# Patient Record
Sex: Male | Born: 1955 | Race: Black or African American | Hispanic: No | Marital: Single | Smoking: Former smoker
Health system: Southern US, Community
[De-identification: ages and names within clinical notes are randomized; demographics above are authoritative.]

## PROBLEM LIST (undated history)

## (undated) DIAGNOSIS — I10 Essential (primary) hypertension: Secondary | ICD-10-CM

## (undated) DIAGNOSIS — I509 Heart failure, unspecified: Secondary | ICD-10-CM

## (undated) DIAGNOSIS — E119 Type 2 diabetes mellitus without complications: Secondary | ICD-10-CM

## (undated) DIAGNOSIS — I4891 Unspecified atrial fibrillation: Secondary | ICD-10-CM

## (undated) DIAGNOSIS — D869 Sarcoidosis, unspecified: Secondary | ICD-10-CM

## (undated) DIAGNOSIS — D6851 Activated protein C resistance: Secondary | ICD-10-CM

## (undated) DIAGNOSIS — Z950 Presence of cardiac pacemaker: Secondary | ICD-10-CM

## (undated) HISTORY — PX: HERNIA REPAIR: SHX51

## (undated) HISTORY — PX: TRANSURETHRAL RESECTION OF PROSTATE: SHX73

## (undated) HISTORY — PX: CARDIAC SURGERY: SHX584

---

## 2004-07-09 ENCOUNTER — Other Ambulatory Visit: Payer: Self-pay

## 2004-07-09 ENCOUNTER — Emergency Department: Payer: Self-pay | Admitting: Emergency Medicine

## 2005-11-10 ENCOUNTER — Emergency Department: Payer: Self-pay | Admitting: Emergency Medicine

## 2007-05-01 ENCOUNTER — Other Ambulatory Visit: Payer: Self-pay

## 2007-05-01 ENCOUNTER — Inpatient Hospital Stay: Payer: Self-pay | Admitting: Internal Medicine

## 2007-05-10 ENCOUNTER — Inpatient Hospital Stay: Payer: Self-pay | Admitting: Internal Medicine

## 2007-05-10 ENCOUNTER — Other Ambulatory Visit: Payer: Self-pay

## 2007-05-11 ENCOUNTER — Other Ambulatory Visit: Payer: Self-pay

## 2007-05-20 ENCOUNTER — Other Ambulatory Visit: Payer: Self-pay

## 2007-05-20 ENCOUNTER — Emergency Department: Payer: Self-pay | Admitting: Unknown Physician Specialty

## 2007-05-27 ENCOUNTER — Emergency Department (HOSPITAL_COMMUNITY): Admission: EM | Admit: 2007-05-27 | Discharge: 2007-05-27 | Payer: Self-pay | Admitting: Emergency Medicine

## 2007-06-04 ENCOUNTER — Other Ambulatory Visit: Payer: Self-pay

## 2007-06-04 ENCOUNTER — Emergency Department: Payer: Self-pay | Admitting: Emergency Medicine

## 2007-06-22 ENCOUNTER — Ambulatory Visit: Payer: Self-pay | Admitting: Vascular Surgery

## 2007-07-15 ENCOUNTER — Other Ambulatory Visit: Payer: Self-pay

## 2007-07-16 ENCOUNTER — Inpatient Hospital Stay: Payer: Self-pay | Admitting: *Deleted

## 2007-07-29 ENCOUNTER — Emergency Department: Payer: Self-pay | Admitting: Emergency Medicine

## 2007-07-29 ENCOUNTER — Other Ambulatory Visit: Payer: Self-pay

## 2007-08-09 ENCOUNTER — Other Ambulatory Visit: Payer: Self-pay

## 2007-08-09 ENCOUNTER — Emergency Department: Payer: Self-pay | Admitting: Internal Medicine

## 2007-08-13 ENCOUNTER — Ambulatory Visit: Payer: Self-pay | Admitting: Vascular Surgery

## 2007-08-14 ENCOUNTER — Emergency Department: Payer: Self-pay | Admitting: Emergency Medicine

## 2007-08-14 ENCOUNTER — Other Ambulatory Visit: Payer: Self-pay

## 2007-08-17 ENCOUNTER — Inpatient Hospital Stay: Payer: Self-pay | Admitting: Vascular Surgery

## 2007-08-23 ENCOUNTER — Other Ambulatory Visit: Payer: Self-pay

## 2007-08-23 ENCOUNTER — Emergency Department: Payer: Self-pay | Admitting: Emergency Medicine

## 2007-08-31 ENCOUNTER — Ambulatory Visit: Payer: Self-pay | Admitting: Vascular Surgery

## 2007-09-06 ENCOUNTER — Emergency Department: Payer: Self-pay | Admitting: Emergency Medicine

## 2007-09-06 ENCOUNTER — Other Ambulatory Visit: Payer: Self-pay

## 2007-10-31 ENCOUNTER — Inpatient Hospital Stay: Payer: Self-pay | Admitting: Internal Medicine

## 2007-10-31 ENCOUNTER — Other Ambulatory Visit: Payer: Self-pay

## 2007-11-01 ENCOUNTER — Other Ambulatory Visit: Payer: Self-pay

## 2007-12-24 ENCOUNTER — Inpatient Hospital Stay: Payer: Self-pay | Admitting: Internal Medicine

## 2008-01-02 ENCOUNTER — Emergency Department: Payer: Self-pay | Admitting: Emergency Medicine

## 2008-01-04 ENCOUNTER — Emergency Department: Payer: Self-pay | Admitting: Emergency Medicine

## 2008-01-05 ENCOUNTER — Inpatient Hospital Stay: Payer: Self-pay | Admitting: Internal Medicine

## 2008-01-08 ENCOUNTER — Emergency Department: Payer: Self-pay

## 2008-01-20 ENCOUNTER — Emergency Department: Payer: Self-pay | Admitting: Emergency Medicine

## 2008-03-16 ENCOUNTER — Emergency Department: Payer: Self-pay

## 2008-08-22 ENCOUNTER — Inpatient Hospital Stay: Payer: Self-pay | Admitting: Internal Medicine

## 2009-08-02 IMAGING — CR DG CHEST 1V PORT
1 series · 1 of 1 positions shown · non-contrast
Comparison: none

REASON FOR EXAM: cp
COMMENTS:

[view not recorded]
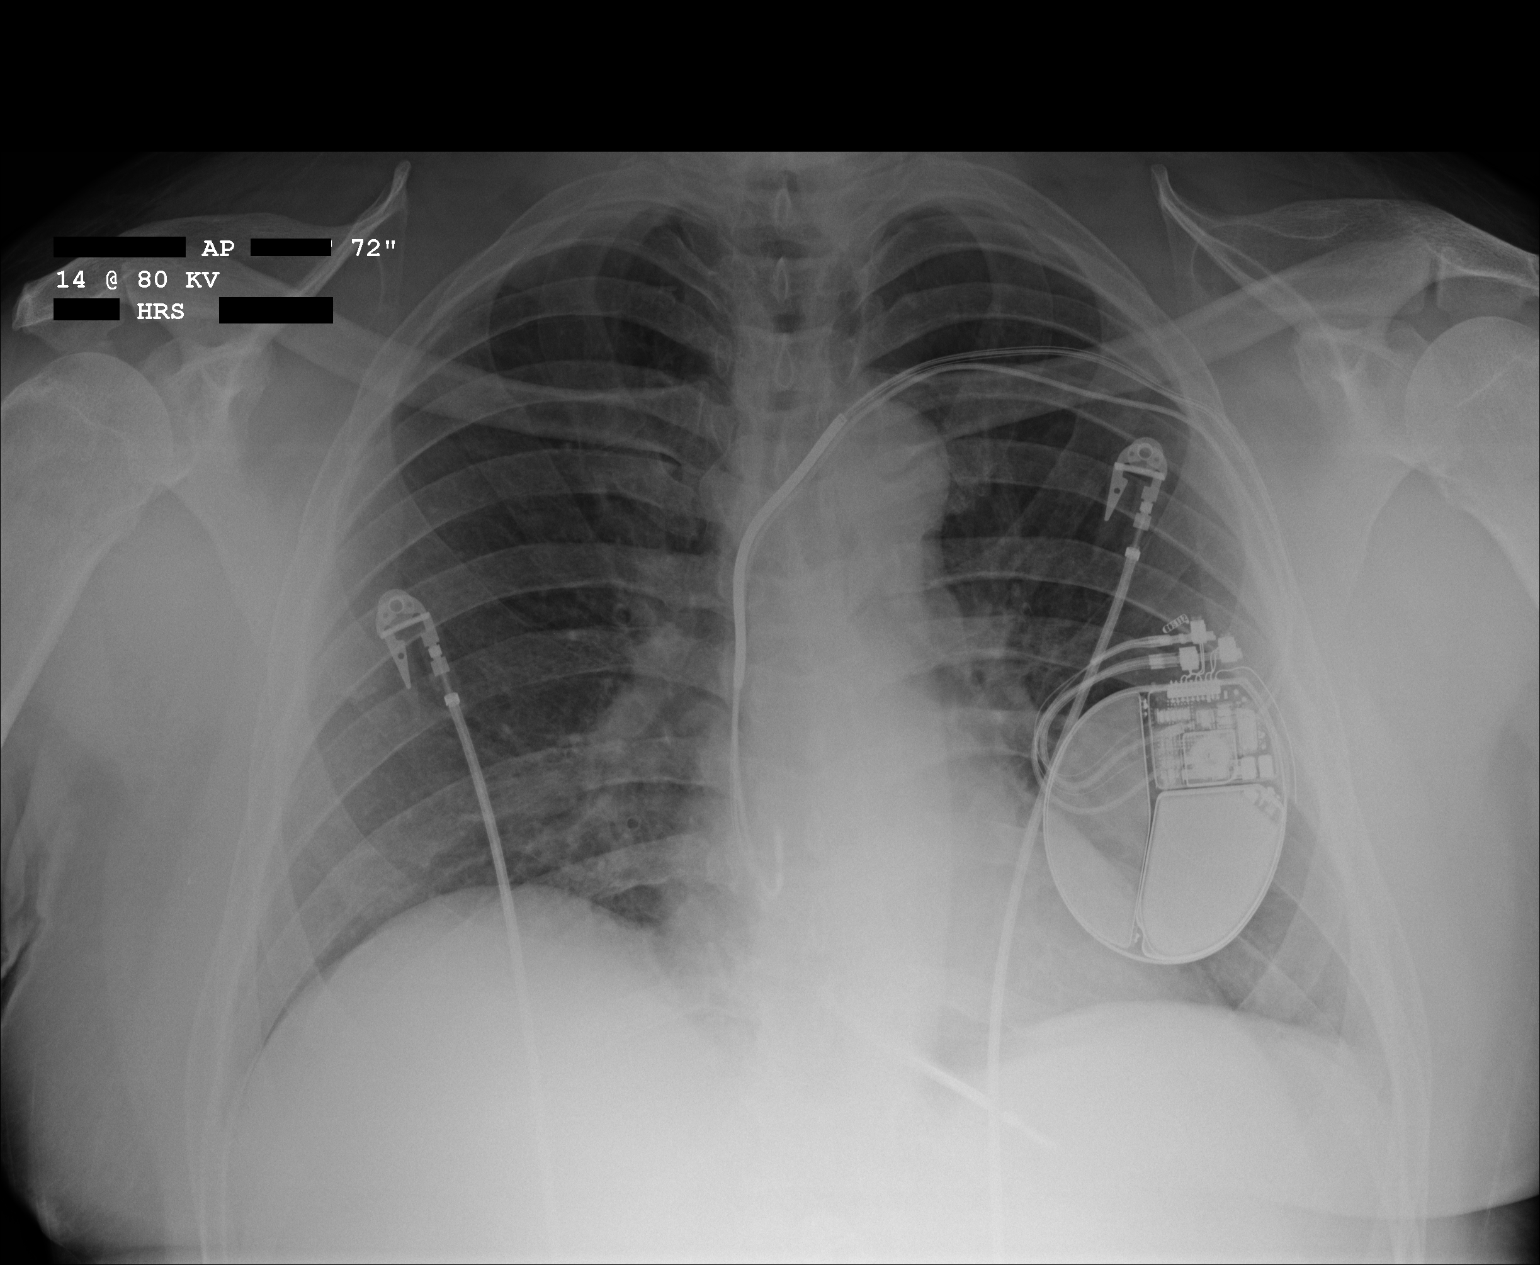

[1 of 1 positions shown; findings below may reference images not displayed]

PROCEDURE:     DXR - DXR PORTABLE CHEST SINGLE VIEW  - August 22, 2008  [DATE]

RESULT:       Comparison is made to a prior study dated 03/16/08.

A left-sided pectoralis pacing unit is appreciated with lead tip projecting
in the region of the right atrium and right ventricle. The cardiac
silhouette is within normal limits.  The visualized bony skeleton is
unremarkable.  There is no evidence of focal infiltrates, effusions or
edema.
IMPRESSION: Chest radiograph without evidence of acute cardiopulmonary disease.

## 2009-09-20 ENCOUNTER — Emergency Department: Payer: Self-pay | Admitting: Emergency Medicine

## 2009-10-15 ENCOUNTER — Inpatient Hospital Stay: Payer: Self-pay | Admitting: Internal Medicine

## 2009-10-24 ENCOUNTER — Emergency Department: Payer: Self-pay | Admitting: Unknown Physician Specialty

## 2009-11-13 ENCOUNTER — Emergency Department: Payer: Self-pay | Admitting: Emergency Medicine

## 2009-11-14 ENCOUNTER — Observation Stay: Payer: Self-pay | Admitting: Vascular Surgery

## 2009-11-25 ENCOUNTER — Emergency Department: Payer: Self-pay | Admitting: Emergency Medicine

## 2010-01-12 ENCOUNTER — Observation Stay: Payer: Self-pay | Admitting: Internal Medicine

## 2010-02-22 ENCOUNTER — Emergency Department: Payer: Self-pay | Admitting: Emergency Medicine

## 2010-02-27 ENCOUNTER — Emergency Department: Payer: Self-pay | Admitting: Emergency Medicine

## 2010-03-06 ENCOUNTER — Emergency Department: Payer: Self-pay | Admitting: Emergency Medicine

## 2010-03-14 ENCOUNTER — Ambulatory Visit: Payer: Self-pay | Admitting: Vascular Surgery

## 2010-03-24 ENCOUNTER — Inpatient Hospital Stay: Payer: Self-pay | Admitting: Internal Medicine

## 2010-04-01 ENCOUNTER — Emergency Department: Payer: Self-pay | Admitting: Emergency Medicine

## 2010-05-08 ENCOUNTER — Emergency Department: Payer: Self-pay

## 2010-05-17 ENCOUNTER — Ambulatory Visit: Payer: Self-pay | Admitting: Vascular Surgery

## 2010-06-14 ENCOUNTER — Inpatient Hospital Stay: Payer: Self-pay | Admitting: Internal Medicine

## 2010-08-04 ENCOUNTER — Observation Stay: Payer: Self-pay | Admitting: Internal Medicine

## 2010-08-20 ENCOUNTER — Emergency Department: Payer: Self-pay | Admitting: Emergency Medicine

## 2010-08-27 ENCOUNTER — Ambulatory Visit: Payer: Self-pay | Admitting: Neurology

## 2010-08-30 ENCOUNTER — Observation Stay: Payer: Self-pay | Admitting: Internal Medicine

## 2010-09-03 ENCOUNTER — Emergency Department: Payer: Self-pay | Admitting: Internal Medicine

## 2010-09-09 ENCOUNTER — Emergency Department: Payer: Self-pay | Admitting: Emergency Medicine

## 2010-09-11 ENCOUNTER — Inpatient Hospital Stay: Payer: Self-pay | Admitting: Student

## 2010-09-26 ENCOUNTER — Observation Stay: Payer: Self-pay | Admitting: Internal Medicine

## 2010-10-20 ENCOUNTER — Emergency Department: Payer: Self-pay | Admitting: Unknown Physician Specialty

## 2010-10-29 LAB — POCT I-STAT, CHEM 8
BUN: 14
Calcium, Ion: 1.19
Chloride: 104
Creatinine, Ser: 1.3
Glucose, Bld: 85
HCT: 42
Hemoglobin: 14.3
Potassium: 3.5
Sodium: 140
TCO2: 26

## 2010-10-29 LAB — CBC
HCT: 40.5
Hemoglobin: 12.7 — ABNORMAL LOW
MCHC: 31.5
MCV: 74.7 — ABNORMAL LOW
Platelets: 303
RBC: 5.42
RDW: 16.1 — ABNORMAL HIGH
WBC: 8.1

## 2010-10-29 LAB — RAPID URINE DRUG SCREEN, HOSP PERFORMED
Amphetamines: NOT DETECTED
Barbiturates: NOT DETECTED
Benzodiazepines: NOT DETECTED
Cocaine: NOT DETECTED
Opiates: NOT DETECTED
Tetrahydrocannabinol: NOT DETECTED

## 2010-10-29 LAB — DIFFERENTIAL
Basophils Absolute: 0.1
Basophils Relative: 2 — ABNORMAL HIGH
Eosinophils Absolute: 0.1
Eosinophils Relative: 1
Lymphocytes Relative: 38
Lymphs Abs: 3.1
Monocytes Absolute: 0.9
Monocytes Relative: 11
Neutro Abs: 3.9
Neutrophils Relative %: 49

## 2010-10-29 LAB — PROTIME-INR
INR: 1.3
Prothrombin Time: 16.2 — ABNORMAL HIGH

## 2010-10-29 LAB — POCT CARDIAC MARKERS
CKMB, poc: 1.2
Myoglobin, poc: 56.6
Operator id: 264031
Troponin i, poc: <0.05

## 2010-10-29 LAB — HEPATIC FUNCTION PANEL
ALT: 21
AST: 23
Albumin: 3.6
Alkaline Phosphatase: 84
Bilirubin, Direct: 0.1
Indirect Bilirubin: 0.3
Total Bilirubin: 0.4
Total Protein: 7.1

## 2010-10-29 LAB — URINALYSIS, ROUTINE W REFLEX MICROSCOPIC
Bilirubin Urine: NEGATIVE
Glucose, UA: NEGATIVE
Hgb urine dipstick: NEGATIVE
Ketones, ur: NEGATIVE
Nitrite: NEGATIVE
Protein, ur: NEGATIVE
Specific Gravity, Urine: 1.01
Urobilinogen, UA: 0.2
pH: 6

## 2010-10-29 LAB — APTT: aPTT: 39 — ABNORMAL HIGH

## 2010-10-29 LAB — LIPASE, BLOOD: Lipase: 39

## 2010-11-13 DIAGNOSIS — Z95810 Presence of automatic (implantable) cardiac defibrillator: Secondary | ICD-10-CM | POA: Insufficient documentation

## 2010-12-19 ENCOUNTER — Inpatient Hospital Stay: Payer: Self-pay | Admitting: Internal Medicine

## 2011-02-02 IMAGING — CR DG TIBIA/FIBULA 2V*L*
1 series · 2 of 2 positions shown · non-contrast
Comparison: None

REASON FOR EXAM: pain
COMMENTS:   May transport without cardiac monitor

PROCEDURE:     DXR - DXR TIBIA AND FIBULA LT (LOWER L  - February 22, 2010  [DATE]
RESULT:     History: Pain

[Series 1: view not recorded · 0.17mm/px · 2 of 2 slices shown]
[im 1/2]
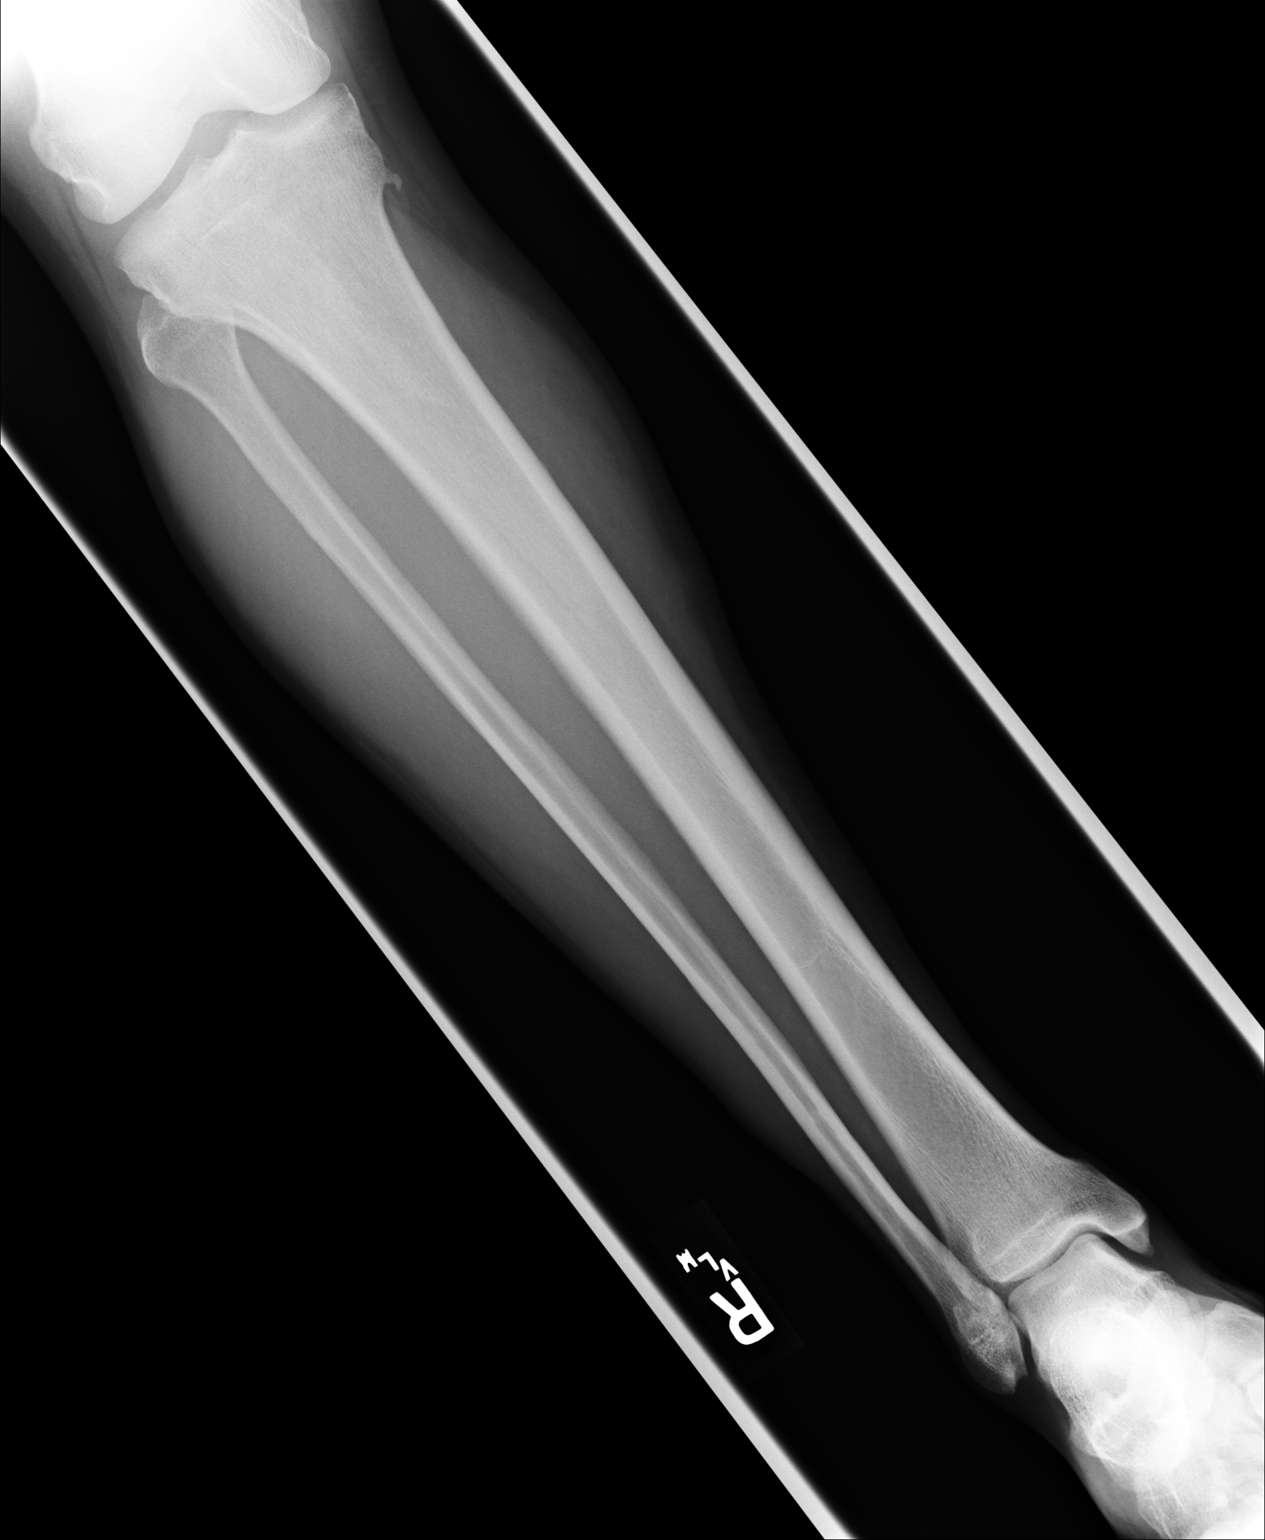
[im 2/2]
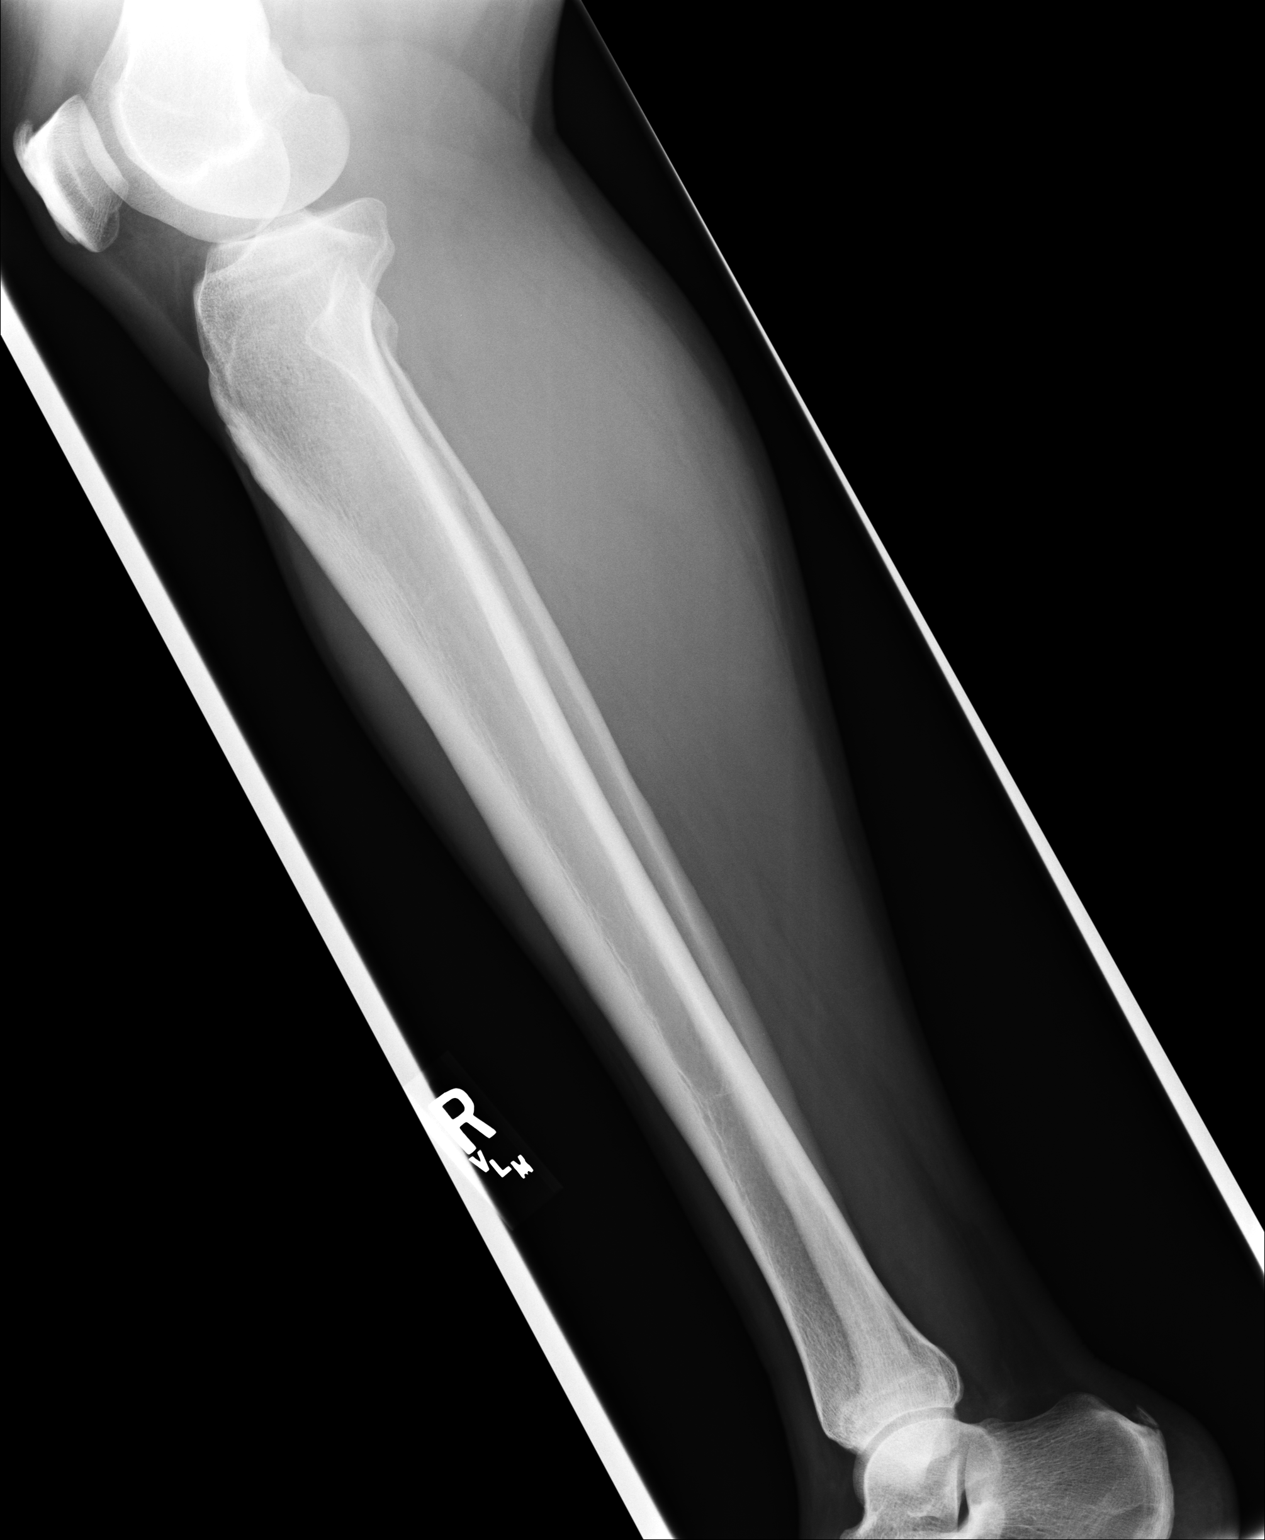

[2 of 2 positions shown; findings below may reference images not displayed]

FINDINGS: AP and lateral views of the right tibia and fibula demonstrates no acute
fracture or dislocation. The soft tissues are unremarkable.
IMPRESSION: No acute osseous injury of the right tibia and fibula.

## 2011-02-07 IMAGING — US US EXTREM LOW VENOUS*R*
1 series · 17 of 24 positions shown · non-contrast
Comparison: none

REASON FOR EXAM: Pain/edema, mild chest pain, Hx of DVT, takes coumadin
COMMENTS:

PROCEDURE:     US  - US DOPPLER LOW EXTR RIGHT  - February 27, 2010 [DATE]
RESULT:     Comparison: None

[Series 1: us extrem low venous*right* · 17 of 24 slices shown]
[im 1/24]
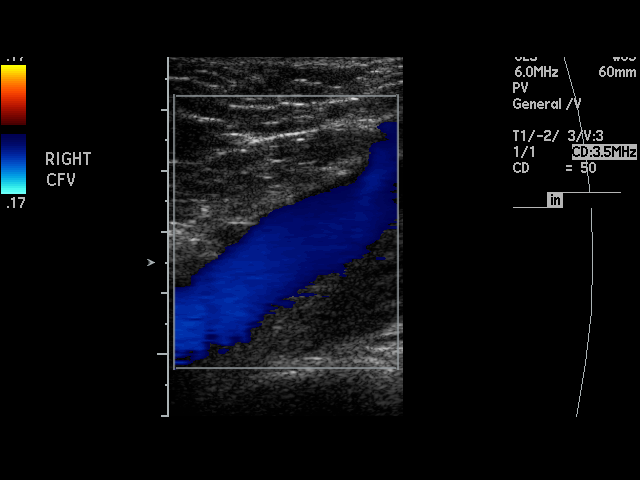
[im 3/24]
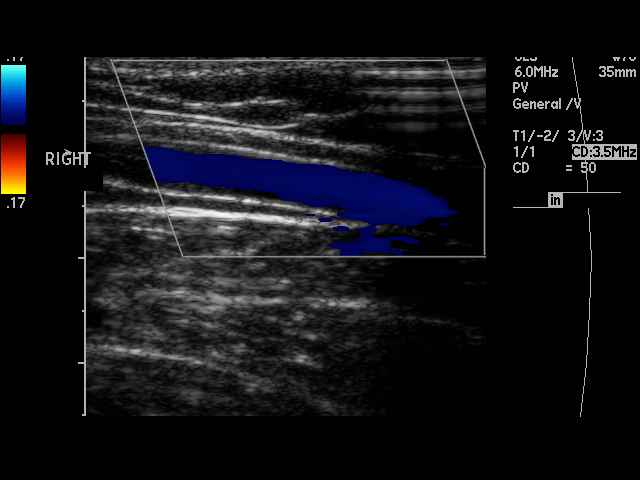
[im 4/24]
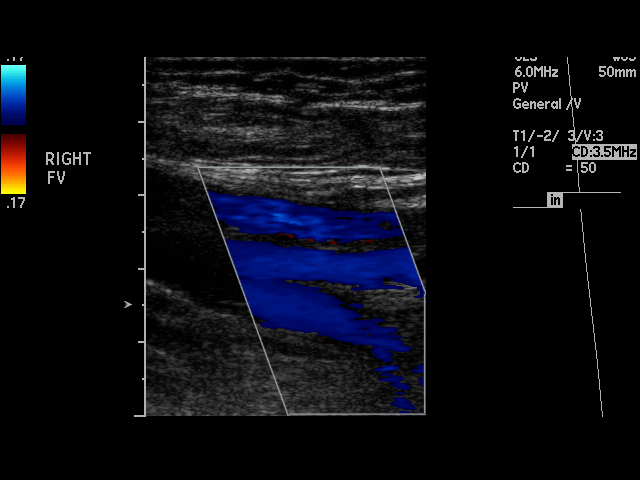
[im 5/24]
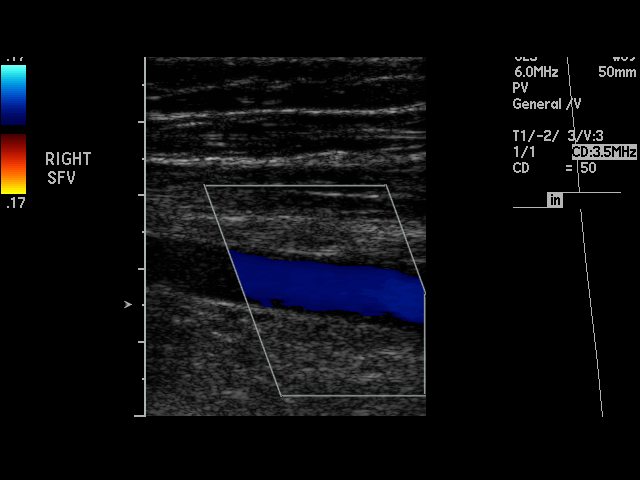
[im 7/24]
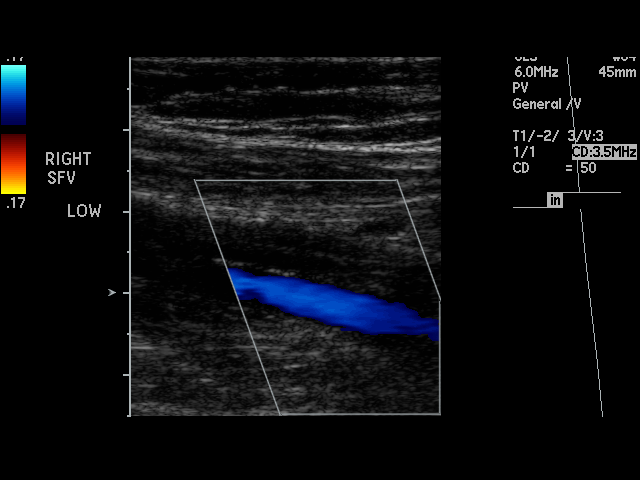
[im 8/24]
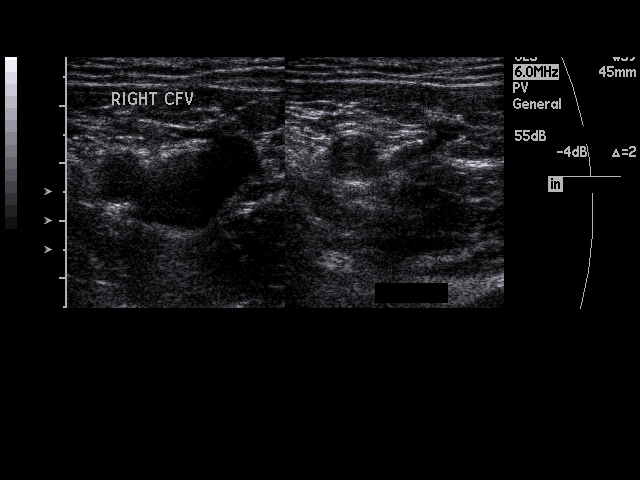
[im 10/24]
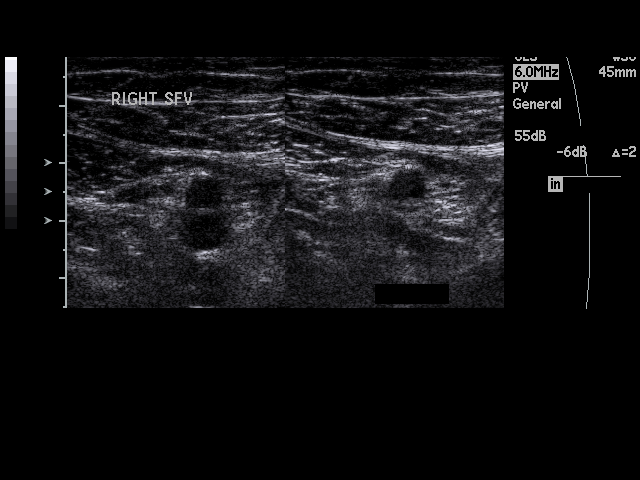
[im 11/24]
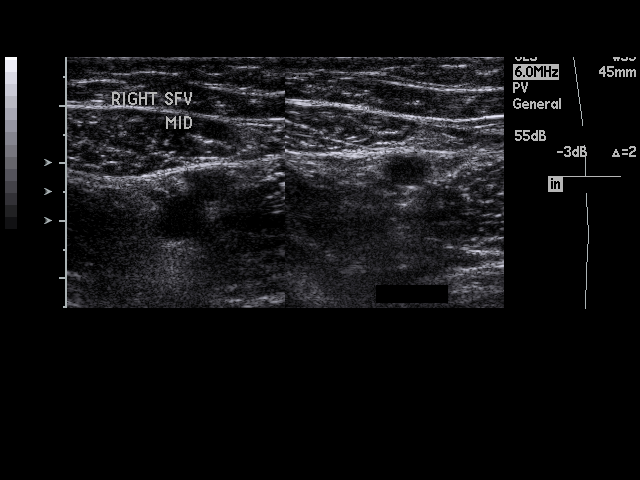
[im 13/24]
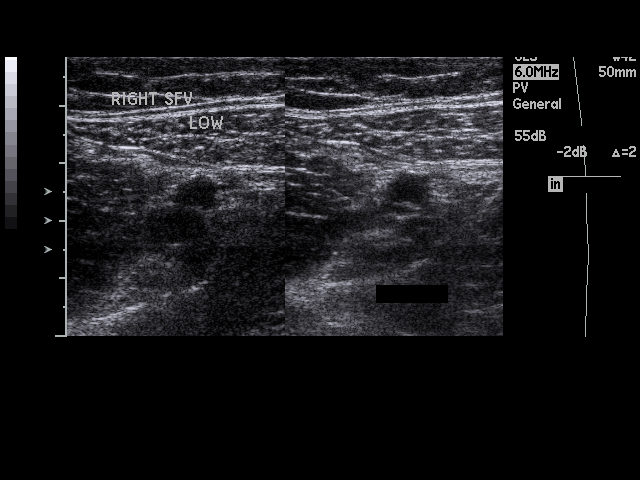
[im 14/24]
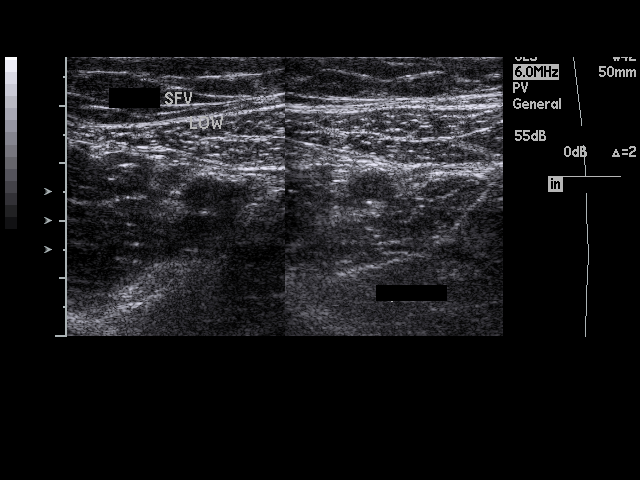
[im 15/24]
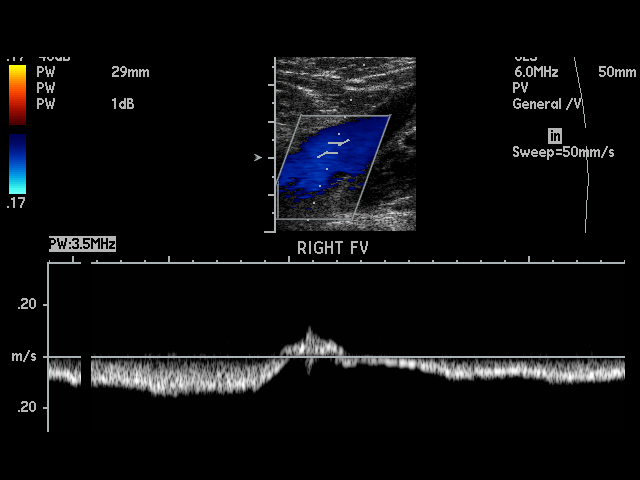
[im 17/24]
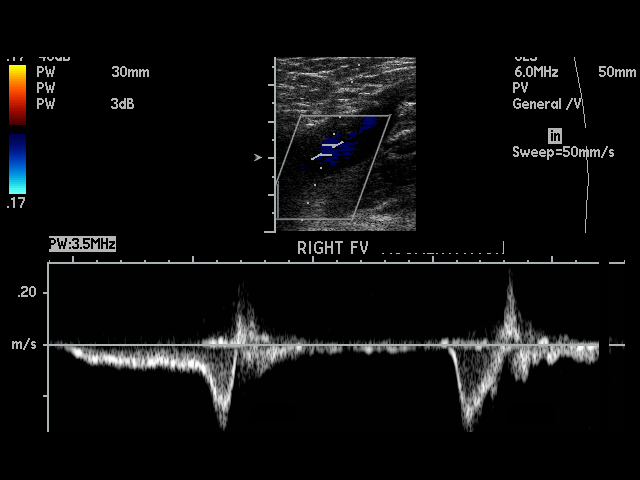
[im 18/24]
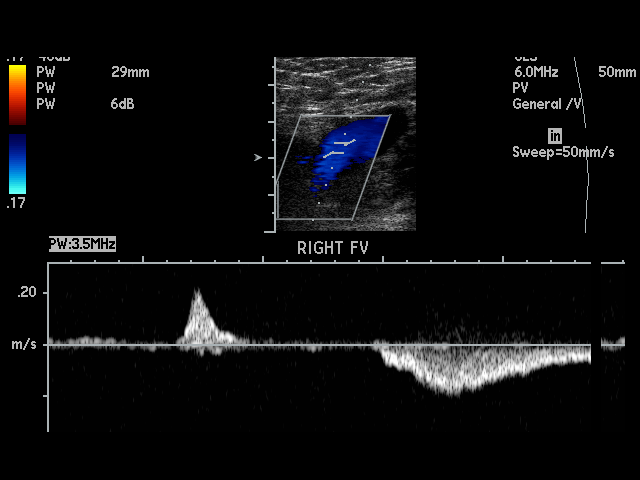
[im 20/24]
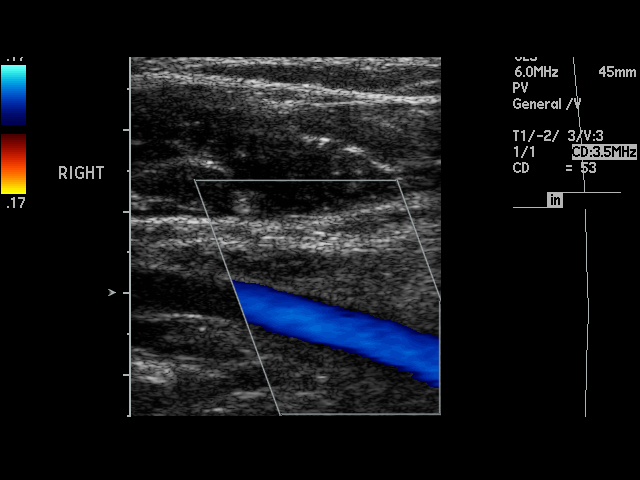
[im 21/24]
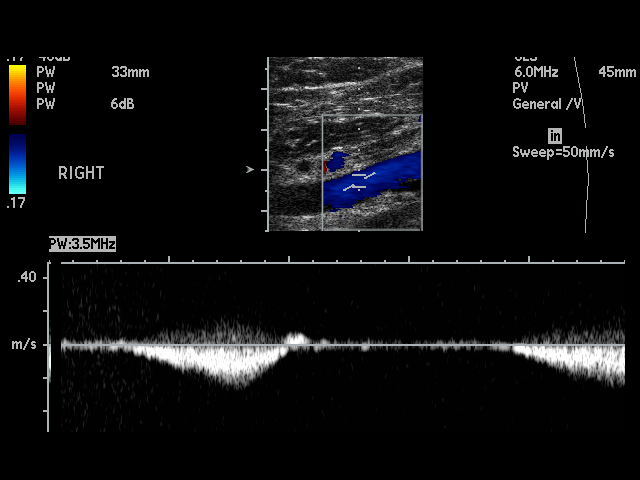
[im 22/24]
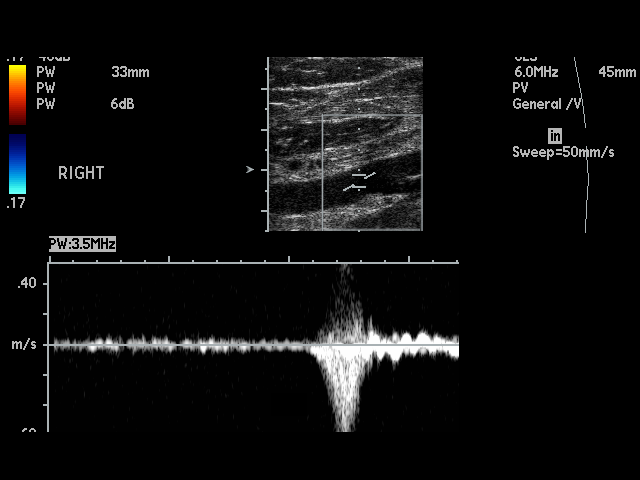
[im 24/24]
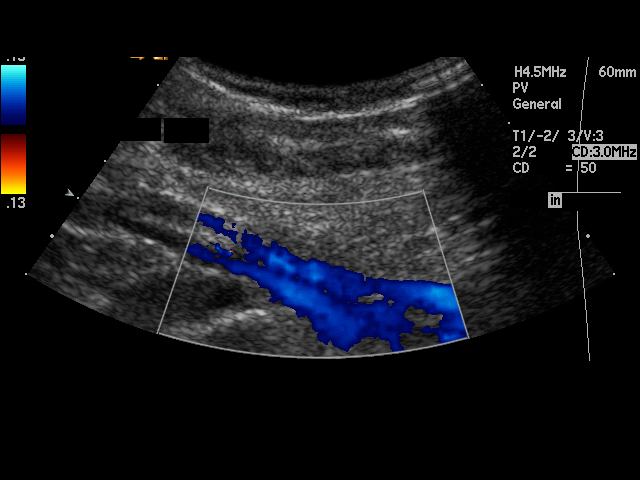

[17 of 24 positions shown; findings below may reference images not displayed]

FINDINGS: Multiple longitudinal and transverse gray-scale as well as color
and spectral Doppler images of the right lower extremity veins were obtained
from the common femoral veins through the popliteal veins.

The right common femoral, greater saphenous, femoral, popliteal veins, and
venous trifurcation are patent, demonstrating normal color-flow and
compressibility. No intraluminal thrombus is identified.There is normal
respiratory variation and augmentation demonstrated at all vein levels.
IMPRESSION: No evidence of DVT in the right lower extremity.

## 2011-02-13 ENCOUNTER — Ambulatory Visit: Payer: Self-pay | Admitting: Internal Medicine

## 2011-02-26 ENCOUNTER — Emergency Department: Payer: Self-pay | Admitting: Emergency Medicine

## 2011-02-26 LAB — URINALYSIS, COMPLETE
Bacteria: NONE SEEN
Bilirubin,UR: NEGATIVE
Blood: NEGATIVE
Glucose,UR: NEGATIVE mg/dL (ref 0–75)
Ketone: NEGATIVE
Leukocyte Esterase: NEGATIVE
Nitrite: NEGATIVE
Ph: 7 (ref 4.5–8.0)
Protein: NEGATIVE
RBC,UR: NONE SEEN /HPF (ref 0–5)
Specific Gravity: 1.003 (ref 1.003–1.030)
Squamous Epithelial: NONE SEEN
WBC UR: <1 /HPF (ref 0–5)

## 2011-02-26 LAB — COMPREHENSIVE METABOLIC PANEL
Albumin: 3.7 g/dL (ref 3.4–5.0)
Alkaline Phosphatase: 103 U/L (ref 50–136)
Anion Gap: 11 (ref 7–16)
BUN: 11 mg/dL (ref 7–18)
Bilirubin,Total: 0.4 mg/dL (ref 0.2–1.0)
Calcium, Total: 8.6 mg/dL (ref 8.5–10.1)
Chloride: 107 mmol/L (ref 98–107)
Co2: 24 mmol/L (ref 21–32)
Creatinine: 0.88 mg/dL (ref 0.60–1.30)
EGFR (African American): >60
EGFR (Non-African Amer.): >60
Glucose: 106 mg/dL — ABNORMAL HIGH (ref 65–99)
Osmolality: 283 (ref 275–301)
Potassium: 3.9 mmol/L (ref 3.5–5.1)
SGOT(AST): 29 U/L (ref 15–37)
SGPT (ALT): 23 U/L
Sodium: 142 mmol/L (ref 136–145)
Total Protein: 7.7 g/dL (ref 6.4–8.2)

## 2011-02-26 LAB — CK TOTAL AND CKMB (NOT AT ARMC)
CK, Total: 349 U/L — ABNORMAL HIGH (ref 35–232)
CK, Total: 305 U/L — ABNORMAL HIGH (ref 35–232)
CK-MB: 2.8 ng/mL (ref 0.5–3.6)
CK-MB: 2.1 ng/mL (ref 0.5–3.6)

## 2011-02-26 LAB — TROPONIN I
Troponin-I: <0.02 ng/mL
Troponin-I: <0.02 ng/mL

## 2011-02-26 LAB — CBC
HCT: 31.6 % — ABNORMAL LOW (ref 40.0–52.0)
HGB: 9.5 g/dL — ABNORMAL LOW (ref 13.0–18.0)
MCH: 17.2 pg — ABNORMAL LOW (ref 26.0–34.0)
MCHC: 30 g/dL — ABNORMAL LOW (ref 32.0–36.0)
MCV: 57 fL — ABNORMAL LOW (ref 80–100)
Platelet: 317 10*3/uL (ref 150–440)
RBC: 5.53 10*6/uL (ref 4.40–5.90)
RDW: 20.7 % — ABNORMAL HIGH (ref 11.5–14.5)
WBC: 8.8 10*3/uL (ref 3.8–10.6)

## 2011-02-26 LAB — PRO B NATRIURETIC PEPTIDE: B-Type Natriuretic Peptide: 93 pg/mL (ref 0–125)

## 2011-03-23 LAB — CBC
HCT: 26.8 % — ABNORMAL LOW (ref 40.0–52.0)
HGB: 7.8 g/dL — ABNORMAL LOW (ref 13.0–18.0)
MCH: 16.5 pg — ABNORMAL LOW (ref 26.0–34.0)
MCHC: 29.3 g/dL — ABNORMAL LOW (ref 32.0–36.0)
MCV: 56 fL — ABNORMAL LOW (ref 80–100)
Platelet: 347 10*3/uL (ref 150–440)
RBC: 4.74 10*6/uL (ref 4.40–5.90)
RDW: 23.1 % — ABNORMAL HIGH (ref 11.5–14.5)
WBC: 8 10*3/uL (ref 3.8–10.6)

## 2011-03-24 ENCOUNTER — Inpatient Hospital Stay: Payer: Self-pay | Admitting: Internal Medicine

## 2011-03-24 LAB — COMPREHENSIVE METABOLIC PANEL
Albumin: 3.6 g/dL (ref 3.4–5.0)
Alkaline Phosphatase: 78 U/L (ref 50–136)
Anion Gap: 10 (ref 7–16)
BUN: 16 mg/dL (ref 7–18)
Bilirubin,Total: 0.4 mg/dL (ref 0.2–1.0)
Calcium, Total: 8.2 mg/dL — ABNORMAL LOW (ref 8.5–10.1)
Chloride: 106 mmol/L (ref 98–107)
Co2: 24 mmol/L (ref 21–32)
Creatinine: 1.01 mg/dL (ref 0.60–1.30)
EGFR (African American): >60
EGFR (Non-African Amer.): >60
Glucose: 91 mg/dL (ref 65–99)
Osmolality: 280 (ref 275–301)
Potassium: 3.9 mmol/L (ref 3.5–5.1)
SGOT(AST): 28 U/L (ref 15–37)
SGPT (ALT): 22 U/L
Sodium: 140 mmol/L (ref 136–145)
Total Protein: 7.4 g/dL (ref 6.4–8.2)

## 2011-03-24 LAB — TROPONIN I
Troponin-I: <0.02 ng/mL
Troponin-I: <0.02 ng/mL
Troponin-I: <0.02 ng/mL

## 2011-03-24 LAB — CK TOTAL AND CKMB (NOT AT ARMC)
CK, Total: 363 U/L — ABNORMAL HIGH (ref 35–232)
CK, Total: 287 U/L — ABNORMAL HIGH (ref 35–232)
CK, Total: 259 U/L — ABNORMAL HIGH (ref 35–232)
CK-MB: 1.9 ng/mL (ref 0.5–3.6)
CK-MB: 1.5 ng/mL (ref 0.5–3.6)
CK-MB: 1.4 ng/mL (ref 0.5–3.6)

## 2011-03-24 LAB — PROTIME-INR
INR: 3.5
Prothrombin Time: 35 secs — ABNORMAL HIGH (ref 11.5–14.7)

## 2011-03-25 LAB — CBC WITH DIFFERENTIAL/PLATELET
Basophil #: 0.1 10*3/uL (ref 0.0–0.1)
Basophil %: 1.1 %
Eosinophil #: 0.2 10*3/uL (ref 0.0–0.7)
Eosinophil %: 2.9 %
HCT: 33.4 % — ABNORMAL LOW (ref 40.0–52.0)
HGB: 10 g/dL — ABNORMAL LOW (ref 13.0–18.0)
Lymphocyte #: 2.2 10*3/uL (ref 1.0–3.6)
Lymphocyte %: 30.9 %
MCH: 19 pg — ABNORMAL LOW (ref 26.0–34.0)
MCHC: 30 g/dL — ABNORMAL LOW (ref 32.0–36.0)
MCV: 63 fL — ABNORMAL LOW (ref 80–100)
Monocyte #: 0.8 10*3/uL — ABNORMAL HIGH (ref 0.0–0.7)
Monocyte %: 10.7 %
Neutrophil #: 3.9 10*3/uL (ref 1.4–6.5)
Neutrophil %: 54.4 %
Platelet: 326 10*3/uL (ref 150–440)
RBC: 5.28 10*6/uL (ref 4.40–5.90)
RDW: 31.3 % — ABNORMAL HIGH (ref 11.5–14.5)
WBC: 7.3 10*3/uL (ref 3.8–10.6)

## 2011-03-25 LAB — BASIC METABOLIC PANEL
Anion Gap: 5 — ABNORMAL LOW (ref 7–16)
BUN: 12 mg/dL (ref 7–18)
Calcium, Total: 8.4 mg/dL — ABNORMAL LOW (ref 8.5–10.1)
Chloride: 103 mmol/L (ref 98–107)
Co2: 27 mmol/L (ref 21–32)
Creatinine: 1.16 mg/dL (ref 0.60–1.30)
EGFR (African American): >60
EGFR (Non-African Amer.): >60
Glucose: 102 mg/dL — ABNORMAL HIGH (ref 65–99)
Osmolality: 270 (ref 275–301)
Potassium: 4.1 mmol/L (ref 3.5–5.1)
Sodium: 135 mmol/L — ABNORMAL LOW (ref 136–145)

## 2011-03-25 LAB — PROTIME-INR
INR: 3
Prothrombin Time: 31.1 secs — ABNORMAL HIGH (ref 11.5–14.7)

## 2011-03-25 LAB — LIPID PANEL
Cholesterol: 110 mg/dL (ref 0–200)
HDL Cholesterol: 22 mg/dL — ABNORMAL LOW (ref 40–60)
Ldl Cholesterol, Calc: 49 mg/dL (ref 0–100)
Triglycerides: 196 mg/dL (ref 0–200)
VLDL Cholesterol, Calc: 39 mg/dL (ref 5–40)

## 2011-03-25 LAB — MAGNESIUM: Magnesium: 1.8 mg/dL

## 2011-03-25 LAB — TSH: Thyroid Stimulating Horm: 1.04 u[IU]/mL

## 2011-03-25 LAB — HEMOGLOBIN A1C: Hemoglobin A1C: 5.9 % (ref 4.2–6.3)

## 2011-03-26 LAB — CBC WITH DIFFERENTIAL/PLATELET
Basophil #: 0.1 10*3/uL (ref 0.0–0.1)
Basophil %: 0.7 %
Eosinophil #: 0.2 10*3/uL (ref 0.0–0.7)
Eosinophil %: 2.3 %
HCT: 34.4 % — ABNORMAL LOW (ref 40.0–52.0)
HGB: 10.2 g/dL — ABNORMAL LOW (ref 13.0–18.0)
Lymphocyte #: 2.4 10*3/uL (ref 1.0–3.6)
Lymphocyte %: 33.5 %
MCH: 18.9 pg — ABNORMAL LOW (ref 26.0–34.0)
MCHC: 29.8 g/dL — ABNORMAL LOW (ref 32.0–36.0)
MCV: 64 fL — ABNORMAL LOW (ref 80–100)
Monocyte #: 0.7 10*3/uL (ref 0.0–0.7)
Monocyte %: 10 %
Neutrophil #: 3.8 10*3/uL (ref 1.4–6.5)
Neutrophil %: 53.5 %
Platelet: 308 10*3/uL (ref 150–440)
RBC: 5.4 10*6/uL (ref 4.40–5.90)
RDW: 31.9 % — ABNORMAL HIGH (ref 11.5–14.5)
WBC: 7.1 10*3/uL (ref 3.8–10.6)

## 2011-03-26 LAB — PROTIME-INR
INR: 2.2
Prothrombin Time: 24.5 secs — ABNORMAL HIGH (ref 11.5–14.7)

## 2011-04-11 ENCOUNTER — Ambulatory Visit: Payer: Self-pay | Admitting: Internal Medicine

## 2011-04-11 LAB — COMPREHENSIVE METABOLIC PANEL
Albumin: 3.8 g/dL (ref 3.4–5.0)
Alkaline Phosphatase: 82 U/L (ref 50–136)
Anion Gap: 11 (ref 7–16)
BUN: 9 mg/dL (ref 7–18)
Bilirubin,Total: 0.5 mg/dL (ref 0.2–1.0)
Calcium, Total: 8.5 mg/dL (ref 8.5–10.1)
Chloride: 105 mmol/L (ref 98–107)
Co2: 26 mmol/L (ref 21–32)
Creatinine: 1.16 mg/dL (ref 0.60–1.30)
EGFR (African American): >60
EGFR (Non-African Amer.): >60
Glucose: 82 mg/dL (ref 65–99)
Osmolality: 281 (ref 275–301)
Potassium: 3.7 mmol/L (ref 3.5–5.1)
SGOT(AST): 24 U/L (ref 15–37)
SGPT (ALT): 21 U/L
Sodium: 142 mmol/L (ref 136–145)
Total Protein: 7.6 g/dL (ref 6.4–8.2)

## 2011-04-11 LAB — CBC WITH DIFFERENTIAL/PLATELET
Basophil #: 0.1 10*3/uL (ref 0.0–0.1)
Basophil %: 0.9 %
Eosinophil #: 0.1 10*3/uL (ref 0.0–0.7)
Eosinophil %: 1.1 %
HCT: 38 % — ABNORMAL LOW (ref 40.0–52.0)
HGB: 11.5 g/dL — ABNORMAL LOW (ref 13.0–18.0)
Lymphocyte #: 2.1 10*3/uL (ref 1.0–3.6)
Lymphocyte %: 23.5 %
MCH: 19.5 pg — ABNORMAL LOW (ref 26.0–34.0)
MCHC: 30.4 g/dL — ABNORMAL LOW (ref 32.0–36.0)
MCV: 64 fL — ABNORMAL LOW (ref 80–100)
Monocyte #: 1 10*3/uL — ABNORMAL HIGH (ref 0.0–0.7)
Monocyte %: 11.3 %
Neutrophil #: 5.6 10*3/uL (ref 1.4–6.5)
Neutrophil %: 63.2 %
Platelet: 249 10*3/uL (ref 150–440)
RBC: 5.91 10*6/uL — ABNORMAL HIGH (ref 4.40–5.90)
RDW: 31.6 % — ABNORMAL HIGH (ref 11.5–14.5)
WBC: 8.9 10*3/uL (ref 3.8–10.6)

## 2011-04-11 LAB — PRO B NATRIURETIC PEPTIDE: B-Type Natriuretic Peptide: 74 pg/mL (ref 0–125)

## 2011-04-11 LAB — CK: CK, Total: 261 U/L — ABNORMAL HIGH (ref 35–232)

## 2011-04-11 LAB — TROPONIN I: Troponin-I: <0.02 ng/mL

## 2011-04-27 ENCOUNTER — Inpatient Hospital Stay: Payer: Self-pay | Admitting: Internal Medicine

## 2011-04-27 LAB — CBC
HCT: 36.3 % — ABNORMAL LOW (ref 40.0–52.0)
HGB: 11 g/dL — ABNORMAL LOW (ref 13.0–18.0)
MCH: 19.3 pg — ABNORMAL LOW (ref 26.0–34.0)
MCHC: 30.3 g/dL — ABNORMAL LOW (ref 32.0–36.0)
MCV: 64 fL — ABNORMAL LOW (ref 80–100)
Platelet: 228 10*3/uL (ref 150–440)
RBC: 5.69 10*6/uL (ref 4.40–5.90)
RDW: 30.3 % — ABNORMAL HIGH (ref 11.5–14.5)
WBC: 10.9 10*3/uL — ABNORMAL HIGH (ref 3.8–10.6)

## 2011-04-27 LAB — ETHANOL
Ethanol %: <0.003 % (ref 0.000–0.080)
Ethanol: <3 mg/dL

## 2011-04-27 LAB — COMPREHENSIVE METABOLIC PANEL
Albumin: 4 g/dL (ref 3.4–5.0)
Alkaline Phosphatase: 90 U/L (ref 50–136)
Anion Gap: 14 (ref 7–16)
BUN: 9 mg/dL (ref 7–18)
Bilirubin,Total: 0.5 mg/dL (ref 0.2–1.0)
Calcium, Total: 8.7 mg/dL (ref 8.5–10.1)
Chloride: 103 mmol/L (ref 98–107)
Co2: 22 mmol/L (ref 21–32)
Creatinine: 1.15 mg/dL (ref 0.60–1.30)
EGFR (African American): >60
EGFR (Non-African Amer.): >60
Glucose: 100 mg/dL — ABNORMAL HIGH (ref 65–99)
Osmolality: 276 (ref 275–301)
Potassium: 3.4 mmol/L — ABNORMAL LOW (ref 3.5–5.1)
SGOT(AST): 38 U/L — ABNORMAL HIGH (ref 15–37)
SGPT (ALT): 26 U/L
Sodium: 139 mmol/L (ref 136–145)
Total Protein: 7.7 g/dL (ref 6.4–8.2)

## 2011-04-27 LAB — URINALYSIS, COMPLETE
Bacteria: NONE SEEN
Bilirubin,UR: NEGATIVE
Blood: NEGATIVE
Glucose,UR: NEGATIVE mg/dL
Ketone: NEGATIVE
Leukocyte Esterase: NEGATIVE
Nitrite: NEGATIVE
Ph: 6
Protein: NEGATIVE
RBC,UR: 1 /HPF
Specific Gravity: 1.004
Squamous Epithelial: NONE SEEN
WBC UR: 1 /HPF

## 2011-04-27 LAB — DRUG SCREEN, URINE
Amphetamines, Ur Screen: NEGATIVE
Barbiturates, Ur Screen: NEGATIVE
Benzodiazepine, Ur Scrn: NEGATIVE
Cannabinoid 50 Ng, Ur ~~LOC~~: NEGATIVE
Cocaine Metabolite,Ur ~~LOC~~: NEGATIVE
MDMA (Ecstasy)Ur Screen: NEGATIVE
Methadone, Ur Screen: NEGATIVE
Opiate, Ur Screen: NEGATIVE
Phencyclidine (PCP) Ur S: NEGATIVE
Tricyclic, Ur Screen: NEGATIVE

## 2011-04-27 LAB — CK: CK, Total: 850 U/L — ABNORMAL HIGH (ref 35–232)

## 2011-04-27 LAB — PROTIME-INR
INR: 2.4
Prothrombin Time: 26.3 secs — ABNORMAL HIGH (ref 11.5–14.7)

## 2011-04-27 LAB — TROPONIN I: Troponin-I: <0.02 ng/mL

## 2011-04-27 LAB — CK-MB: CK-MB: 5.3 ng/mL — ABNORMAL HIGH

## 2011-04-28 LAB — CBC WITH DIFFERENTIAL/PLATELET
Basophil #: 0 10*3/uL (ref 0.0–0.1)
Basophil %: 0.3 %
Eosinophil #: 0.1 10*3/uL (ref 0.0–0.7)
Eosinophil %: 1.9 %
HCT: 34.6 % — ABNORMAL LOW (ref 40.0–52.0)
HGB: 10.3 g/dL — ABNORMAL LOW (ref 13.0–18.0)
Lymphocyte #: 2.7 10*3/uL (ref 1.0–3.6)
Lymphocyte %: 38.5 %
MCH: 19.1 pg — ABNORMAL LOW (ref 26.0–34.0)
MCHC: 29.8 g/dL — ABNORMAL LOW (ref 32.0–36.0)
MCV: 64 fL — ABNORMAL LOW (ref 80–100)
Monocyte #: 0.6 10*3/uL (ref 0.0–0.7)
Monocyte %: 8.4 %
Neutrophil #: 3.5 10*3/uL (ref 1.4–6.5)
Neutrophil %: 50.9 %
Platelet: 236 10*3/uL (ref 150–440)
RBC: 5.41 10*6/uL (ref 4.40–5.90)
RDW: 30.4 % — ABNORMAL HIGH (ref 11.5–14.5)
WBC: 6.9 10*3/uL (ref 3.8–10.6)

## 2011-04-28 LAB — COMPREHENSIVE METABOLIC PANEL
Albumin: 3.2 g/dL — ABNORMAL LOW (ref 3.4–5.0)
Alkaline Phosphatase: 82 U/L (ref 50–136)
Anion Gap: 10 (ref 7–16)
BUN: 9 mg/dL (ref 7–18)
Bilirubin,Total: 0.3 mg/dL (ref 0.2–1.0)
Calcium, Total: 8.2 mg/dL — ABNORMAL LOW (ref 8.5–10.1)
Chloride: 106 mmol/L (ref 98–107)
Co2: 25 mmol/L (ref 21–32)
Creatinine: 1.06 mg/dL (ref 0.60–1.30)
EGFR (African American): >60
EGFR (Non-African Amer.): >60
Glucose: 115 mg/dL — ABNORMAL HIGH (ref 65–99)
Osmolality: 281 (ref 275–301)
Potassium: 3.7 mmol/L (ref 3.5–5.1)
SGOT(AST): 23 U/L (ref 15–37)
SGPT (ALT): 23 U/L
Sodium: 141 mmol/L (ref 136–145)
Total Protein: 6.5 g/dL (ref 6.4–8.2)

## 2011-04-28 LAB — TROPONIN I
Troponin-I: <0.02 ng/mL
Troponin-I: <0.02 ng/mL

## 2011-04-28 LAB — CK TOTAL AND CKMB (NOT AT ARMC)
CK, Total: 573 U/L — ABNORMAL HIGH (ref 35–232)
CK, Total: 431 U/L — ABNORMAL HIGH (ref 35–232)
CK-MB: 3.3 ng/mL (ref 0.5–3.6)
CK-MB: 2.5 ng/mL (ref 0.5–3.6)

## 2011-04-29 LAB — PROTIME-INR
INR: 3.5
Prothrombin Time: 34.9 secs — ABNORMAL HIGH (ref 11.5–14.7)

## 2011-05-19 DIAGNOSIS — Z8673 Personal history of transient ischemic attack (TIA), and cerebral infarction without residual deficits: Secondary | ICD-10-CM | POA: Insufficient documentation

## 2011-06-04 ENCOUNTER — Observation Stay: Payer: Self-pay | Admitting: Internal Medicine

## 2011-06-04 LAB — COMPREHENSIVE METABOLIC PANEL
Albumin: 3.6 g/dL (ref 3.4–5.0)
Alkaline Phosphatase: 105 U/L (ref 50–136)
Anion Gap: 7 (ref 7–16)
BUN: 9 mg/dL (ref 7–18)
Bilirubin,Total: 0.4 mg/dL (ref 0.2–1.0)
Calcium, Total: 8.4 mg/dL — ABNORMAL LOW (ref 8.5–10.1)
Chloride: 108 mmol/L — ABNORMAL HIGH (ref 98–107)
Co2: 26 mmol/L (ref 21–32)
Creatinine: 0.95 mg/dL (ref 0.60–1.30)
EGFR (African American): >60
EGFR (Non-African Amer.): >60
Glucose: 91 mg/dL (ref 65–99)
Osmolality: 280 (ref 275–301)
Potassium: 3.7 mmol/L (ref 3.5–5.1)
SGOT(AST): 23 U/L (ref 15–37)
SGPT (ALT): 24 U/L
Sodium: 141 mmol/L (ref 136–145)
Total Protein: 7.5 g/dL (ref 6.4–8.2)

## 2011-06-04 LAB — CBC
HCT: 35.2 % — ABNORMAL LOW (ref 40.0–52.0)
HGB: 10.5 g/dL — ABNORMAL LOW (ref 13.0–18.0)
MCH: 18.7 pg — ABNORMAL LOW (ref 26.0–34.0)
MCHC: 29.7 g/dL — ABNORMAL LOW (ref 32.0–36.0)
MCV: 63 fL — ABNORMAL LOW (ref 80–100)
Platelet: 288 10*3/uL (ref 150–440)
RBC: 5.58 10*6/uL (ref 4.40–5.90)
RDW: 24.2 % — ABNORMAL HIGH (ref 11.5–14.5)
WBC: 9.5 10*3/uL (ref 3.8–10.6)

## 2011-06-04 LAB — TROPONIN I: Troponin-I: <0.02 ng/mL

## 2011-06-04 LAB — PROTIME-INR
INR: 1.7
Prothrombin Time: 20.1 secs — ABNORMAL HIGH (ref 11.5–14.7)

## 2011-06-04 LAB — MAGNESIUM: Magnesium: 1.7 mg/dL — ABNORMAL LOW

## 2011-06-04 LAB — PRO B NATRIURETIC PEPTIDE: B-Type Natriuretic Peptide: 79 pg/mL (ref 0–125)

## 2011-06-05 LAB — PROTIME-INR
INR: 1.8
Prothrombin Time: 21.4 secs — ABNORMAL HIGH (ref 11.5–14.7)

## 2011-06-06 LAB — BETA STREP CULTURE(ARMC)

## 2011-06-19 ENCOUNTER — Ambulatory Visit: Payer: Self-pay | Admitting: Family

## 2011-06-26 ENCOUNTER — Emergency Department: Payer: Self-pay | Admitting: Emergency Medicine

## 2011-08-20 ENCOUNTER — Ambulatory Visit: Payer: Self-pay | Admitting: Family

## 2011-08-20 LAB — CREATININE, SERUM
Creatinine: 1.09 mg/dL (ref 0.60–1.30)
EGFR (African American): >60
EGFR (Non-African Amer.): >60

## 2011-08-23 ENCOUNTER — Observation Stay: Payer: Self-pay | Admitting: Internal Medicine

## 2011-08-23 LAB — BASIC METABOLIC PANEL
Anion Gap: 8 (ref 7–16)
BUN: 10 mg/dL (ref 7–18)
Calcium, Total: 8.4 mg/dL — ABNORMAL LOW (ref 8.5–10.1)
Chloride: 109 mmol/L — ABNORMAL HIGH (ref 98–107)
Co2: 25 mmol/L (ref 21–32)
Creatinine: 0.98 mg/dL (ref 0.60–1.30)
EGFR (African American): >60
EGFR (Non-African Amer.): >60
Glucose: 73 mg/dL (ref 65–99)
Osmolality: 281 (ref 275–301)
Potassium: 3.8 mmol/L (ref 3.5–5.1)
Sodium: 142 mmol/L (ref 136–145)

## 2011-08-23 LAB — TROPONIN I
Troponin-I: <0.02 ng/mL
Troponin-I: <0.02 ng/mL

## 2011-08-23 LAB — CBC
HCT: 36.3 % — ABNORMAL LOW (ref 40.0–52.0)
HGB: 10.4 g/dL — ABNORMAL LOW (ref 13.0–18.0)
MCH: 17.7 pg — ABNORMAL LOW (ref 26.0–34.0)
MCHC: 28.8 g/dL — ABNORMAL LOW (ref 32.0–36.0)
MCV: 62 fL — ABNORMAL LOW (ref 80–100)
Platelet: 250 10*3/uL (ref 150–440)
RBC: 5.9 10*6/uL (ref 4.40–5.90)
RDW: 21.1 % — ABNORMAL HIGH (ref 11.5–14.5)
WBC: 6 10*3/uL (ref 3.8–10.6)

## 2011-08-23 LAB — CK TOTAL AND CKMB (NOT AT ARMC)
CK, Total: 245 U/L — ABNORMAL HIGH (ref 35–232)
CK, Total: 218 U/L (ref 35–232)
CK-MB: 2.1 ng/mL (ref 0.5–3.6)
CK-MB: 1.8 ng/mL (ref 0.5–3.6)

## 2011-08-23 LAB — PRO B NATRIURETIC PEPTIDE: B-Type Natriuretic Peptide: 142 pg/mL — ABNORMAL HIGH (ref 0–125)

## 2011-08-23 LAB — PROTIME-INR
INR: 2.5
Prothrombin Time: 27.5 secs — ABNORMAL HIGH (ref 11.5–14.7)

## 2011-08-23 IMAGING — US US EXTREM UP VENOUS*R*
1 series · 17 of 24 positions shown · non-contrast
Comparison: none

REASON FOR EXAM: Rule out DVT
COMMENTS:

PROCEDURE:     US  - US DOPPLER UP EXTR RIGHT  - September 12, 2010  [DATE]
RESULT:     The vascularity of the right upper extremity was evaluated using
color flow doctor. Normal color flow and waveforms were seen from the
antecubital veins to the jugular vein. There is no evidence of thrombus.

[Series 1: us extrem up venous*right* · 17 of 38 slices shown]
[im 1/38]
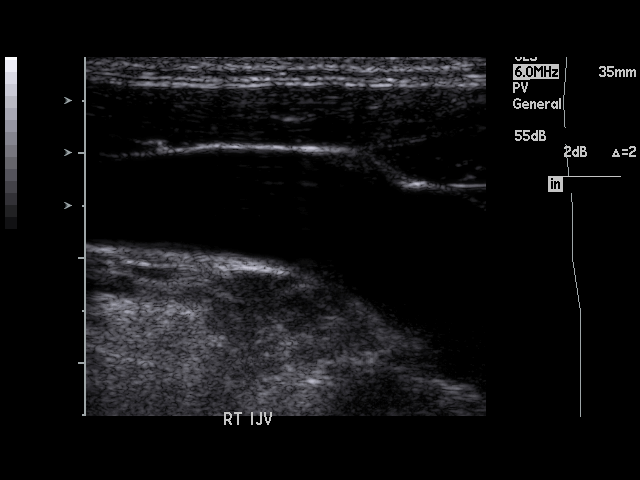
[im 4/38]
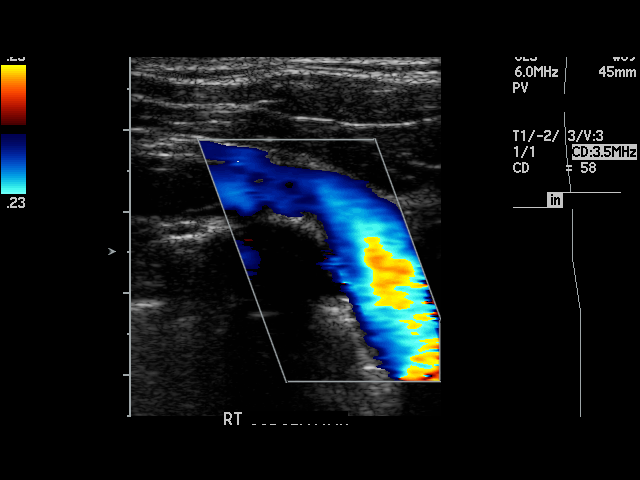
[im 5/38]
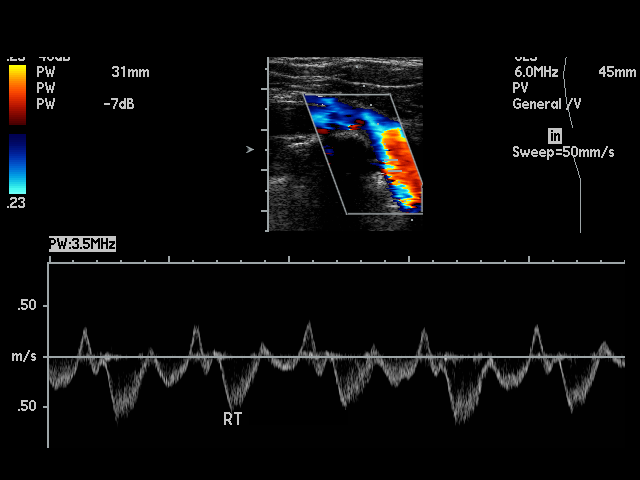
[im 7/38]
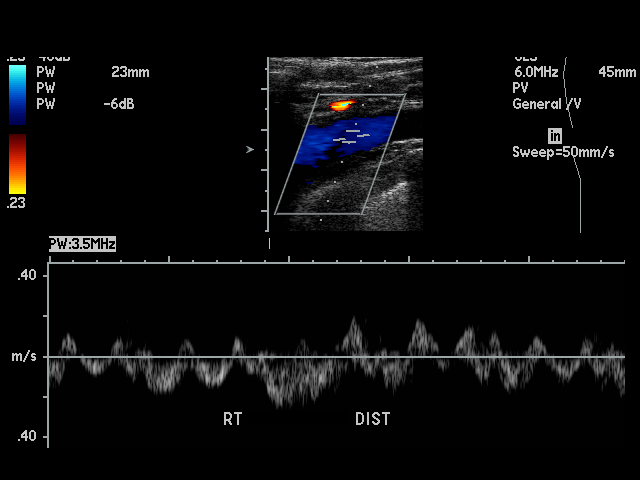
[im 10/38]
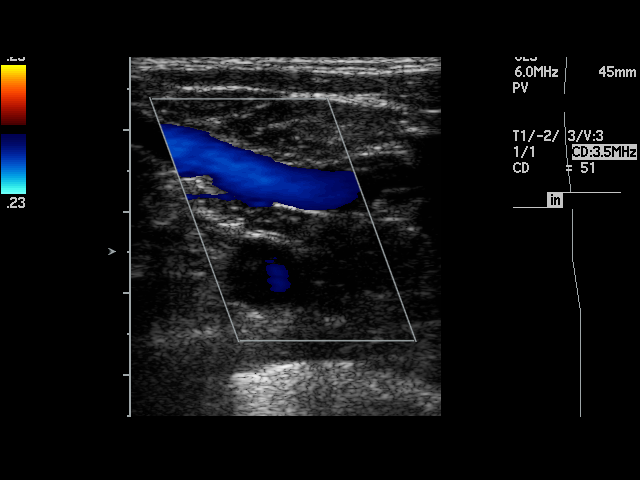
[im 12/38]
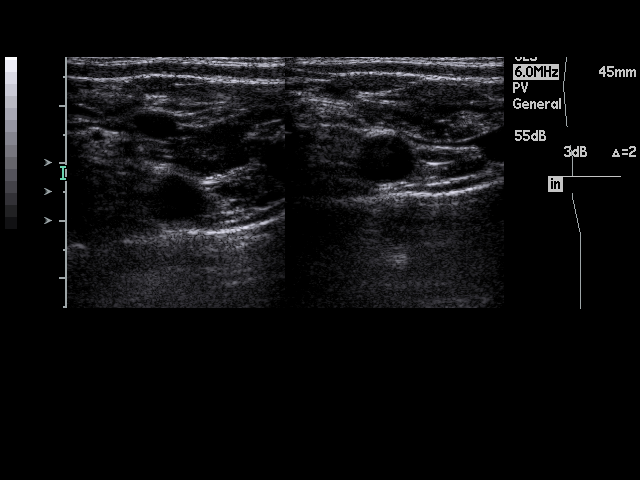
[im 15/38]
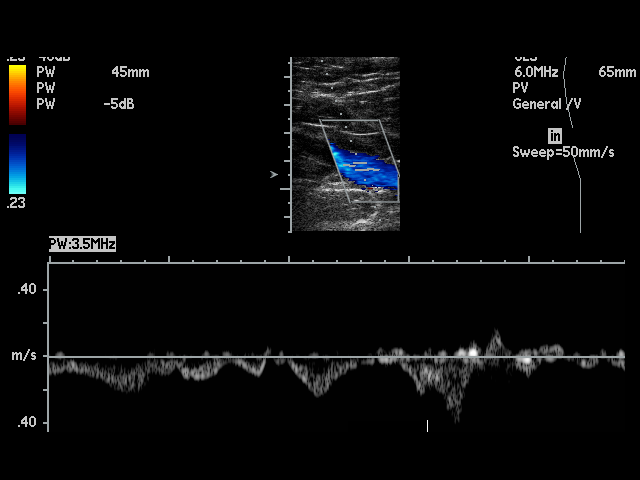
[im 17/38]
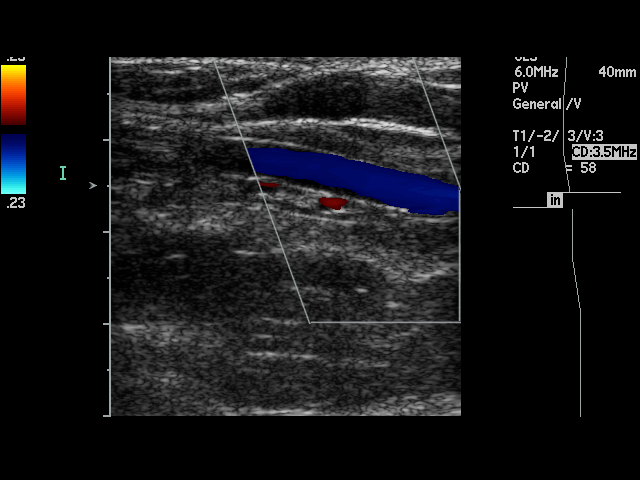
[im 20/38]
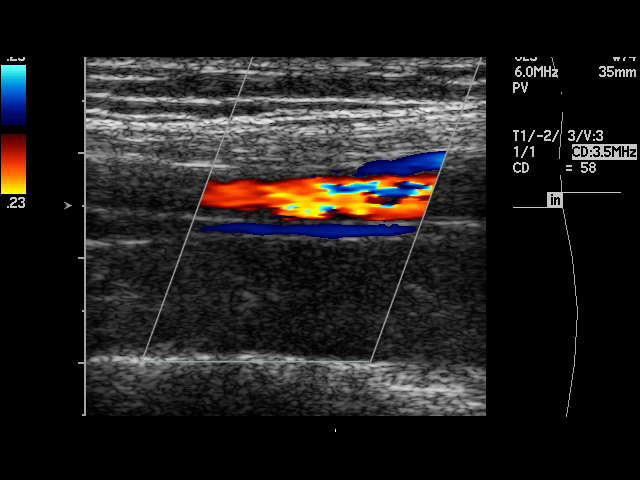
[im 21/38]
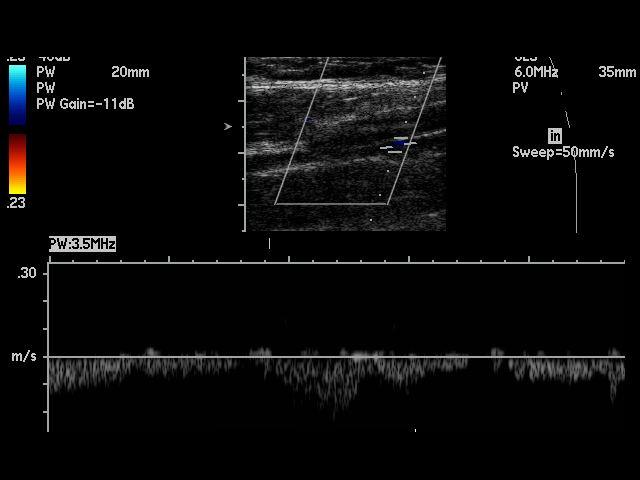
[im 23/38]
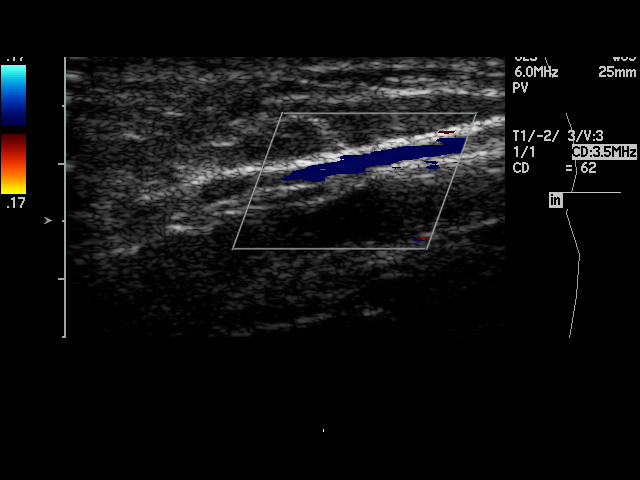
[im 26/38]
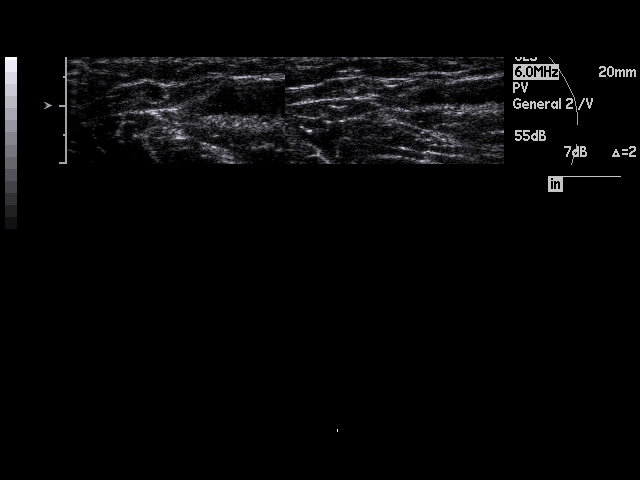
[im 28/38]
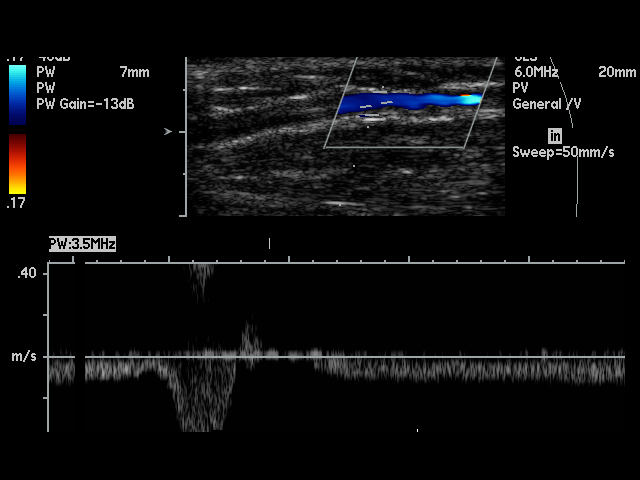
[im 31/38]
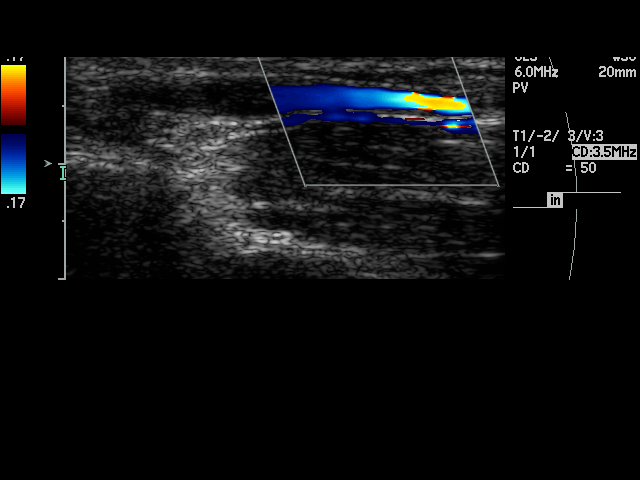
[im 33/38]
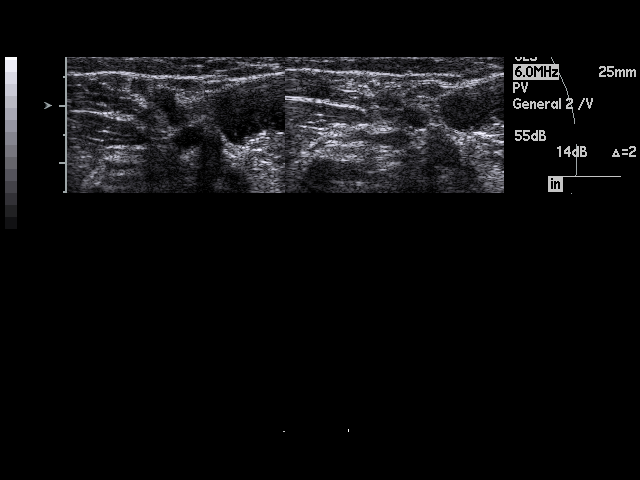
[im 34/38]
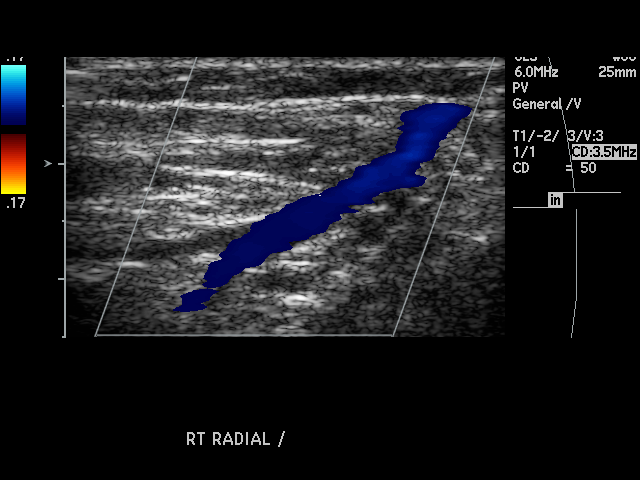
[im 38/38]
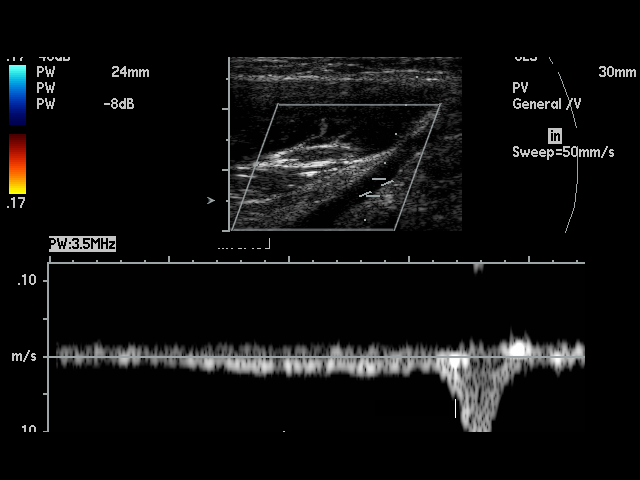

[17 of 24 positions shown; findings below may reference images not displayed]

IMPRESSION: I see no evidence of thrombus within the deep veins of the
right upper extremity.

## 2011-08-24 LAB — LIPID PANEL
Cholesterol: 127 mg/dL (ref 0–200)
HDL Cholesterol: 20 mg/dL — ABNORMAL LOW (ref 40–60)
Ldl Cholesterol, Calc: 47 mg/dL (ref 0–100)
Triglycerides: 298 mg/dL — ABNORMAL HIGH (ref 0–200)
VLDL Cholesterol, Calc: 60 mg/dL — ABNORMAL HIGH (ref 5–40)

## 2011-08-24 LAB — CK TOTAL AND CKMB (NOT AT ARMC)
CK, Total: 193 U/L (ref 35–232)
CK-MB: 1.8 ng/mL (ref 0.5–3.6)

## 2011-08-24 LAB — TROPONIN I: Troponin-I: <0.02 ng/mL

## 2011-08-29 ENCOUNTER — Inpatient Hospital Stay: Payer: Self-pay | Admitting: Internal Medicine

## 2011-08-29 LAB — CBC
HCT: 32.8 % — ABNORMAL LOW (ref 40.0–52.0)
HGB: 9.9 g/dL — ABNORMAL LOW (ref 13.0–18.0)
MCH: 18.1 pg — ABNORMAL LOW (ref 26.0–34.0)
MCHC: 30.1 g/dL — ABNORMAL LOW (ref 32.0–36.0)
MCV: 60 fL — ABNORMAL LOW (ref 80–100)
Platelet: 290 10*3/uL (ref 150–440)
RBC: 5.45 10*6/uL (ref 4.40–5.90)
RDW: 21.1 % — ABNORMAL HIGH (ref 11.5–14.5)
WBC: 9.2 10*3/uL (ref 3.8–10.6)

## 2011-08-29 LAB — COMPREHENSIVE METABOLIC PANEL
Albumin: 3.8 g/dL (ref 3.4–5.0)
Alkaline Phosphatase: 105 U/L (ref 50–136)
Anion Gap: 7 (ref 7–16)
BUN: 10 mg/dL (ref 7–18)
Bilirubin,Total: 0.6 mg/dL (ref 0.2–1.0)
Calcium, Total: 8.6 mg/dL (ref 8.5–10.1)
Chloride: 106 mmol/L (ref 98–107)
Co2: 25 mmol/L (ref 21–32)
Creatinine: 1.09 mg/dL (ref 0.60–1.30)
EGFR (African American): >60
EGFR (Non-African Amer.): >60
Glucose: 93 mg/dL (ref 65–99)
Osmolality: 274 (ref 275–301)
Potassium: 3.7 mmol/L (ref 3.5–5.1)
SGOT(AST): 22 U/L (ref 15–37)
SGPT (ALT): 20 U/L
Sodium: 138 mmol/L (ref 136–145)
Total Protein: 7.7 g/dL (ref 6.4–8.2)

## 2011-08-29 LAB — TROPONIN I: Troponin-I: <0.02 ng/mL

## 2011-08-29 LAB — URINALYSIS, COMPLETE
Bacteria: NONE SEEN
Bilirubin,UR: NEGATIVE
Blood: NEGATIVE
Glucose,UR: NEGATIVE mg/dL (ref 0–75)
Ketone: NEGATIVE
Leukocyte Esterase: NEGATIVE
Nitrite: NEGATIVE
Ph: 6 (ref 4.5–8.0)
Protein: NEGATIVE
RBC,UR: NONE SEEN /HPF (ref 0–5)
Specific Gravity: 1.005 (ref 1.003–1.030)
Squamous Epithelial: NONE SEEN
WBC UR: <1 /HPF (ref 0–5)

## 2011-08-29 LAB — APTT: Activated PTT: 39.8 secs — ABNORMAL HIGH (ref 23.6–35.9)

## 2011-08-29 LAB — FOLATE: Folic Acid: 5.9 ng/mL (ref 3.1–100.0)

## 2011-08-29 LAB — IRON AND TIBC
Iron Bind.Cap.(Total): 511 ug/dL — ABNORMAL HIGH (ref 250–450)
Iron Saturation: 4 %
Iron: 20 ug/dL — ABNORMAL LOW (ref 65–175)
Unbound Iron-Bind.Cap.: 491 ug/dL

## 2011-08-29 LAB — FERRITIN: Ferritin (ARMC): 9 ng/mL (ref 8–388)

## 2011-08-29 LAB — PROTIME-INR
INR: 1.5
INR: 1.6
Prothrombin Time: 18.5 secs — ABNORMAL HIGH (ref 11.5–14.7)
Prothrombin Time: 19.3 secs — ABNORMAL HIGH (ref 11.5–14.7)

## 2011-08-29 LAB — LIPASE, BLOOD: Lipase: 176 U/L (ref 73–393)

## 2011-08-30 LAB — CBC WITH DIFFERENTIAL/PLATELET
Basophil #: 0.1 10*3/uL (ref 0.0–0.1)
Basophil %: 1 %
Eosinophil #: 0.1 10*3/uL (ref 0.0–0.7)
Eosinophil %: 1.3 %
HCT: 30.6 % — ABNORMAL LOW (ref 40.0–52.0)
HGB: 9.3 g/dL — ABNORMAL LOW (ref 13.0–18.0)
Lymphocyte #: 2.3 10*3/uL (ref 1.0–3.6)
Lymphocyte %: 40.2 %
MCH: 18.3 pg — ABNORMAL LOW (ref 26.0–34.0)
MCHC: 30.3 g/dL — ABNORMAL LOW (ref 32.0–36.0)
MCV: 61 fL — ABNORMAL LOW (ref 80–100)
Monocyte #: 0.7 x10 3/mm (ref 0.2–1.0)
Monocyte %: 12.9 %
Neutrophil #: 2.6 10*3/uL (ref 1.4–6.5)
Neutrophil %: 44.6 %
Platelet: 258 10*3/uL (ref 150–440)
RBC: 5.05 10*6/uL (ref 4.40–5.90)
RDW: 21.1 % — ABNORMAL HIGH (ref 11.5–14.5)
WBC: 5.8 10*3/uL (ref 3.8–10.6)

## 2011-08-30 LAB — COMPREHENSIVE METABOLIC PANEL
Albumin: 3.2 g/dL — ABNORMAL LOW (ref 3.4–5.0)
Alkaline Phosphatase: 87 U/L (ref 50–136)
Anion Gap: 7 (ref 7–16)
BUN: 9 mg/dL (ref 7–18)
Bilirubin,Total: 0.7 mg/dL (ref 0.2–1.0)
Calcium, Total: 8.1 mg/dL — ABNORMAL LOW (ref 8.5–10.1)
Chloride: 109 mmol/L — ABNORMAL HIGH (ref 98–107)
Co2: 25 mmol/L (ref 21–32)
Creatinine: 0.98 mg/dL (ref 0.60–1.30)
EGFR (African American): >60
EGFR (Non-African Amer.): >60
Glucose: 75 mg/dL (ref 65–99)
Osmolality: 279 (ref 275–301)
Potassium: 3.8 mmol/L (ref 3.5–5.1)
SGOT(AST): 24 U/L (ref 15–37)
SGPT (ALT): 17 U/L
Sodium: 141 mmol/L (ref 136–145)
Total Protein: 6.5 g/dL (ref 6.4–8.2)

## 2011-10-14 ENCOUNTER — Ambulatory Visit: Payer: Self-pay | Admitting: Internal Medicine

## 2011-10-14 LAB — CANCER CENTER HEMOGLOBIN: HGB: 10 g/dL — ABNORMAL LOW (ref 13.0–18.0)

## 2011-11-04 ENCOUNTER — Ambulatory Visit: Payer: Self-pay | Admitting: Internal Medicine

## 2012-02-22 ENCOUNTER — Emergency Department: Payer: Self-pay | Admitting: Unknown Physician Specialty

## 2012-02-22 LAB — CBC
HCT: 38.8 % — ABNORMAL LOW (ref 40.0–52.0)
HGB: 11.6 g/dL — ABNORMAL LOW (ref 13.0–18.0)
MCH: 19 pg — ABNORMAL LOW (ref 26.0–34.0)
MCHC: 29.9 g/dL — ABNORMAL LOW (ref 32.0–36.0)
MCV: 63 fL — ABNORMAL LOW (ref 80–100)
Platelet: 277 10*3/uL (ref 150–440)
RBC: 6.13 10*6/uL — ABNORMAL HIGH (ref 4.40–5.90)
RDW: 23.4 % — ABNORMAL HIGH (ref 11.5–14.5)
WBC: 8.9 10*3/uL (ref 3.8–10.6)

## 2012-02-22 LAB — BASIC METABOLIC PANEL
Anion Gap: 7 (ref 7–16)
BUN: 10 mg/dL (ref 7–18)
Calcium, Total: 9 mg/dL (ref 8.5–10.1)
Chloride: 104 mmol/L (ref 98–107)
Co2: 27 mmol/L (ref 21–32)
Creatinine: 1.28 mg/dL (ref 0.60–1.30)
EGFR (African American): >60
EGFR (Non-African Amer.): >60
Glucose: 153 mg/dL — ABNORMAL HIGH (ref 65–99)
Osmolality: 278 (ref 275–301)
Potassium: 3.5 mmol/L (ref 3.5–5.1)
Sodium: 138 mmol/L (ref 136–145)

## 2012-02-22 LAB — CK TOTAL AND CKMB (NOT AT ARMC)
CK, Total: 468 U/L — ABNORMAL HIGH (ref 35–232)
CK-MB: 3.2 ng/mL (ref 0.5–3.6)

## 2012-02-22 LAB — TROPONIN I: Troponin-I: <0.02 ng/mL

## 2012-02-23 LAB — PROTIME-INR
INR: 1.8
Prothrombin Time: 21.3 secs — ABNORMAL HIGH (ref 11.5–14.7)

## 2012-06-15 ENCOUNTER — Emergency Department: Payer: Self-pay | Admitting: Emergency Medicine

## 2012-06-15 LAB — CBC WITH DIFFERENTIAL/PLATELET
Basophil #: 0.1 10*3/uL (ref 0.0–0.1)
Basophil %: 1.6 %
Eosinophil #: 0.1 10*3/uL (ref 0.0–0.7)
Eosinophil %: 1.3 %
HCT: 34.7 % — ABNORMAL LOW (ref 40.0–52.0)
HGB: 10.6 g/dL — ABNORMAL LOW (ref 13.0–18.0)
Lymphocyte #: 2.6 10*3/uL (ref 1.0–3.6)
Lymphocyte %: 33.2 %
MCH: 19.9 pg — ABNORMAL LOW (ref 26.0–34.0)
MCHC: 30.7 g/dL — ABNORMAL LOW (ref 32.0–36.0)
MCV: 65 fL — ABNORMAL LOW (ref 80–100)
Monocyte #: 0.8 x10 3/mm (ref 0.2–1.0)
Monocyte %: 9.8 %
Neutrophil #: 4.3 10*3/uL (ref 1.4–6.5)
Neutrophil %: 54.1 %
Platelet: 276 10*3/uL (ref 150–440)
RBC: 5.36 10*6/uL (ref 4.40–5.90)
RDW: 20.9 % — ABNORMAL HIGH (ref 11.5–14.5)
WBC: 7.9 10*3/uL (ref 3.8–10.6)

## 2012-06-15 LAB — COMPREHENSIVE METABOLIC PANEL
Albumin: 3.5 g/dL (ref 3.4–5.0)
Alkaline Phosphatase: 107 U/L (ref 50–136)
Anion Gap: 6 — ABNORMAL LOW (ref 7–16)
BUN: 10 mg/dL (ref 7–18)
Bilirubin,Total: 0.3 mg/dL (ref 0.2–1.0)
Calcium, Total: 8.3 mg/dL — ABNORMAL LOW (ref 8.5–10.1)
Chloride: 107 mmol/L (ref 98–107)
Co2: 25 mmol/L (ref 21–32)
Creatinine: 1.3 mg/dL (ref 0.60–1.30)
EGFR (African American): >60
EGFR (Non-African Amer.): >60
Glucose: 121 mg/dL — ABNORMAL HIGH (ref 65–99)
Osmolality: 276 (ref 275–301)
Potassium: 3.7 mmol/L (ref 3.5–5.1)
SGOT(AST): 34 U/L (ref 15–37)
SGPT (ALT): 28 U/L (ref 12–78)
Sodium: 138 mmol/L (ref 136–145)
Total Protein: 7.2 g/dL (ref 6.4–8.2)

## 2012-06-15 LAB — TROPONIN I: Troponin-I: <0.02 ng/mL

## 2012-06-16 LAB — CK TOTAL AND CKMB (NOT AT ARMC)
CK, Total: 492 U/L — ABNORMAL HIGH (ref 35–232)
CK-MB: 2.6 ng/mL (ref 0.5–3.6)

## 2012-06-16 LAB — PROTIME-INR
INR: 2.9
Prothrombin Time: 29.2 secs — ABNORMAL HIGH (ref 11.5–14.7)

## 2012-06-16 LAB — MAGNESIUM: Magnesium: 1.5 mg/dL — ABNORMAL LOW

## 2012-06-16 LAB — TROPONIN I: Troponin-I: <0.02 ng/mL

## 2012-06-25 ENCOUNTER — Emergency Department: Payer: Self-pay | Admitting: Emergency Medicine

## 2012-06-25 LAB — URINALYSIS, COMPLETE
Bacteria: NONE SEEN
Bilirubin,UR: NEGATIVE
Blood: NEGATIVE
Glucose,UR: NEGATIVE mg/dL (ref 0–75)
Ketone: NEGATIVE
Leukocyte Esterase: NEGATIVE
Nitrite: NEGATIVE
Ph: 7 (ref 4.5–8.0)
Protein: NEGATIVE
RBC,UR: NONE SEEN /HPF (ref 0–5)
Specific Gravity: 1.002 (ref 1.003–1.030)
Squamous Epithelial: NONE SEEN
WBC UR: NONE SEEN /HPF (ref 0–5)

## 2012-06-25 LAB — CBC
HCT: 37.3 % — ABNORMAL LOW (ref 40.0–52.0)
HGB: 11.5 g/dL — ABNORMAL LOW (ref 13.0–18.0)
MCH: 20.2 pg — ABNORMAL LOW (ref 26.0–34.0)
MCHC: 30.9 g/dL — ABNORMAL LOW (ref 32.0–36.0)
MCV: 65 fL — ABNORMAL LOW (ref 80–100)
Platelet: 285 10*3/uL (ref 150–440)
RBC: 5.73 10*6/uL (ref 4.40–5.90)
RDW: 20.7 % — ABNORMAL HIGH (ref 11.5–14.5)
WBC: 7.2 10*3/uL (ref 3.8–10.6)

## 2012-06-25 LAB — BASIC METABOLIC PANEL
Anion Gap: 2 — ABNORMAL LOW (ref 7–16)
BUN: 10 mg/dL (ref 7–18)
Calcium, Total: 8.5 mg/dL (ref 8.5–10.1)
Chloride: 108 mmol/L — ABNORMAL HIGH (ref 98–107)
Co2: 28 mmol/L (ref 21–32)
Creatinine: 1 mg/dL (ref 0.60–1.30)
EGFR (African American): >60
EGFR (Non-African Amer.): >60
Glucose: 82 mg/dL (ref 65–99)
Osmolality: 274 (ref 275–301)
Potassium: 3.7 mmol/L (ref 3.5–5.1)
Sodium: 138 mmol/L (ref 136–145)

## 2012-06-25 LAB — PROTIME-INR
INR: 3.5
Prothrombin Time: 33.5 secs — ABNORMAL HIGH (ref 11.5–14.7)

## 2012-06-25 LAB — HEPATIC FUNCTION PANEL A (ARMC)
Albumin: 3.5 g/dL (ref 3.4–5.0)
Alkaline Phosphatase: 114 U/L (ref 50–136)
Bilirubin, Direct: <0.1 mg/dL (ref 0.00–0.20)
Bilirubin,Total: 0.3 mg/dL (ref 0.2–1.0)
SGOT(AST): 29 U/L (ref 15–37)
SGPT (ALT): 25 U/L (ref 12–78)
Total Protein: 7.1 g/dL (ref 6.4–8.2)

## 2012-06-25 LAB — APTT: Activated PTT: 63.9 secs — ABNORMAL HIGH (ref 23.6–35.9)

## 2012-06-25 LAB — TROPONIN I: Troponin-I: <0.02 ng/mL

## 2012-06-25 LAB — LIPASE, BLOOD: Lipase: 194 U/L (ref 73–393)

## 2012-09-09 ENCOUNTER — Observation Stay: Payer: Self-pay | Admitting: Internal Medicine

## 2012-09-09 LAB — CBC
HCT: 38.1 % — ABNORMAL LOW (ref 40.0–52.0)
HGB: 12.3 g/dL — ABNORMAL LOW (ref 13.0–18.0)
MCH: 21.8 pg — ABNORMAL LOW (ref 26.0–34.0)
MCHC: 32.4 g/dL (ref 32.0–36.0)
MCV: 67 fL — ABNORMAL LOW (ref 80–100)
Platelet: 255 10*3/uL (ref 150–440)
RBC: 5.66 10*6/uL (ref 4.40–5.90)
RDW: 21.9 % — ABNORMAL HIGH (ref 11.5–14.5)
WBC: 8.5 10*3/uL (ref 3.8–10.6)

## 2012-09-09 LAB — COMPREHENSIVE METABOLIC PANEL
Albumin: 3.4 g/dL (ref 3.4–5.0)
Alkaline Phosphatase: 120 U/L (ref 50–136)
Anion Gap: 6 — ABNORMAL LOW (ref 7–16)
BUN: 9 mg/dL (ref 7–18)
Bilirubin,Total: 0.3 mg/dL (ref 0.2–1.0)
Calcium, Total: 8.5 mg/dL (ref 8.5–10.1)
Chloride: 105 mmol/L (ref 98–107)
Co2: 27 mmol/L (ref 21–32)
Creatinine: 1.25 mg/dL (ref 0.60–1.30)
EGFR (African American): >60
EGFR (Non-African Amer.): >60
Glucose: 141 mg/dL — ABNORMAL HIGH (ref 65–99)
Osmolality: 277 (ref 275–301)
Potassium: 3.5 mmol/L (ref 3.5–5.1)
SGOT(AST): 27 U/L (ref 15–37)
SGPT (ALT): 28 U/L (ref 12–78)
Sodium: 138 mmol/L (ref 136–145)
Total Protein: 7.4 g/dL (ref 6.4–8.2)

## 2012-09-09 LAB — CK-MB
CK-MB: 2 ng/mL (ref 0.5–3.6)
CK-MB: 2.7 ng/mL (ref 0.5–3.6)

## 2012-09-09 LAB — TROPONIN I
Troponin-I: <0.02 ng/mL
Troponin-I: 0.02 ng/mL
Troponin-I: <0.02 ng/mL

## 2012-09-09 LAB — PROTIME-INR
INR: 2.6
Prothrombin Time: 26.8 secs — ABNORMAL HIGH (ref 11.5–14.7)

## 2012-09-09 LAB — CK TOTAL AND CKMB (NOT AT ARMC)
CK, Total: 338 U/L — ABNORMAL HIGH (ref 35–232)
CK-MB: 1.7 ng/mL (ref 0.5–3.6)

## 2012-09-10 LAB — LIPID PANEL
Cholesterol: 140 mg/dL (ref 0–200)
HDL Cholesterol: 21 mg/dL — ABNORMAL LOW (ref 40–60)
Ldl Cholesterol, Calc: 63 mg/dL (ref 0–100)
Triglycerides: 281 mg/dL — ABNORMAL HIGH (ref 0–200)
VLDL Cholesterol, Calc: 56 mg/dL — ABNORMAL HIGH (ref 5–40)

## 2012-09-10 LAB — PROTIME-INR
INR: 3.1
Prothrombin Time: 30.6 secs — ABNORMAL HIGH (ref 11.5–14.7)

## 2012-09-10 LAB — TROPONIN I: Troponin-I: <0.02 ng/mL

## 2012-09-10 LAB — TSH: Thyroid Stimulating Horm: 1.04 u[IU]/mL

## 2013-02-14 DIAGNOSIS — D8685 Sarcoid myocarditis: Secondary | ICD-10-CM | POA: Insufficient documentation

## 2013-02-14 DIAGNOSIS — I442 Atrioventricular block, complete: Secondary | ICD-10-CM | POA: Insufficient documentation

## 2013-03-06 DIAGNOSIS — Z7901 Long term (current) use of anticoagulants: Secondary | ICD-10-CM | POA: Insufficient documentation

## 2014-05-23 NOTE — H&P (Signed)
PATIENT NAME:  [name], [name] MR#:  [id number] DATE OF BIRTH:  07-25-1955  DATE OF ADMISSION:  08/23/2011  PRIMARY CARE PHYSICIAN: Corky Downs, MD  CHIEF COMPLAINT: Chest pain.   HISTORY OF PRESENT ILLNESS: This is a 59 year old male with a history of subclavian stenosis, cerebrovascular accident, coronary artery disease with a pacemaker, AICD, atrial fibrillation, and pulmonary emboli who presents with chest pain. The patient says that he has chronic chest pain, however, over the past few days he has experienced irregular heart rhythms associated with some nausea and stabbing chest pain lasting about 5 minutes to 1 hour. He is unable to take nitroglycerin due to an allergy. It is exacerbated by movement and relieved just on its own. In a worse case scenario it is a 9 out of 10. However, he has no pain currently.   REVIEW OF SYSTEMS: CONSTITUTIONAL: No fever, fatigue, or weakness. EYES: No blurred or double vision, glaucoma, or cataracts. ENT: No ear pain, hearing loss, or seasonal allergies. RESPIRATORY: No cough, wheezing, hemoptysis, or chronic obstructive pulmonary disease. He has a history of sarcoid. CARDIOVASCULAR: Chest pain/pressure as mentioned above. No palpitations, orthopnea, edema, arrhythmia, or dyspnea on exertion. GASTROINTESTINAL: No nausea, vomiting, diarrhea, abdominal pain, melena, or ulcers. GU: No dysuria or hematuria. ENDOCRINE: No nocturia or thyroid problems. HEME/LYMPH: No anemia or easy bruising. SKIN: No rash or lesions. MUSCULOSKELETAL: No limited activity.  NEUROLOGIC: Positive history of cerebrovascular accident. PSYCH: No history of bipolar affective disorder.   PAST MEDICAL HISTORY:  1. Cerebrovascular accident. 2. Coronary artery disease.  3. Pulmonary embolus. 4. Atrial fibrillation. 5. Gastroesophageal reflux disease.  6. Hypertension.  7. The patient had a cardiac catheterization in February 2012 by Dr. Gwen Pounds which showed moderate LV dysfunction with mild  to moderate three-vessel disease. He had proximal LAD 50% stenosis, first diagonal 50% stenosis, proximal circumflex 25% stenosis, proximal RCA 25% stenosis, mid RCA 30% stenosis, and distal RCA 10% stenosis. 8. History of left subclavian stenosis.  9. 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  PAST SURGICAL HISTORY:  1. Hernia repair.  2. TURP.  3. AICD pacemaker. 4. Anal fistula repair.   SOCIAL HISTORY: The patient smokes one cigar a day. No alcohol or IV drug use.   ALLERGIES: Celebrex, Cipro, Dilaudid, nitroglycerin, tramadol, Zetia, and Pradaxa.   MEDICATIONS:  1. Ambien 10 mg at bedtime.  2. Oxybutynin 10 mg daily.  3. Nicorette 4 mg as needed.  4. Nexium 40 mg twice a day. 5. Metoprolol 100 mg daily.  6. Flexeril 10 mg twice a day p.r.n.  7. Finasteride 5 mg daily.  8. Coumadin 7.5 mg daily.  9. Albuterol 2 puffs four times daily as needed.  10. Acetaminophen/hydrocodone 2 tablets every four hours p.r.n. pain.  FAMILY HISTORY: Positive for diabetes and heart disease.   PHYSICAL EXAMINATION:   VITAL SIGNS: Temperature 95.2, pulse 56, respirations 16, blood pressure 182/87, and saturation 100% on room air.   GENERAL: The patient is alert and oriented, not in acute distress.   HEENT: Head is atraumatic. Pupils are round and reactive. Sclerae anicteric. Mucous membranes are moist. Oropharynx is clear.   NECK: Supple without jugular venous distention, carotid bruit or enlarged thyroid.   CARDIOVASCULAR: Regular rate and rhythm. No appreciable murmurs, gallops, or rubs.  PMI is not displaced.  LUNGS: Clear to auscultation without crackles, rales, rhonchi, or wheezing. Normal to percussion.   ABDOMEN: Bowel sounds are positive. Nontender and nondistended. No hepatosplenomegaly.   BACK: No  CVA or vertebral tenderness.   EXTREMITIES: No clubbing, cyanosis, or edema.   NEURO: Cranial nerves II through XII grossly intact. No focal  deficits.  SKIN: No rash or lesions.   LABORATORY AND DIAGNOSTIC DATA: White blood cells 6, hemoglobin 10.4, hematocrit 37, and platelets 250. Sodium 142, potassium 3.8, chloride 109, bicarbonate 25, BUN 10, creatinine 0.98, and glucose 73. Troponin less than 0.02. CK 245. INR is 2.5.   EKG: Paced rhythm. No ST elevations.   ASSESSMENT AND PLAN: A [age] year old male with a history of coronary artery disease with cardiac catheterization in February 2012 showing some mild coronary artery disease who presents with atypical chest pain.  1. Chest pain. The patient will be admitted to the hospitalist service for observation. We will continue to cycle cardiac enzymes and admit to telemetry. We will go ahead and consult cardiology. It looks like he has seen [facility] in the past. Dr. Corky DownsJaved Masoud is his primary care physician. However, it appears that Dr. Corky DownsJaved Masoud is not available over the weekend. We will continue aspirin and metoprolol. I am not quite sure why he is not on Plavix, but we will check a lipid profile and this can be recommended prior to discharge if necessary.  2. Hypertension, accelerated. The patient's blood pressure is elevated. We will restart his metoprolol and monitor closely.  3. History of pulmonary embolus. INR is therapeutic at 2.5. We will continue Coumadin.  4. Benign prostatic hypertrophy. Continue Proscar.  5. Gastroesophageal reflux disease. Continue Nexium.  6. Tobacco dependence/cigar dependence. The patient was counseled for three minutes. We will continue nicotine oral inhaler. The patient is interested in completely quitting his cigars.   CODE STATUS: The patient is FULL CODE status.  TIME SPENT: Approximately 45 minutes.   ____________________________ Janyth ContesSital P. Juliene PinaMody, MD spm:slb D: 08/23/2011 18:19:40 ET T: 08/24/2011 08:51:10 ET JOB#: [id number]  cc: Farzana Koci P. Juliene PinaMody, MD, <Dictator> Corky DownsJaved Masoud, MD Janyth ContesSITAL P Keyonna Comunale MD ELECTRONICALLY SIGNED 08/24/2011  12:11

## 2014-05-23 NOTE — Consult Note (Signed)
Pt seen and examined. Full consult to follow. Known hx of PE/blood clots even on coumadin in the past. Now with increasing low abd pain and rectal bleeding. Initially, BRBPR but now more melena. [facility]. Claimed to have [facility] for GI bleeding. No obvious cause found. Need records from [facility] to review. Pt worried about source of bleeding but also worried about developing more clots with coumadin being held. Requests pain meds. [facility]. Hospitalist to contact [facility] about possible transfer.  Electronic Signatures: Lutricia Feilh, Zollie Clemence (MD)  (Signed on 27-Jul-13 10:42)  Authored  Last Updated: 27-Jul-13 10:42 by Lutricia Feilh, Anav Lammert (MD)

## 2014-05-23 NOTE — Consult Note (Signed)
PATIENT NAME:  [name], [name] MR#:  [id number] DATE OF BIRTH:  [birth year]  DATE OF CONSULTATION:  08/30/2011  REFERRING PHYSICIAN:  Corky DownsJaved Masoud, MD and [facility] GASTROENTEROLOGIST: Lutricia FeilPaul Oh, MD for gastrointestinal bleeding CONSULTING PHYSICIAN:  Olivea Sonnen D. Burris Matherne, MD  INDICATION: Atrial fibrillation, history of pulmonary embolus, GI bleed.   HISTORY OF PRESENT ILLNESS: [name] is a 59 year old African American male with a history of pulmonary embolus, chronic atrial fibrillation, on Coumadin, presented with some chest pain. He has some narcotic-seeking tendencies, prior GI bleed in the past, status post multiple EGDs, colonoscopies, capsule endoscopy with no clear etiology found, presents to the Emergency Room today with complaints of dark stools and blood.  The patient was found to have melanic stools in the Emergency Room, heme positive, and was admitted. The patient has chronic chest pain, admitted recently with prior.  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 Juel BurrowMasoud, who is his primary care physician as well. He was discharged home for follow up. The patient continues to have severe pain symptoms, vague chest pain  and in his leg and his abdomen but significant gastrointestinal bleed. He has had some trouble with narcotic use chronically. He has had trouble with leaving Against Medical Advice once he was not given sufficient narcotics to his liking. He claims allergies to nitroglycerin. He has a history of automatic implantable cardiac defibrillator placement. His hemoglobin was 9.9. He usually has a hemoglobin about 10.0.  He is still on Coumadin, had [facility],  and here at [place]. He has had multiple visits in multiple hospitals. He is not happy with his [facility] at [facility], which [facility], and at times he comes to [place].  [facility] now where his cardiologist and defibrillator  doctor is.  He is, again, being treated with Coumadin for deep venous thrombosis and pulmonary embolus.   PAST MEDICAL HISTORY:  1. Cerebrovascular accident. 2. Coronary artery disease. 3. Pulmonary embolus.  4. Atrial fibrillation. 5. Gastroesophageal reflux disease. 6. Hypertension. 7. Last catheterization in 2012 by Dr. Gwen PoundsKowalski which showed left ventricular dysfunction, mild-to-moderate three-vessel disease  PAST SURGICAL HISTORY:  1. Hernia repair.  2. Transurethral resection of prostate.  3. Automatic implantable cardiac defibrillator.  4. Anal fistula repair.  5. Cardiac catheterization.  6. EGD, colonoscopy capsule, capsule endoscopy.  7. Multiple surgeries.   SOCIAL HISTORY: Cigars. He is a Museum/gallery conservatorsinger professionally. There is possible prescription substance abuse.   ALLERGIES: Celebrex, Ciprofloxacin,  Dilaudid, nitroglycerin, tramadol, Zetia, Pradaxa.   MEDICATIONS:  1. Acetaminophen/oxycodone 325/5, 2 tablets every 4 hours p.r.n.  2. Finasteride 5 mg q.i.d.  3. Flexeril 10 mg, 2 tablets at bedtime.  4. Metoprolol ER 50 mcg a day.   5. Multivitamin once a day.  6. Nexium 40 mg b.i.d.  7. Nicorette as necessary.  8. Oxybutynin 10 mg, 1 tablet once a day.  9. Coumadin 7.5 once a day.   REVIEW OF SYSTEMS: No blackout spells or syncope. No nausea or vomiting. No fever. No chills or sweats. No weight loss. No weight gain. No hemoptysis, hematemesis. Denied bright red blood per rectum. No vision change or hearing change. Denies sputum production or cough. He has had chest pain. He has had some bright red blood, some dark stools, melena. He has not coughed up any blood.   FAMILY HISTORY: Positive for diabetes and heart disease   PHYSICAL EXAMINATION:  VITAL SIGNS: Blood pressure 145/77, pulse 80, respiratory rate 16, afebrile.   HEENT: Normocephalic, atraumatic. Pupils are equal and reactive to light.   NECK: Supple. No significant jugular venous distention, bruits, or  adenopathy.  LUNGS: Clear to auscultation and percussion. No significant wheeze, rhonchi, or rale.   HEART: Regular rate and rhythm. Systolic ejection murmur at left sternal border. PMI is slightly displaced laterally.   ABDOMEN: Exam was benign. Positive bowel sounds. No rebound, guarding or tenderness.   EXTREMITIES: Exam within normal limits.   NEUROLOGIC: Grossly intact.   SKIN: Exam was normal.   LABORATORY, DIAGNOSTIC AND RADIOLOGICAL DATA: BUN 10, creatinine 1.09, sodium 138, potassium 3.7. Troponin 0.02. LFTs negative. White count 9.2, hemoglobin 9.9, platelet count 290. INR 1.5. EKG today: Sinus rhythm, nonspecific ST-T wave changes. CT of the abdomen done on July 17th shows no acute changes.   ASSESSMENT:  1. Lower GI bleed.  2. History of atrial fibrillation.  3. History of deep vein thrombosis, pulmonary embolus.  4. Acute blood loss. 5. History of hypertension. 6. Tobacco abuse. 7. Chronic pain syndrome.   PLAN: I agree with admit. I agree with following hemoglobin and hematocrit. I agree with following PT- INR, would strongly consider probably an umbrella filter. Follow-up hemoglobin and hematocrit. I agree with GI evaluation, possible need for repeat scope. Follow-up transfusion as necessary. Will probably resume TED stockings, Coumadin at some point if we can locate the GI bleeding. We will refrain from narcotic use for his chronic pain syndrome. I advised the patient to quit smoking.  I will base further evaluation on results.   ____________________________ Bobbie Stack Juliann Pares, MD ddc:cbb D: 08/31/2011 12:50:18 ET T: 08/31/2011 13:44:36 ET JOB#: [id number] Elizer Bostic D Sindhu Nguyen MD ELECTRONICALLY SIGNED 09/03/2011 23:11

## 2014-05-23 NOTE — Consult Note (Signed)
PATIENT NAME:  [name], [name] MR#:  [id number] DATE OF BIRTH:  [birth year]  DATE OF CONSULTATION:  08/24/2011  REFERRING PHYSICIAN:   CONSULTING PHYSICIAN:  Corky DownsJaved Shawndrea Rutkowski, MD  HISTORY OF PRESENT ILLNESS: [name] was admitted into the hospital with chest pain. He said that his heart started to hurt yesterday and today.  He also has been pacing a whole lot. The patient has atrial fibrillation and had ablation done in February of this year. He was on Tikosyn which was stopped about a year ago. He complained of chest pain going to the left shoulder. The patient also has a defibrillator and underlying cardiomyopathy. He has subclavian stenosis, cerebrovascular accident, and coronary artery disease. The patient has had multiple admissions in this hospital. He is also being [facility].   PAST MEDICAL HISTORY:  1. History of cerebrovascular accident. 2. Coronary artery disease. 3. Pulmonary embolus. 4. Atrial fibrillation.  5. Gastroesophageal reflux disease. 6. Hypertension. 7. Cardiac catheterization done about a year ago by Dr. Gwen Harrison which showed moderate LV dysfunction suggestive of underlying ischemic cardiomyopathy. Left subclavian stenosis. 8. History of recurrent left upper extremity deep venous thrombosis.   PAST SURGICAL HISTORY:  1. Automatic implantable cardiac defibrillator pacemaker. 2. Transurethral resection of the prostate.  CURRENT MEDICATIONS: 1. Oxybutynin 10 mg p.o. daily.  2. Ambien 10 mg p.o. at night. 3. Nicorette as needed. 4. Nexium 40 mg 1 tablet p.o. twice a day. 5. Metoprolol 100 mg p.o. daily.  6. Flexeril 10 mg p.o. daily.  7. Finasteride 5 mg p.o. daily.  8. Coumadin 7.5 mg p.o. daily. Pro time is therapeutic.  9. Albuterol 2 puffs four times daily p.r.n.   PHYSICAL EXAMINATION:   GENERAL: The patient is alert and cooperative, in no acute distress, playing his computer at the time of examination.  VITALS: Blood pressure  150/80.   HEENT: Head normocephalic. Pupils reactive. Sclerae anicteric. Tongue is moist, papillated.   NECK: Supple. Jugular venous pressure is not elevated. Carotid upstroke is 2+ without any bruits.   CARDIOVASCULAR: Automatic implantable cardiac defibrillator is placed in the left upper chest. Apical impulse is not palpable. Both heart sounds are distant.   ABDOMEN: Soft and nontender without any hepatosplenomegaly.   ACCESSORY CLINICAL DATA: BNP 142. Calcium 8.4, triglycerides 298, and HDL 20. Total CPK 245 with negative CK/ MB. Troponin is negative. Hematocrit 36.3. Pro time 27.5.   IMPRESSION AND RECOMMENDATIONS: The patient is known to have angina, coronary artery disease, atrial fibrillation, and ischemic cardiomyopathy. He presented yesterday with atypical chest pain. He says that when his pacemaker starts working he starts hurting in the chest and his chest symptoms do not seem to be anginal in nature. He does not have any evidence of congestive heart failure. The patient is symptom free at the present time, troponin is negative, and CK-MB is negative. The patient has other medical problems which include deep venous thrombosis and has been on long-term anticoagulation. His pro time is therapeutic. He is also known to have hypertension with hypertensive cardiovascular disease with history of fibromyalgia. He also has cervical spine disease with radiculopathy, left subclavian stenosis, anemia, and chronic obstructive pulmonary disease. Also has a history of drug-seeking behavior and has a history of CVA in 2009. He continues to smoke and is being [facility]. At the present time, I recommend that he should go home and get another      followup [facility]. I  do not feel that he is a candidate for any invasive work-up at the present time. He will probably need a Myoview stress test which can be done on an outpatient basis.   ____________________________ Corky Downs, MD jm:slb D: 08/24/2011 14:05:51 ET T: 08/24/2011 15:26:49 ET JOB#: [id number]  cc: Corky Downs, MD, <Dictator> Corky Downs MD ELECTRONICALLY SIGNED 09/25/2011 19:21

## 2014-05-23 NOTE — H&P (Signed)
PATIENT NAME:  [name], [name] MR#:  [id number] DATE OF BIRTH:  24-Mar-1955  DATE OF ADMISSION:  08/29/2011  PRIMARY CARE PHYSICIAN: Corky Downs, MD  CHIEF COMPLAINT: Dark stools with blood.   HISTORY OF PRESENT ILLNESS: A 59 year old African American male patient with history of pulmonary embolism and atrial fibrillation on Coumadin with chronic chest pain, narcotic seeking, and prior history of GI bleed, status post multiple EGD, colonoscopy, capsule endoscopy with no clear etiology found, presents to the emergency room today complaining of dark stools and blood. The patient was found to have melenic stools in the emergency room with heme positive, and the hospitalist team has been asked for further admission and management. The patient has had chronic chest pain with which he was admitted recently a week prior, was determined to be musculoskeletal/fibromyalgia after being seen by cardiologist, Dr. Juel Burrow, who is also his primary care physician and was discharged home. The patient continues to complain of pain in his chest and also vague pain in his legs, arms, and also abdominal pain. He does always ask for IV narcotic medications, does not want to take pills. In the past the patient has left the hospital when he was denied IV pain medications. The patient is allergic to nitroglycerin and is unable to take it for his chest pain, although the reaction of the allergy is unknown.   The patient today has hemoglobin of 9.9 with his baseline being around 10. Vitals are in the stable range. He does have microcytic anemia suggesting possibly chronic loss of blood in his stools as he is on Coumadin. His INR was therapeutic 1 week prior with no change of dose.  Today it is 1.5. Troponin in the normal range. EKG shows normal sinus rhythm with no acute ST-T wave changes similar to his prior EKG.   The patient has recently been at [facility], [facility], [facility], and here at [facility]. On further probing him about the reason for visiting multiple hospitals he mentions that he has not been happy with the care he was getting at [facility] which made him go to [facility], and the fact that he lives in [place] and has no choice but to come here, being brought here by EMS when he calls them, and also patient has visited [facility] in [place] as he has recently been dating somebody in that town.   PAST MEDICAL HISTORY: CVA, coronary artery disease, pulmonary embolism, atrial fibrillation, GERD, hypertension. Coronary artery disease with his last cardiac catheterization in February 2012 by Dr. Gwen Pounds which showed moderate LV dysfunction and mild to moderate 3-vessel disease with LAD of 50%, first diagonal 50%, proximal circumflex 25% stenosis, proximal RCA 25% stenosis, and mid RCA 30% stenosis, and the distal RCA of 10% stenosis.  Left subclavian stenosis. 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   PAST SURGICAL HISTORY: Hernia repair, TURP, AICD, anal fistula repair.  Cardiac catheterization. EGD, colonoscopy, capsule endoscopy in multiple hospitals.   SOCIAL HISTORY: The patient smokes 1 cigar a day. No alcohol, no illicit drug use. He is a Sport and exercise psychologist by profession.   ALLERGIES: Celebrex, ciprofloxacin, Dilaudid, nitroglycerin, tramadol, Zetia, and Pradaxa.    HOME MEDICATIONS: 1. Acetaminophen/oxycodone 325/5 mg 2 tablets oral every 4 hours as needed for pain.  2. Finasteride 5 mg orally once a day.  3. Flexeril 10 mg 2 tablets orally once a day at bedtime for muscle spasms.  4. Metoprolol succinate ER  50 mg 1 tablet orally once a day.  5. Multivitamin 1 tablet oral once a day.  6. Nexium 40 mg oral 2 times a day.  7. Nicorette 4 mg transmucosal gum 1 to 2 hours as needed.  8. Oxybutynin 10 mg 1 tablet orally once a day.  9. Coumadin 7.5 mg oral once a day.   REVIEW OF SYSTEMS: Constitutional, eyes, ENT, respiratory,  cardiovascular, gastrointestinal, genitourinary, endocrine, hematology, integumentary, musculoskeletal, neurology, psychiatry reviewed, negative except the ones mentioned in History of Present Illness.   FAMILY HISTORY: Positive for diabetes and heart disease.   PHYSICAL EXAMINATION:  VITAL SIGNS:  Temperature 97, pulse of 78, respirations 20, blood pressure 145/77, saturating 97% on room air.   GENERAL: Obese African American male patient lying comfortably in bed in no acute distress, conversational, cooperative to exam.   PSYCHIATRIC: Alert and oriented x3. Mood and affect appropriate. Judgment intact. Poor insight.   HEENT: Atraumatic, normocephalic. Oral mucosa moist and pink. External ears and nose normal. Pallor positive. No icterus. Pupils bilaterally equal and reactive to light.   NECK: Supple. No thyromegaly. No palpable lymph nodes. Trachea midline. No carotid bruit or JVD.   CARDIOVASCULAR: S1 and S2 with no murmurs. Peripheral pulses 2+ and no edema.   RESPIRATORY: Clear to auscultation on both sides. Normal work of breathing.   GASTROINTESTINAL: Soft abdomen with diffuse tenderness on palpation. No rigidity or guarding. No rebound tenderness. Bowel sounds present. No hernia.   GENITOURINARY: Genital exam normal. Tenderness in the right groin area. No lymph nodes or hernias palpable. No open ulcers.   SKIN: Warm and dry. No petechiae, rash, ulcers.   MUSCULOSKELETAL: No joint swelling, redness, or effusion of the large joints. Normal muscle tone.   NEUROLOGICAL: Motor strength 5/5 in upper extremities. Sensation to fine touch intact all over.   LYMPHATIC: No cervical lymph nodes or inguinal lymph nodes.   LABORATORY STUDIES: BUN 10, creatinine 1.09, sodium 138, potassium 3.7. Troponin less than 0.02.  AST, ALT, alkaline phosphatase, bilirubin normal. WBC 9.2, hemoglobin 9.9, platelets of 290,000, MCV 60.  INR 1.5. Urinalysis normal.   EKG shows normal sinus rhythm with no  acute ST-T wave changes.   CT scan of the abdomen and pelvis was done recently on 08/20/2011 which showed no acute findings.   ASSESSMENT AND PLAN:  1. Gastrointestinal bleed in a patient who is on Coumadin for atrial fibrillation and deep vein thrombosis. The patient has melenic stools with heme-positive test. Hemoglobin stable at 9.9 with his baseline being around 10. I do not suspect any acute bleed in this patient but he is at high risk for decompensation with being on Coumadin and INR elevated for deterioration and the bleeding. We will admit the patient, consult Gastroenterology. We will also consult Dr. Juel Burrow regarding recommendations on anticoagulation. I have discussed with the patient regarding having an IVC filter placed but he would like to discuss this further with Dr. Juel Burrow, his primary care physician and also cardiologist. Presently INR is 1.5. No vitamin K or FFP needed in the patient.  2. Acute blood loss anemia over chronic microcytic anemia. This could be secondary to iron deficiency from chronic blood loss through gastrointestinal. We will check iron studies. No transfusion needed at this time. Hemodynamically stable and asymptomatic, hemoglobin 9.9.  3. Paroxysmal atrial fibrillation. Continue the metoprolol. We will have to hold the Coumadin considering the patient is having the gastrointestinal bleed at this time.  4. History of pulmonary embolisms. Hold Coumadin.  Put patient on TEDs.    5. Hypertension, well controlled. Continue medications.  6. Chronic pain syndrome including chronic chest pain. The patient was recently in the hospital for chronic abdominal and chest pain, was seen by Dr. Juel BurrowMasoud of Cardiology who suggested musculoskeletal versus fibromyalgia. I had a long discussion with the patient regarding his pain medications. I did discuss with him about not using any IV pain medications, which the patient did not seem happy about. We will to use oral medications. The  patient definitely seems to have narcotic-seeking behavior. We will avoid any IV pain medications.  7. Tobacco abuse. The patient has been counseled for at least 4 minutes regarding quitting smoking. He is on the Nicorette gum presently and is trying to quit.  8. Deep vein thrombosis prophylaxis with TEDs.   CODE STATUS FULL CODE.   TIME SPENT TODAY ON THIS CASE: 55 minutes with more than 50% time spent in coordination of care. ____________________________ Molinda BailiffSrikar R. Shelsy Seng, MD srs:vtd D: 08/29/2011 18:38:22 ET   T: 08/29/2011 19:50:45 ET   JOB#: [id number] cc: Wardell HeathSrikar R. Custer Pimenta, MD, <Dictator> Corky DownsJaved Masoud, MD Orie FishermanSRIKAR R Howard Patton MD ELECTRONICALLY SIGNED 09/03/2011 13:48

## 2014-05-23 NOTE — Discharge Summary (Signed)
PATIENT NAME:  [name], [name] MR#:  [id number] DATE OF BIRTH:  02-Jan-1956  DATE OF ADMISSION:  08/23/2011 DATE OF DISCHARGE:  08/24/2011  PRIMARY CARE PHYSICIAN:  Dr. Juel BurrowMasoud.   DISCHARGE DIAGNOSES:  1. Musculoskeletal chest pain versus fibromyalgia. 2. Chronic pain syndrome. 3. Hypertension.  4. History of pulmonary embolism, on Coumadin. 5. Benign prostatic hypertrophy.  6. Tobacco abuse.   CONSULT: Dr. Juel BurrowMasoud of cardiology.   IMAGING STUDIES: CT of the abdomen and pelvis with contrast showed no acute abnormalities.   ADMITTING HISTORY AND PHYSICAL: Please see detailed history and physical dictated on 08/23/2011. In brief, a 59 year old male patient who has had chronic chest pain who presented to the Emergency Room complaining of palpitations and chest pain. The patient was admitted for further work-up and to rule out acute coronary syndrome.   HOSPITAL COURSE: The patient was admitted on the telemetry monitored floor. Dr. Juel BurrowMasoud of cardiology was consulted for further input with the case as the patient has been following up with him for many years. The patient had three sets of cardiac enzymes which were normal. On his tele he did not have any arrhythmias or bradycardia or any tachycardic episodes. He did have further chest pains during the hospital stay which were noncardiac and pleuritic. Dr. Juel BurrowMasoud of cardiology saw the patient and says that this is chronic musculoskeletal chest pain versus fibromyalgia and suggested that the patient be discharged home to follow up with him in the clinic.   At the time of discharge, the patient's blood pressure is 143/87, pulse of 62, saturating 96% on room air and being afebrile, discharged home in stable condition.   DISCHARGE MEDICATIONS:  1. Finasteride 5 mg oral once a day.  2. Oxybutynin 10 mg oral once a day.  3. Nexium 40 mg oral 2 times a day.  4. Metoprolol succinate 100 mg oral once a day.  5. Coumadin 7.5 mg oral once a day.   6. Acetaminophen/oxycodone 325/5 mg, two tablets orally every four hours as needed for pain. No prescription given.  7. Flexeril 10 mg oral 2 times a day as needed.  8. Albuterol 2 puffs inhaled every four hours as needed for wheezing or shortness of breath.  9. Nicorette 4 mg oral transmucosal gum as needed.   DISCHARGE INSTRUCTIONS: Follow-up with [facility]Masoud [facility].   DIET: Cardiac diet.   ACTIVITY: As tolerated. Return to the Emergency Room if symptoms worsen.   TIME SPENT ON THIS DICTATION INCLUDING COORDINATING CARE AND COUNSELING OF THE PATIENT: 35 minutes.   ____________________________ Molinda BailiffSrikar R. Ashea Winiarski, MD srs:ap D: 08/24/2011 14:14:27 ET T: 08/25/2011 14:08:33 ET JOB#: [id number]  cc: Wardell HeathSrikar R. Ciearra Rufo, MD, <Dictator> Corky DownsJaved Masoud, MD Orie FishermanSRIKAR R Jeanpierre Thebeau MD ELECTRONICALLY SIGNED 09/03/2011 13:51

## 2014-05-23 NOTE — Consult Note (Signed)
PATIENT NAME:  [name], Neamiah J MR#:  [id number] DATE OF BIRTH:  04-30-1955  DATE OF CONSULTATION:  08/30/2011  CONSULTING PHYSICIAN:  Ezzard StandingPaul Y. Rockell Faulks, MD  REASON FOR REFERRAL: Rectal bleeding with abdominal pain.   DESCRIPTION: The patient is a 59 year old black male with a known history of pulmonary embolism and atrial fibrillation, on Coumadin, who has chronic chest pain, who came to the Emergency Room complaining of increasing lower abdominal pain and cramping associated with rectal bleeding. Initially, the blood was more bright in color, but over time the blood became more black in color as well. The patient recalls having an upper endoscopy and colonoscopy as well as a video capsule study done at [facility] a year ago. He does not know the exact details but was told that the doctors could not find an obvious source of the bleeding at that time.   The patient also was concerned about pain control. He has chronic chest pain but is not having abdominal pain. He was also concerned that withholding of the Coumadin he may be at risk for developing recurrent clots. When he presented, his hemoglobin was 9.9. His baseline is around 10.0. INR was 1.5, normally it is supposed to be around 3.5. Marland Kitchen.   The patient has been at various different medical centers, and he would [facility] he had his [facility] work-up. He also recalls having some intestinal [facility] at [facility] several years ago. He could not tell me whether it was in the small intestine or the large intestine.   PAST MEDICAL HISTORY:  1. History of stroke. 2. Pulmonary embolism.  3. Hypertension.  4. Gastroesophageal reflux disease.  5. He does have coronary artery disease. His last cardiac catheterization in February of 2012 showed 50% LAD, 50% first diagonal, 25% to 30% RCA. There was some moderate LV dysfunction. There was also left subclavian stenosis.    PAST SURGICAL HISTORY:  1. Hernia repair. 2. Transurethral  resection of prostate. 3. Pacemaker.  4. Fistula repair.   SOCIAL HISTORY: He smokes  a cigar a day. No alcohol history.   ALLERGIES: Dilaudid, nitroglycerin, tramadol, Zetia,  Pradaxa, Cipro and Celebrex.   HOME MEDICATIONS: Percocet every 4 hours p.r.n., Flexeril, finasteride, metoprolol, Nexium twice a day, multivitamins, oxybutynin, and Coumadin.   REVIEW OF SYSTEMS: There is no fever or chills. No visual or hearing changes. He does have chronic chest pain. There are no palpitations. There is no coughing or shortness of breath. GI symptoms have been described already. The patient denies any nausea, vomiting, heartburn, or indigestion at this time.   FAMILY HISTORY: Notable for diabetes and heart disease.   PHYSICAL EXAMINATION:  GENERAL: The patient appears to be in no acute distress although he says he is in a lot of pain.    VITAL SIGNS: He is afebrile. His vital signs are okay. Initial blood pressure is 145/77.   HEENT: Normocephalic, atraumatic head. Pupils are equally reactive. Throat was clear.   NECK: Supple.   CARDIAC: Regular rhythm and rate without murmurs.   LUNGS: Lungs are clear bilaterally.   ABDOMEN: Normoactive bowel sounds, soft. There is more tenderness in the lower abdomen than the upper abdomen. The patient had active bowel sounds. There is no hepatomegaly. There are no palpable masses.   EXTREMITIES: No clubbing, cyanosis, or edema.   NEUROLOGIC: Examination is nonfocal.   SKIN: Examination is okay.  LABORATORY, DIAGNOSTIC AND RADIOLOGICAL DATA: Sodium 138, potassium was 3.7, chloride 106,  CO2 25, BUN 10, glucose 93. Iron level was 20, which was low. TIBC is high at 511. Lipase is normal at 176. Ferritin level is low normal at 9.0. Liver enzymes are normal. CPK enzymes with troponin levels are normal. His initial hemoglobin was 9.9 yesterday and now is 9.3. MCV is 60. Platelet count is 290. INR is only 1.5. Urinalysis is negative.   ASSESSMENT AND PLAN:  This is a patient with known history of clots who is on chronic Coumadin, now has evidence of gastrointestinal bleeding. I suspect because of the location of the pain that it is lower gastrointestinal bleeding, but the patient also has dark black stools which can be from the upper gastrointestinal tract. The patient is dissatisfied about not getting his pain medications. He wants to be transferred to [facility] where his work-up has been recently. I will contact the hospitalist about transferring the patient to [facility]. If [facility] does not have a bed available or refuses admission, then we will monitor his hemoglobin and transfuse as necessary. We may have to give him some pain medication. Finally, we may need to repeat the colonoscopy and upper endoscopy to find the source of the bleeding once the INR is lower. We will try to get records from [facility] for his previous reports.   Thank you for the referral.  ____________________________ Ezzard Standing. Bluford Kaufmann, MD pyo:cbb D: 08/31/2011 07:43:42 ET T: 08/31/2011 11:48:01 ET JOB#: [id number]  cc: Ezzard Standing. Bluford Kaufmann, MD, <Dictator>  Ezzard Standing Ladashia Demarinis MD ELECTRONICALLY SIGNED 09/01/2011 13:52

## 2014-05-23 NOTE — Discharge Summary (Signed)
PATIENT NAME:  [name], Other J MR#:  [id number] DATE OF BIRTH:  [birth year]  DATE OF ADMISSION:  08/29/2011 DATE OF DISCHARGE:  08/29/2021   TYPE OF DISCHARGE: Transfer to [facility].   DIAGNOSES:  1. GI bleed, possible acute blood loss anemia and chronic microcytic anemia. 2. Paroxysmal atrial fibrillation. 3. History of PE and recurrent DVTs.  4. Hypertension.  5. Chronic pain syndrome with drug seeking behavior. 6. Smoking.  7. Chronic chest pain, possibly musculoskeletal versus fibromyalgia.  8. History of cerebrovascular accident. 9. Coronary artery disease. 10. Gastroesophageal reflux disease. 11. Hypertension. 12. Left subclavian stenosis. 13. Recurrent left upper extremity DVT.   DISPOSITION: The patient is being [facility] as per his request.   LABORATORY, DIAGNOSTIC, AND RADIOLOGICAL DATA: Urinalysis showed no evidence of infection. White count normal, 9.2, hemoglobin 9.3 to 9.9, platelet count normal. Iron 20. IBC 511. Ferritin 9. Unbound iron binding capacity 491. Iron saturation 4. Rest of CMP normal. Cardiac enzymes negative.   CURRENT MEDICATIONS:  1. Normal saline at 75 mL/hour. 2. Tylenol 650 mg q.4 hours p.r.n.  3. Tylox 5/325 1 to 2 tablets q.6 hours p.r.n.  4. Flexeril 10 mg q.8 hours p.r.n.  5. Benadryl 25 mg q.6 hours p.r.n.  6. Nicotine gum 2 mg q.1 hour p.r.n.  7. Zofran 4 mg IV q.4 hours p.r.n.  8. Proscar 5 mg daily.  9. Toprol-XL 50 mg daily.  10. Oxybutynin ER 10 mg at bedtime.   CONSULTATION: GI consultation with Dr. Ashley Jacobsh    HOSPITAL COURSE: The patient is a 59 year old male with history of CVA, CAD, PE, recurrent DVTs, chronic atrial fibrillation, gastroesophageal reflux disease, and hypertension on Coumadin who presented with history of bleeding per rectum and subsequent melena. In the ED the patient was found to have melanotic stool and was strongly guaiac-positive. He has [facility]. [facility]. He also had end[facility] and [facility] which showed some polyps and was otherwise normal. He was on Coumadin for atrial fibrillation and PE. However, his INR was only 1.5. He was admitted to the hospital and his hemoglobin and hematocrit was monitored which remained stable. He was evaluated by Dr. Bluford Kaufmannh. The patient did not have any stools/bleeding since admission to [facility]. He did not want any work-up at [facility] and [facility]. [facility] transfer center was contacted and the patient's condition was discussed with the hospitalist. The patient was [facility]. [facility] also wanted the patient to know that if his insurance will not pay for the transfer[facility]hospitalization at [facility] he will be responsible for his medical bill. The patient was informed in detail that he will be going [facility] and not to [facility] and that he may be liable for his own all medical bill if his insurance does not pay for his transfer/hospitalization at [facility] since he is personally requesting transfer and does not need a higher level of care. The patient fully understood still wanted [facility]. He is going to be transferred [facility] when a bed becomes available. He has been started on a full liquid diet on the recommendation of the gastroenterologist. His hemoglobin has remained stable and there was no indication for transfusion. His iron studies show low iron and high binding capacity consistent with anemia of iron disease consistent with anemia of iron deficiency. His paroxysmal atrial  fibrillation has remained controlled. He wanted to discuss with his PCP regarding IVC filter placement. He recurrently requested IV morphine although he was not in any distress. He does have a history of narcotic seeking behavior. His hypertension remained well controlled. He will be  transferred to [facility]. He remained hemodynamically stable during the hospitalization.        TIME SPENT: 45 minutes.   ____________________________ Darrick Meigs, MD sp:drc D: 08/30/2011 11:56:13 ET T: 08/30/2011 12:19:40 ET JOB#: [id number]  cc: Darrick Meigs, MD, <Dictator> Darrick Meigs MD ELECTRONICALLY SIGNED 08/30/2011 15:21

## 2014-05-26 NOTE — H&P (Signed)
PATIENT NAME:  [name], [name] MR#:  [id number] DATE OF BIRTH:  [birth year]  DATE OF ADMISSION:  09/09/2012  PRIMARY CARE PHYSICIAN: Dr. Corky DownsJaved Masoud.   PRIMARY CARDIOLOGIST: [facility].   REFERRING PHYSICIAN: Dr. Bayard Malesandolph Brown.   CHIEF COMPLAINT: Chest pain. "I have atrial fibrillation."   HISTORY OF PRESENT ILLNESS: [name] is a 59 year old African American male with history of coronary artery disease, cardiomyopathy. We do not have echocardiogram showing the ejection fraction. Pacemaker placement, paroxysmal atrial fibrillation on chronic anticoagulation. Presented to the Emergency Department with complaints of chest pain, stating atrial fibrillation. The patient states was experiencing palpitations which woke him up from sleep. Waited for 20 minutes. Concerned about this. Came to the Emergency Department. Workup in the Emergency Department with EKG showed normal sinus rhythm with well rate controlled. The patient also states that has been experiencing chest pain in the midsternal area, 10/10 intensity, associated with some shortness of breath and nausea. Had small episode of vomiting. Workup in the Emergency Department with EKG and cardiac enzymes were completely unremarkable. The patient [facility] hospitals, [facility]. [facility] and [facility]. Last visit was in May 2014. The patient was discharged home from the Emergency Department. The patient had last heart cath in 2012 which showed 50% lesions in the LAD and first diagonal, otherwise 25% lesions in other vessels.    PAST MEDICAL HISTORY:  1. Coronary artery disease. Last heart cath per our records in 2012. Unknown history from [facility].  2. History of CVA.  3. Paroxysmal atrial fibrillation, on chronic anticoagulation.  4. Mitral valve replacement with St. Jude valve.  5. Hypertension.  6. Hyperlipidemia.  7. History of pneumothorax.  8. History of DVT and PE.    9.  Gastroesophageal reflux disease.  10. Hernia repair.  11. Benign prostatic hypertrophy, status post TURP.  12. Anal fistula repair.  12. Pacemaker/defibrillator placement.   ALLERGIES: DILAUDID, CELEBREX, PRADAXA, ZETIA, LYRICA, CIPRO, TRAMADOL, NITROGLYCERIN.   HOME MEDICATIONS:  1. Ambien 10 mg at bedtime.  2. Coumadin 7.5 mg once a day.  3. Viagra once a day.  4. Oxybutynin 10 mg once a day.  5. Nexium 40 mg 2 times a day.  6. Multivitamin 1 tablet once a day.  7. Toprol-XL 100 mg once a day.  8. Lovastatin 40 mg daily.  9. Flexeril 10 mg 2 times a day at bedtime.  10. Finasteride 5 mg 1 tablet once a day.  11. Ferrous sulfate 325 mg once a day.  12. Diazepam 5 mg 1 tablet 3 times a day.  13. Percocet 5/325 mg 2 tablets every 6 hours as needed.   SOCIAL HISTORY: Smokes cigars. Disabled. Lives by himself. There was drug-seeking behavior at multiple locations.    FAMILY HISTORY: Positive for diabetes mellitus and heart disease.   REVIEW OF SYSTEMS:  CONSTITUTIONAL: Generalized weakness.  EYES: No change in vision.  ENT: No change in hearing.  RESPIRATORY: No cough, shortness of breath.  CARDIOVASCULAR: Has chest pain, palpitations.  GASTROINTESTINAL: Has nausea. One episode of vomiting. No diarrhea.  GENITOURINARY: No dysuria or hematuria.  SKIN: No rash or lesions.  HEMATOLOGY: No easy bruising or bleeding.  ENDOCRINE: No polyuria or polydipsia.  MUSCULOSKELETAL: No joint pains and aches.  NEUROLOGIC: The patient has no weakness or numbness in any part of the body.   PHYSICAL EXAMINATION:  GENERAL: This is a well-built, well-nourished, age-appropriate male lying down in the bed, not  in distress.  VITAL SIGNS: Temperature 98.1, pulse 70, blood pressure 159/90, respiratory rate of 18, oxygen saturation 100% on room air.  HEENT: Head normocephalic, atraumatic. There is no scleral icterus. Conjunctivae normal. Pupils equal and react to light. Mucous membranes moist. No  pharyngeal erythema.  NECK: Supple. No lymphadenopathy. No JVD. No carotid bruit. No thyromegaly.  CHEST: Has mild focal tenderness in the midsternal area.  LUNGS: Bilaterally clear to auscultation.  HEART: S1, S2 regular. No murmurs are heard. No pedal edema. Pulses 2+.  ABDOMEN: Bowel sounds present. Soft, nontender, nondistended. No hepatosplenomegaly.  SKIN: No rash or lesions.  MUSCULOSKELETAL: Good range of motion in all of the extremities.  NEUROLOGIC: The patient is alert, oriented to place, person and time. Cranial nerves II through XII intact. Motor 5/5 in upper and lower extremities. No sensory deficits.   LABS: CBC and CMP are completely within normal limits. Troponin less than 0.02. CK 338. CK-MB 1.7.   EKG, 12-lead: Normal sinus rhythm with no ST-T wave abnormalities.   Chest x-ray, 1 view portable: No acute cardiopulmonary disease.   ASSESSMENT AND PLAN: [name] is a 59 year old male who comes to the Emergency Department stating that he is in atrial fibrillation.  1. Chest pain: This seems to be more of a musculoskeletal pain. Concerning the patient's extensive visits, will admit the patient under observation. Cycle cardiac enzymes x3. If negative, the patient could be discharged home to follow up at [facility].  2. Atrial fibrillation: Currently in normal sinus rhythm. Continue the Toprol-XL.  3. History of coronary artery disease: The patient goes to multiple facilities seeking medical help; however, the patient does not have any current indication for any intervention. Continue with the home medications.  4. Keep the patient on deep vein thrombosis prophylaxis . Will check the PT/INR as the patient is on Coumadin. The patient also states that he has St. Jude valve. If the patient has subtherapeutic INR, will start the patient on heparin drip. Will wait for the results.    ____________________________ Susa Griffins, MD pv:gb D: 09/09/2012 04:16:19  ET T: 09/09/2012 04:55:30 ET JOB#: [id number]  cc: Susa Griffins, MD, <Dictator> Susa Griffins MD ELECTRONICALLY SIGNED 10/06/2012 21:50

## 2014-05-26 NOTE — Discharge Summary (Signed)
PATIENT NAME:  [name], [name] DATE OF BIRTH:  1955/02/26  DATE OF ADMISSION:  09/09/2012 DATE OF DISCHARGE:  09/10/2012  PRIMARY CARE PHYSICIAN: Shawn DownsJaved Masoud, MD  DISCHARGE DIAGNOSES: 1.  Paroxysmal atrial fibrillation.  2.  Chest pain secondary to rapid ventricular rate.  3.  Coronary artery disease.  4.  Chronic pain syndrome.   CONSULTANTS: Dr. Gwen Harrison of cardiology.   IMAGING STUDIES: Include a chest x-ray which showed chronic changes.   ADMITTING HISTORY AND PHYSICAL: Please see detailed H and P dictated on 09/09/2012. In brief, this is a 59 year old African American male patient with history of coronary artery disease, cardiomyopathy and atrial fibrillation, presented to the hospital complaining of chest pain and palpitations. The patient was found to have atrial fibrillation with rapid ventricular rate briefly and returned to normal sinus rhythm, but with chest pain was admitted to the hospitalist service to rule out acute coronary syndrome. The patient had normal cardiac enzymes. Did have 1 episode of sustained rapid ventricular rate for about 15 to 20 minutes and returned to normal sinus rhythm. Dr. Gwen Harrison saw the patient, suggested no change in medications as he had significant bradycardia with Cardizem in the past. Metoprolol was continued and the patient has been asked to follow up with his [facility] cardiologist, where he is being planned for an ablation.   His INR during the hospital stay was 2.6 and 3.1.   Today, the patient does not complain of any chest pain. Tele over the last 24 hours has shown heart rate in the 60s. Heart examination shows S1, S2 regular without any murmurs.   DISCHARGE MEDICATIONS: Include: 1.  Nexium 40 mg oral 2 times a day.  2.  Oxybutynin 10 mg oral once a day.  3.  Finasteride 5 mg oral once a day.  4.  Coumadin 7.5 mg oral once a day.  5.  Flexeril 10 mg 2 tablets oral once a day at bedtime as needed.  6.  Multivitamin 1 tablet  oral once a day.  7.  Lovastatin 40 mg oral once a day.  8.  Zolpidem 10 mg oral once a day.  9.  Viagra 100 mg oral once a day as needed.  10.  Acetaminophen/oxycodone 325/5, 2 tablets oral every 6 hours as needed for pain. 11.  Metoprolol succinate extended-release 100 mg oral once a day.  12.  Ferrous sulfate 325 mg oral once a day.  13.  Diazepam 5 mg oral 3 times a day as needed.  14.  Metoprolol tartrate 25 mg oral 2 times a day as needed for palpitations.   DISCHARGE INSTRUCTIONS: Low-salt diet. Activity as tolerated. [facility] regarding atrial fibrillation. I have advised the patient to use his 25 mg metoprolol as needed for any palpitations he has.  TIME SPENT: Discharge time spent today was 35 minutes.  ____________________________ Shawn BailiffSrikar R. Kestrel Mis, MD srs:jm D: 09/10/2012 13:40:52 ET T: 09/10/2012 15:39:54 ET JOB#: [id number]  cc: Shawn HeathSrikar R. Asani Deniston, MD, <Dictator> Shawn DownsJaved Masoud, MD Shawn FishermanSRIKAR R Jozlin Bently MD ELECTRONICALLY SIGNED 09/20/2012 8:40

## 2014-05-26 NOTE — Consult Note (Signed)
General Aspect Called to see 59 yo male with history of intermitant afib on anticoaguilation, history of cardiomyopathy s/p aicd placement with a St. Jude device. Pt has been getting his medic al and cardiac care at multiple locations includiing [facility], [place], [place] and has hads in patient visits at [facility]. He states he is followed currently at [facility] but is not aware of his doctors name. He has insignficant cad by cath at [facility] in the past. He presented to the er with compolaints of flank pan and abdominal pain and states his chest is flutttering. EKG and telemetry reveal nsr at rates of 55-70. Interogation of his device reveals it to be set between 50 and 130 with episodic nonsustained afib. The most recent afib episode was 5/20. He has been on deftilide but states he has stopped it 3 weeks to a month ago due to nausea and vomitine. He has ruled out for mi with normal troponin. No evidence of chf.   Physical Exam:  GEN no acute distress, obese   HEENT pink conjunctivae, PERRL, hearing intact to voice   NECK No masses   RESP normal resp effort  clear BS   CARD Regular rate and rhythm  Normal, S1, S2  No murmur   ABD denies tenderness  soft   LYMPH negative neck   EXTR negative cyanosis/clubbing   SKIN No rashes   NEURO cranial nerves intact, motor/sensory function intact   PSYCH A+O to time, place, person   Review of Systems:  Subjective/Chief Complaint abdominal and right flank pain   General: No Complaints   Skin: No Complaints   ENT: No Complaints   Eyes: No Complaints   Neck: No Complaints   Respiratory: No Complaints   Cardiovascular: Chest pain or discomfort   Gastrointestinal: Heartburn   Genitourinary: No Complaints   Vascular: No Complaints   Musculoskeletal: No Complaints   Neurologic: No Complaints   Hematologic: No Complaints   Endocrine: No Complaints   Psychiatric: No Complaints   Review of Systems: All other systems were reviewed  and found to be negative   Medications/Allergies Reviewed Medications/Allergies reviewed   Home Medications: Medication Instructions Status  diazepam 5 mg oral tablet 1 tab(s) orally 3 times a day- as needed  Active  Nexium 40 mg oral delayed release capsule 1 cap(s) orally 2 times a day Active  oxybutynin 10 mg/24 hr oral tablet, extended release 1 tab(s) orally once a day (at bedtime) Active  finasteride 5 mg oral tablet 1 tab(s) orally once a day Active  warfarin 7.5 mg oral tablet 1 tab(s) orally once a day Active  Flexeril 10 mg oral tablet 2 tab(s) orally once a day (at bedtime) as needed for muscle spasm. Active  multivitamin 1 tab(s) orally once a day Active  lovastatin 40 mg oral tablet 1 tab(s) orally once a day Active  zolpidem 10 mg oral tablet 1 tab(s) orally once a day (at bedtime) Active  Viagra 100 mg oral tablet 1 tab(s) orally once a day, As Needed Active  acetaminophen-oxycodone 325 mg-5 mg oral tablet 2 tab(s) orally every 6 hours, As Needed- for Pain  Active  Metoprolol Succinate ER 100 mg oral tablet, extended release 1 tab(s) orally once a day Active  ferrous sulfate 325 mg (65 mg elemental iron) oral tablet 1 tab(s) orally once a day Active   EKG:  EKG Interp. by me  NSR   Interpretation normal sinus rhythm.  Normal EKG    Dilaudid: N/V  Zetia: GI Distress, Cough  Cipro: Tachycardia  Celebrex: Bleeding  Pradaxa: Bleeding  Lyrica: N/V/Diarrhea  TraMADOL Hydrochloride ER: Unknown  Nitroglycerin: Unknown   Impression 59 year old male with atrial fibrillation, now maintaining normal sinus rhythm , has a history of cardiomyopathy with aicd pacemaker in place with settings at 50-130. Interogartion reveals intermitant afib which is nonsustained. No ventricular arrtyhmias. Has self discontinued his tikosyn. [facility]. No evidence of pulmonary embolus, chf or other chest pathology on chest cty. CXR without chf. In nsr at present. Has been  compliant with his coumadin as inr is therapeutic.   Plan 1. Continue with metoprolol at outpatinet dose 2. Continue with warfarin 3. OK for discharge form mcardiac standpoint 4. FOllow up in 1-2 weeks [facility] 5. Low sodiuim diet.   Electronic Signatures: Dalia HeadingFath, Ceyda Peterka A (MD)  (Signed 23-May-14 17:28)  Authored: General Aspect/Present Illness, History and Physical Exam, Review of System, Home Medications, EKG , Allergies, Impression/Plan   Last Updated: 23-May-14 17:28 by Dalia HeadingFath, Corde Antonini A (MD)

## 2014-05-26 NOTE — Consult Note (Signed)
General Aspect 59 year old male with history of CAD, last cardiac catheterization 2012 with 50% percent LAD lesion and first diagonal , with some smaller 25% lesions in other vessels, mitral valve replacement St. Jude valve, history of DVT PE, history of pacer /defibrillator, history of CAD, EF unknown, but denies stents or bypass, history of chronic atrial fibrillation with ablation 2 years ago as well as treatment with Tikosyn which was stopped at some time in the remote past due to making him feel sick.he presented with chest pain which he states is worse when he is in atrial fibrillation.  Patient currently appears very comfortable but states he is having 8/10 chest pain and states he would prefer to have more pain to get his pain below 5.  He reports he had his defibrillator interrogated 2 weeks ago by his electrophysiologist Dr. Sharon Seller at [facility].  This did show the paroxysmal A. fib.  It was discussed with patient that he may need a second ablation.  Patient states he gets terrible chest pain and a throbbing headache when he is A. fib.  Review of his monitor shows that he had 3 episodes of A. fib lasting less than 15 mins with a max heart rate of 116.  The patient would like to be transferred to [facility] urgently for an ablation.  His cardiac enzymes have been negative.  For the majority of the time he is maintaining sinus rhythm. He states he had a CVA in the past when lisinopril was combined with beta blockers and caused hypotension.  He currently is in the 80s but his pacemaker should pace him if he has any significant bradycardia.  I did suggest increasing beta blocker or diltiazem hopefully to prevent breakthrough A. fib but he does not want to do this for fear of hypotension.   Physical Exam:  GEN well developed, no acute distress   HEENT pink conjunctivae   RESP normal resp effort  clear BS   CARD Regular rate and rhythm  majority of the time sinus rhythm with very short episodes of  A. fib that are fairly well rate controlled   ABD denies tenderness  normal BS   EXTR negative edema   SKIN normal to palpation, skin turgor good   NEURO cranial nerves intact, motor/sensory function intact   PSYCH alert, A+O to time, place, person, showing some drug-seeking behavior   Review of Systems:  Subjective/Chief Complaint chest pain 8/10 at rest 10 /10 with A. fib   Cardiovascular: Chest pain or discomfort  at rest, worse with A. fib   Medications/Allergies Reviewed Medications/Allergies reviewed   Lab Results: Hepatic:  07-Aug-14 01:28   Bilirubin, Total 0.3  Alkaline Phosphatase 120  SGPT (ALT) 28  SGOT (AST) 27  Total Protein, Serum 7.4  Albumin, Serum 3.4  Routine Chem:  07-Aug-14 01:28   Glucose, Serum  141  BUN 9  Creatinine (comp) 1.25  Sodium, Serum 138  Potassium, Serum 3.5  Chloride, Serum 105  CO2, Serum 27  Calcium (Total), Serum 8.5  Osmolality (calc) 277  eGFR (African American) >60  eGFR (Non-African American) >60 (eGFR values <65m/min/1.73 m2 may be an indication of chronic kidney disease (CKD). Calculated eGFR is useful in patients with stable renal function. The eGFR calculation will not be reliable in acutely ill patients when serum creatinine is changing rapidly. It is not useful in  patients on dialysis. The eGFR calculation may not be applicable to patients at the low and high extremes of body  sizes, pregnant women, and vegetarians.)  Anion Gap  6  Cardiac:  07-Aug-14 01:28   CPK-MB, Serum 1.7 (Result(s) reported on 09 Sep 2012 at 02:05AM.)  Troponin I < 0.02 (0.00-0.05 0.05 ng/mL or less: NEGATIVE  Repeat testing in 3-6 hrs  if clinically indicated. >0.05 ng/mL: POTENTIAL  MYOCARDIAL INJURY. Repeat  testing in 3-6 hrs if  clinically indicated. NOTE: An increase or decrease  of 30% or more on serial  testing suggests a  clinically important change)  CK, Total  338  Routine Coag:  07-Aug-14 01:28   Prothrombin  26.8   INR 2.6 (INR reference interval applies to patients on anticoagulant therapy. A single INR therapeutic range for coumarins is not optimal for all indications; however, the suggested range for most indications is 2.0 - 3.0. Exceptions to the INR Reference Range may include: Prosthetic heart valves, acute myocardial infarction, prevention of myocardial infarction, and combinations of aspirin and anticoagulant. The need for a higher or lower target INR must be assessed individually. Reference: The Pharmacology and Management of the Vitamin K  antagonists: the seventh ACCP Conference on Antithrombotic and Thrombolytic Therapy. MPNTI.1443 Sept:126 (3suppl): [id number]. A HCT value >55% may artifactually increase the PT.  In one study,  the increase was an average of 25%. Reference:  "Effect on Routine and Special Coagulation Testing Values of Citrate Anticoagulant Adjustment in Patients with High HCT Values." American Journal of Clinical Pathology 2006;126:400-405.)  Routine Hem:  07-Aug-14 01:28   WBC (CBC) 8.5  RBC (CBC) 5.66  Hemoglobin (CBC)  12.3  Hematocrit (CBC)  38.1  Platelet Count (CBC) 255 (Result(s) reported on 09 Sep 2012 at 01:58AM.)  MCV  67  MCH  21.8  MCHC 32.4  RDW  21.9    Dilaudid: N/V  Zetia: GI Distress, Cough  Cipro: Tachycardia  Celebrex: Bleeding  Pradaxa: Bleeding  Lyrica: N/V/Diarrhea  TraMADOL Hydrochloride ER: Unknown  Nitroglycerin: Unknown  Vital Signs/Nurse's Notes: **Vital Signs.:   07-Aug-14 17:07  Vital Signs Type Routine  Temperature Temperature (F) 98.8  Celsius 37.1  Temperature Source oral  Pulse Pulse 60  Respirations Respirations 29  Systolic BP Systolic BP 154  Diastolic BP (mmHg) Diastolic BP (mmHg) 85  Mean BP 98  Oxygen Delivery Room Air/ 21 %    Impression 59 year old male with severity of chest pain that seems out of context to clinical presentation with history of drug-seeking behavior, mitral valve replacement, on  anticoagulant, with paroxysmal atrial fibrillation which he states is aggravating his chest pain, with previous ablation and intolerance of tikosyn, wanting transfer to [facility] for urgent ablation.   Plan 1.  Patient does not want to go up on metoprolol or Cardizem for better control of A. fib for fear of hypotension with hypotension in the past contributing to his CVA, per patient. 2.  Chest pain presentation seems atypical for CAD with history of drug-seeking behavior, and patient asking for morphine. 3.  I'm not sure if there's anything we can do for his A. fib management with this hospitalization, since he deferred additional rate control. he mostly is in sinus rhythm with a very short duration of paroxysmal A. fib  Will have to follow up with his EP at [facility] to further discuss plans for ablation.  I don't know if this fits criteria for transfer to be done urgently. 4.  Dr. Nehemiah Massed to further discuss treatment plans with patient.   Electronic Signatures: Roderic Palau (NP)  (Signed 07-Aug-14 18:46)  Authored: General Aspect/Present  Illness, History and Physical Exam, Review of System, Labs, Allergies, Vital Signs/Nurse's Notes, Impression/Plan   Last Updated: 07-Aug-14 18:46 by Roderic Palau (NP)

## 2014-05-28 NOTE — H&P (Signed)
PATIENT NAME:  [name], [name] MR#:  [id number] DATE OF BIRTH:  [birth year]  DATE OF ADMISSION:  03/24/2011  REFERRING PHYSICIAN: Dr. Dolores Harrison    PRIMARY CARE PHYSICIAN: [name]    PRESENTING COMPLAINT: Facial numbness, left side numbness and weakness with chest pain and chest numbness.   HISTORY OF PRESENT ILLNESS: [name] is a 59 year old gentleman well known to the hospitalist group with history of chronic pain, recurrent chest pain and left upper extremity pain, coronary artery disease, recurrent left upper extremity DVT, bilateral PEs, history of CVA, hypertension status post ICD presenting with complaints of facial numbness after supper this evening. Reports also chest pain and numbness with left-sided weakness and numbness. Reports also difficulty with opening his left eye and has been having headache on and off. Reports earlier in the day he felt atrial fibrillation/atrial flutter, did present in normal sinus rhythm. Blood pressure on presentation was 149/82. Denies any syncope. He reports that he has not had improvement in his symptoms but his chest pain has improved after morphine. He also reports history of anemia and has been getting GI work-up with endoscopy, colonoscopy, and capsule endoscopy but three days ago did start seeing bloody stools. In the ED he was found to have frank hematochezia.   PAST MEDICAL HISTORY:  1. Last admitted here from 11/15 to 12/20/2010 for again chest pain evaluation. During that admission it was noted that there were concerns for drug-seeking behavior. He has had multiple doctor's visits and hospital visits for complaints of chest pain and left upper extremity pain. Has had multiple venograms as well as PCTAs. The patient has been fired from [name] office.  2. 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  3. Recurrent left upper extremity DVT.  4. Bilateral PEs.  5. Fibromyalgia.  6. Anemia.  7. Hypertension.  8. Paroxysmal  atrial fibrillation, status post ablation at [facility], admitted 02/03 to 03/12/2011.  9. Thrombophlebitis of left arm.  10. Coronary artery disease. Catheterization in February 2012 with 50% LAD stenosis, 50% first diagonal stenosis, and 25% left circumflex. Medical management was recommended at the time.  11. 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 12. The patient has had a negative hypocoagulable work-up.  13. Neuropathy.  14. Hyperlipidemia.  15. Chronic obstructive pulmonary disease and ongoing tobacco abuse.  16. Probable obstructive sleep apnea.   PAST SURGICAL HISTORY:  1. Ablation February 2013 as above.  2. Automatic implantable cardiac defibrillator.  3. Bilateral groin hernia repair.  4. Transurethral resection of the prostate in 2010.  5. Anal fissure surgery.   ALLERGIES: Celebrex, Cipro, Dilaudid, nitroglycerin, tramadol, hydrochloride ER, Zetia, and Pradaxa.   MEDICATIONS FROM LAST DISCHARGE AT [facility]:  1. Elavil 50 mg at bedtime.  2. Flexeril 20 mg at bedtime.  3. Nexium 40 mg b.i.d.  4. Iron 325 mg daily.  5. Proscar 5 mg daily.  6. Mevacor 40 mg daily.  7. Toprol-XL 100 mg daily.  8. Zofran 4 mg as needed.  9. Ditropan XL 5 mg daily.  10. Oxycodone CR 10 mg q.i.d.  11. Coumadin 7.5 mg nightly.  12. Tikosyn 500 mcg q.12.  13. Ambien 10 mg at bedtime.   SOCIAL HISTORY: He continues to smoke about 2 to 3 cigarettes per day. Denies any alcohol or drug use. Lives in [place] alone.   FAMILY HISTORY: Heart disease on both parents' side as well as diabetes and cancer. Reports that mother has anemia.   REVIEW OF SYSTEMS: CONSTITUTIONAL: No  fevers, nausea, or vomiting. EYES: Reports difficulty shutting his left eyelid. ENT: No epistaxis or discharge. No dysphagia. RESPIRATORY: No cough, wheezing, hemoptysis. CARDIOVASCULAR: Chest pain as per history of present illness. No orthopnea or edema. He endorses atrial fibrillation/atrial flutter. No presyncope or syncope.  GI: No nausea or vomiting. Reports bright red blood per rectum for the past three days. No hematemesis. GU: No dysuria or hematuria. ENDOCRINE: No polyuria or polydipsia. HEME: No easy bleeding. He has easy bruising. SKIN: No ulcers. MUSCULOSKELETAL: He has occasional joint pain and he is complaining of low back pain right now. NEUROLOGIC: History of stroke with reports of numbness and weakness as per history of present illness. PSYCH: Denies any suicidal ideation.   PHYSICAL EXAMINATION:   VITAL SIGNS: Temperature 97.9, pulse 85, respiratory rate 16, blood pressure 149/82, sating at 97% on room air.   GENERAL: Lying in bed in no apparent distress.   HEENT: Normocephalic, atraumatic. Pupils equal, symmetric, nonicteric. Nares without discharge. No tongue deviation. Moist mucous membrane.   NECK: Soft and supple. No adenopathy or JVP.   CARDIOVASCULAR: Non-tachy, sinus. No murmurs, rubs, or gallops.   LUNGS: Faint basilar crackles. No use of accessory muscles or increased respiratory effort.   ABDOMEN: Soft. Positive bowel sounds. No mass appreciated.   EXTREMITIES: No edema. Dorsal pedis pulses intact.   MUSCULOSKELETAL: No joint effusion. No spinal tenderness.   SKIN: No ulcers.   NEUROLOGIC: He has decreased strength 4/5 of the left side. Sensations intact. Nose-to-finger intact. No pronator drift or asterixis.   PSYCHIATRIC: He is alert and oriented. The patient is cooperative.   PERTINENT LABS AND STUDIES: Glucose 91, BUN 16, creatinine 1.01, sodium 140, potassium 3.9, chloride 106, carbon dioxide 24, calcium 8.2. LFTs within normal limits. WBC 8, hemoglobin 7.8, hematocrit 26.8, platelets 347, MCV 56. Troponin less than 0.02. INR 3.5. EKG with normal sinus, rate of 66. No ST elevation or depression. Chest x-ray without any infiltrate. CT of the head without any significant intracranial abnormalities.   ASSESSMENT AND PLAN: [name] is a 59 year old gentleman with history of atrial  fibrillation status post ablation, recurrent DVTs, subclavian vein stenosis, bilateral PE, anemia, GI bleed, COPD and ongoing tobacco abuse, coronary artery disease, prior history of TIA/CVA, well known to the hospitalist group and with multiple admissions here, [facility], and [facility] for recurrent chest pain, history of chronic pain syndrome presenting with reports of facial numbness and chest pain and numbness with left-sided numbness and weakness. 1. Left-sided paresis and paresthesias, questionable CVA versus TIA on Coumadin who presented with normal sinus rate but reported episode of atrial fibrillation earlier in the day. His INR is therapeutic. This is a complex situation as the patient has GI bleed and hematochezia with worsening anemia. Has had extensive GI work-up. Will stop the Coumadin. Start him on high dose aspirin. Hold off on heparin drip. Neurology consultation to help with complex case and medication recommendations. Unable to pursue MRI in the setting of his AICD. Will obtain carotid Doppler's as well as echocardiogram for now. Will get PT, OT evaluation.  2. Paroxysmal atrial fibrillation with recent ablation. Will restart his Tikosyn, Toprol-XL, and plan as above. Hold his Coumadin. Continue on tele. He is currently normal sinus.  3. Acute on chronic anemia. As above, GI bleed with frank hematochezia on exam. The patient has been consented for transfusion. Will give 2 units. Followed by [facility]. He has had EGD, colonoscopy, and capsule endoscopy. Will need to continue to follow his  hemoglobin and hematocrit as well as restart his iron supplementation.  4. Chronic pain syndrome, concerns for drug seeking behavior. Again, he has had chronic pain with multiple admissions and his current chest pain does not concern me for acute coronary event but will continue to cycle his cardiac enzymes and as above aspirin 325. Restart his statin. Send fasting lipid panel, TSH, and A1c. His catheterization results as  dictated above.  5. COPD, ongoing tobacco abuse, stable. Will start him on nicotine patch.  6. Prophylaxis with aspirin, TEDs, SCDs, and Nexium.    TIME SPENT: Approximately 55 minutes spent on patient care.   ____________________________ Reuel Derby, MD ap:drc D: 03/24/2011 02:16:12 ET T: 03/24/2011 06:14:16 ET JOB#: [id number]  cc: Pearlean Brownie Lerry Cordrey, MD, <Dictator> Corky Downs, MD Reuel Derby MD ELECTRONICALLY SIGNED 04/01/2011 0:48

## 2014-05-28 NOTE — H&P (Signed)
PATIENT NAME:  [name], [name] MR#:  [id number] DATE OF BIRTH:  1955-04-25  DATE OF ADMISSION:  06/04/2011  PRIMARY CARE PHYSICIAN: Dr. Juel BurrowMasoud   CHIEF COMPLAINT: Cough and chest pain.   HISTORY OF PRESENT ILLNESS: Patient is a 59 year old male with multiple medical problems of hypertension, coronary artery disease status post defibrillator pacemaker, history of pulmonary emboli, atrial fibrillation on chronic Coumadin, hypertension came in because patient having severe dry cough for the past two days associated with chest pain due to cough and mild trouble breathing. Patient feels like he has some fever also since two days. Denies any dizziness. No nausea, no vomiting and no other complaints. Patient did receive a dose of  solumedro along wil,th albuterol and morphine and was supposed to be discharged by ER physician but he felt very weak and had persistent cough and I was asked for admission. Patient during my visit was having persistent cough and even when he was talking he was coughing, mainly dry cough, no phlegm. He feels like there is phlegm inside but unable to get it.   PAST MEDICAL HISTORY: 1. Recently was admitted from 03/24 to 03/26 for some weakness and also shortness of breath and dizziness and also possible syncope.  2. Coronary artery disease. 3. Pulmonary emboli. 4. Atrial fibrillation. 5. Seizures. 6. History of multiple admissions in this hospital and also at [facility].  7. Cervical spine disease.  8. Radiculopathy. 9. Fibromyalgia. 10. Left subclavian stenosis.  11. History of hyperlipidemia.  12. History of cerebrovascular accident with left-sided weakness in 2009 and negative hypercoagulable workup.  13. History of coronary artery disease, cardiac catheterization in February 2012 showed 50% LAD stenosis, 50% first diagonal and also 25% left circumflex stenosis.  14. History of recurrent left upper extremity deep venous thrombosis.   PAST SURGICAL HISTORY:  1. Groin hernia  repair. 2. Anal fistula surgery.  3. History of defibrillator placement and ablation.   ALLERGIES: Patient allergic to multiple medications including Celebrex, Cipro, Dilaudid, nitroglycerin, tramadol, Zetia and Pradaxa.   SOCIAL HISTORY: Still smokes 3 to 4 cigars in three days. No drugs. No alcohol. [place] in [place], lives alone. He is trying to quit.  FAMILY HISTORY: Patient's parents have heart disease and diabetes. Mother also had anemia.    REVIEW OF SYSTEMS: CONSTITUTIONAL: Had fever at home and some weakness. EYES: No blurred vision. ENT: No tinnitus. No epistaxis. Patient does have sore throat. RESPIRATORY: Had severe cough for last two days and also painful respirations. Has history of chronic obstructive pulmonary disease. CARDIOVASCULAR: Chest pain in the middle of the chest due to cough. Patient denies orthopnea. No palpitations. GASTROINTESTINAL: No nausea. No vomiting. No abdominal pain. GENITOURINARY: No dysuria. ENDOCRINE: No polyuria, nocturia. INTEGUMENTARY: No skin rashes. MUSCULOSKELETAL: Has joint pains and also fibromyalgia pain. NEUROLOGIC: No numbness. No weakness. PSYCH: No anxiety. No insomnia.  MEDICATIONS: From last discharge summary patient is supposed to be on:  1. Albuterol 2 puffs 4 times daily. 2. Flexeril 10 mg daily. 3. Ambien 10 mg daily.  4. Ferrous sulfate 325 mg daily.  5. Acetaminophen with oxycodone 5/325, 2 tablets orally q.4 hours as needed for pain of fibromyalgia and also back pain.  6. Coumadin 7.5 daily.  7. Metoprolol succinate ER 100 mg daily. 8. Nexium 40 mg p.o. b.i.d.  9. Oxybutynin 10 mg daily. 10. Benadryl 50 mg q.6 hours.  11. Zofran 4 mg q.8 hours as needed.     PHYSICAL EXAMINATION:  VITAL SIGNS: Temperature 98.5, pulse 79,  respirations 20, blood pressure 131/71, sats 100% on room air.   GENERAL: Alert, awake, oriented having persistent cough during my exam.   HEENT: Head atraumatic, normocephalic. Pupils equally reacting to  light. Extraocular movements are intact. ENT: No tympanic membrane congestion. No turbinate hypertrophy. Throat has oropharyngeal erythema.   NECK: Normal range of motion. No JVD. No carotid bruits.   CARDIOVASCULAR: S1, S2 regular. No murmur. No tachycardia. PMI not displaced. Pedal pulses are intact.   LUNGS: Patient has faint expiratory wheeze in all lung fields but no rales.   ABDOMEN: Soft, nontender, nondistended. Bowel sounds present.   EXTREMITIES: No extremity edema. No cyanosis. No clubbing.   NEUROLOGIC: Patient has no focal neurological deficit.   LABORATORY, DIAGNOSTIC AND RADIOLOGICAL DATA: D-dimer 0.27. Electrolytes: Sodium 141, potassium 3.7, chloride 108, bicarbonate 26, BUN 9, creatinine 0.95, glucose 91. Liver function within normal limits. WBC 9.5, hemoglobin 10.5, hematocrit 35.2, platelets 288.   Magnesium 1.7. INR 1.7. Troponin less than 0.02. BNP 79. Chest x-ray shows no acute disease of the chest.   EKG normal sinus, 81 beats per minute with occasional PVCs.   ASSESSMENT AND PLAN: Patient is a 59 year old male with:  1. Persistent cough and pleuritic chest pain for two days with fever. X-ray of the chest has no evidence of pneumonia. Patient is going to be placed in observation for acute bronchitis and persistent cough. Patient's chest pain is likely coming from his bronchitis. Patient's EKG has no changes and troponins have been negative so will not trend the troponins, but continue oxygen, Rocephin, Zithromax, Solu-Medrol along with DuoNebs. Patient will also get some Tussionex or Robitussin for pain and also obtain rapid strep test for throat.   2. Hypertension. Blood pressure is controlled. Continue the home medication with metoprolol.  3. Patient has history of pulmonary emboli and atrial fibrillation. INR is subtherapeutic. Increase the dose of Coumadin to 10 mg tonight and restart 7.5 tomorrow if INR is above 2.  4. Patient has a history of gastroesophageal  reflux disease on Nexium, continue that.  5. History of iron deficiency anemia on ferrous sulfate 325 daily, continue that.  6. Tobacco abuse. Counseled against smoking for three minutes and he said he is trying and continue albuterol and also Advair. Patient will be also started on Spiriva as well.  7. Patient has cervical myalgia and also fibromyalgia. Patient is taking Percocet around the clock at home as well and we will leave up to Dr. Juel Burrow.  8. Patient's condition is stable.   TIME SPENT: About 60 minutes on this patient.  Discussed with Dr. Juel Burrow and signed out to him.   ____________________________ Katha Hamming, MD sk:cms D: 06/04/2011 21:31:42 ET T: 06/05/2011 09:47:26 ET JOB#: [id number]  cc: Katha Hamming, MD, <Dictator> Corky Downs, MD Katha Hamming MD ELECTRONICALLY SIGNED 06/14/2011 23:01

## 2014-05-28 NOTE — H&P (Signed)
PATIENT NAME:  [name], Salman J MR#:  [id number] DATE OF BIRTH:  04/29/1955  DATE OF ADMISSION:  04/27/2011  PRIMARY CARE PHYSICIAN: Corky DownsJaved Masoud, MD   HISTORY OF PRESENT ILLNESS: The patient is a 59 year old African American male with past medical history significant for history of transient ischemic attacks, CVAs, seizures, coronary artery disease, status post defibrillator as well as pacemaker placement for third-degree cardiac block, who presented to the hospital with complaints of blacking out. According to the patient, he was feeling well up until the morning of the day of admission. He was sitting on the couch and suddenly he felt as if weakness was coming on him. He was somewhat short of breath as well as dizzy and presyncopal. He also was having mild chest pains. He started going to the kitchen to get some ice and suddenly blacked out. He remembers reaching for a table to stabilize himself; however, he fell down from a standing position. He woke up but still had to lie down for a few minutes because he was somewhat confused. He also was incontinent with stool as well as  urine. He started cleansing himself up and called EMS. EMS brought him to the hospital. In the hospital, he had lab studies done which were unremarkable; however, because of his episode of syncope and because of known history of coronary artery disease, he was requested to be admitted. He is complaining of significant 9 out of 10 chest pains.  Pain was described as pressure-type pain all across his chest as well as in his back, neck area and shoulder area. Also, he is complaining of some headaches in the occipital area which started after syncope, pressure-type headache radiating to both shoulders. He complains of swelling in his armpits bilaterally and some discomfort in the left arm.   PAST MEDICAL HISTORY: Significant for: 1. History of cerebrovascular accident, questionable transient ischemic attacks. 2. Seizures. 3. Coronary  artery disease, status post myocardial infarction in the past, status post defibrillator, pacemaker placement for third-degree AV block.  4. History of PE.  5. Atrial fibrillation, in sinus rhythm now. 6. History of deep venous thrombosis.  7. Hypertension. 8. Gastrointestinal bleed.  9. Recent admission to the hospital which was likely transient ischemic attack.  10. Episode of hematochezia while in the hospital in February, 2013.  11. History of fibromyalgia.  12. Cervical spine disease with radiculopathy. 13. Left subclavian stenosis. 14. Anemia.  15. Chronic obstructive pulmonary disease.  16. He was admitted in November 2012 for chest pain evaluation. During that admission it was noted that he has some problems with drug-seeking behavior, had multiple doctor visits and hospital visits for complaints of chest pains as well as left upper extremity pains. He had multiple venograms as well as PTCAs. The patient has been fired from Dr. Marijean HeathSchnier's office. 17. Recurrent central venous stenosis, left subclavian venous stenosis. 18. 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 19. History of thrombophlebitis of left arm. 20. History of coronary artery disease. Cardiac catheterization done in February 2012 showed 50% LAD stenosis, 50% first diagonal stenosis, 25% left circumflex. Medical management was recommended at that time. 21. History of CVA with left-sided weakness in May 2009. Negative hypercoagulable work-up. 22. Neuropathy. 23. Hyperlipidemia. 24. Ongoing tobacco abuse.  25. Questionable obstructive sleep apnea.   PAST SURGICAL HISTORY:  1. Ablation in February 2013.  2. Automatic implantable cardiac defibrillator. 3. Bilateral groin hernia repair. 4. Transurethral resection of prostate in 2010.  5. Anal fissure surgery.  ALLERGIES: Celebrex, ciprofloxacin, Dilaudid, nitroglycerin, tramadol, hydrochloride ER, Zetia, as well as Pradaxa.   MEDICATIONS:  It is unclear what  medications he is taking, however, he tells me that he is taking: 1. Coumadin 7.5 mg p.o. daily.  2. He was in the past on Advair Diskus 250/50 twice daily.  3. Albuterol 2 puffs every 4 hours as needed.  4. Metoprolol XL 50 mg p.o. daily.  5. Nexium 40 mg p.o. daily.  6. Flexeril 10 mg p.o. as needed.  7. Zolpidem 10 mg p.o. at bedtime. 8. Tikosyn 250 mg p.o. twice daily.  9. Oxybutynin 10 mg p.o. daily.   SOCIAL HISTORY: The patient continues to smoke 4 or 5 cigarettes a day. No drug or alcohol abuse. He lives in [place] and lives alone.   FAMILY HISTORY: Heart disease in both the patient's parents, as well as diabetes, as well as cancer. The patient's mother had anemia.   REVIEW OF SYSTEMS: Positive for fatigue and weakness, headaches, shoulder aches, occipital area pains, some blurring of vision, some snoring as well as cough and wheezes and dyspnea, especially in the mornings. He uses a few pillows, 2 to 4 pillows at nighttime. Some chest pains earlier today, pressure type. Also, he had swelling in his upper extremities bilaterally and mostly in the armpits. Episodes of syncope, dyspnea on exertion, also abdominal pains in the lower abdomen as well as diarrhea for the past one week. CONSTITUTIONAL: Denies any fevers, weight loss or gain. EYES: In regards to eyes, denies any double vision, glaucoma, cataracts. ENT: Denies any tinnitus, allergies, epistaxis sinus pain, dentures, difficulty swallowing. RESPIRATORY: Denies any hemoptysis, painful respirations. CARDIOVASCULAR: Denies orthopnea, edema, arrhythmias, palpitations. GASTROINTESTINAL: Denies any nausea, vomiting, hematemesis, rectal bleeding or change in bowel habits. GENITOURINARY: Denies dysuria, hematuria, frequency, or incontinence. ENDOCRINOLOGY: Denies any polydipsia, nocturia, thyroid problems, heat or cold intolerance or thirst. HEMATOLOGIC: Denies anemia, easy bruising, bleeding, or swollen glands. SKIN: Denies any acne, rashes,  lesions, or change in moles. MUSCULOSKELETAL: Denies arthritis, cramps, swelling, or gout. NEUROLOGICAL: Denies numbness, epilepsy, or tremor. PSYCHIATRIC: Denies anxiety, insomnia, or depression.    PHYSICAL EXAMINATION:  VITAL SIGNS: On arrival to the hospital, temperature was 98, pulse 77, respiration rate 22, blood pressure 101/53, saturation 100 percent on room air.   GENERAL: This is a well-developed, well-nourished African American male in no significant distress lying on the stretcher.   HEENT: His pupils are equal, reactive to light. Extraocular movements are intact.  No icterus or conjunctivitis. He has normal hearing. No pharyngeal erythema. Mucosa is moist.   NECK: Neck did not reveal any masses, supple, nontender. Thyroid is not enlarged.  No adenopathy, no JVD or carotid bruits bilaterally. Full range of motion.   LUNGS: Clear to auscultation in all fields. No rales. A few rhonchi were heard as well as a few wheezes. No labored inspirations, increased effort, dullness to percussion. or overt respiratory distress.   CARDIOVASCULAR: S1, S2 appreciated. No murmurs, gallops or rubs. PMI is not lateralized.   CHEST:  The patient's chest was somewhat mildly tender to palpation.   EXTREMITIES: Good pedal pulses. No lower extremity edema, calf tenderness or cyanosis noted.   ABDOMEN: Soft, nontender, bowel sounds are present. No hepatosplenomegaly or masses are noted. The patient does have some discomfort in the suprapubic area but no pain or rebound or guarding.   RECTAL: Deferred.   MUSCULOSKELETAL: Muscle strength: The patient is able to manipulate his upper and lower extremities, 5 out of 5.  No cyanosis, degenerative joint disease, or kyphosis. Gait is not tested.   SKIN: Skin did not reveal any rashes, lesions, erythema, nodularity, or induration. The patient does have some mild small ulcerated plaque of approximately 5 mm in diameter on the right anterior tibial area. No other  areas of nodularity or induration. Skin was warm and dry to palpation.   LYMPH: No adenopathy in the cervical region.   NEUROLOGICAL: Cranial nerves are grossly intact. Sensory is intact. No dysarthria or aphasia. The patient is alert, oriented to time, person, place, cooperative. Memory is good.   PSYCHIATRIC: No significant confusion, agitation, or depression noted.   LABORATORY, DIAGNOSTIC AND RADIOLOGICAL DATA:  Glucose 100, potassium 3.4, otherwise BMP within normal limits.  Alcohol level less than 0.003%. Liver enzymes: AST is slightly elevated at 38, otherwise unremarkable.  The patient's troponin level is less than 0.02.  Urine toxicology screen was negative.  White blood cell count is elevated to 10.9, hemoglobin 11.0, platelets 228. MCV is low at 64.0. Coagulation panel: Pro time 26.3, INR 2.4.  Urinalysis: Straw clear urine, negative for glucose, bilirubin or ketones, specific gravity 1.004, pH 6.0, negative for blood, protein, nitrites, or leukocyte esterase, 1 white blood cell as well as 1 red blood cell. No bacteria or epithelial cells were noted. Mucus was present.  EKG: Normal sinus rhythm at a rate 73 beats per minute, normal axis. No acute ST-T changes were noted.  Chest, one view 04/27/2011: No evidence of congestive heart failure or pneumonia. No other acute cardiopulmonary disease.  CT of head without contrast showed no evidence of acute ischemic or hemorrhagic event. There are white matter density changes which are stable consistent with chronic small vessel ischemia, according to the radiologist.  Subtle stable hypertensive left thalamic nucleus and adjacent left basal ganglia, may reflect old subcentimeter lacunar infarctions.   ASSESSMENT AND PLAN:  1. Syncope: Admit the patient to Telemetry. Follow his telemetry. We will check cardiac enzymes x3. We will continue the patient on nitroglycerin, if the patient's blood pressure tolerates, as well as beta blockers,  metoprolol. I will ask the cardiologist to see the patient. No echocardiogram or carotid ultrasound will be done as the patient had those recently in February 2013, and they were normal. We will also get orthostatic vital signs.  2. Chest pain: We will ask the cardiologist, as mentioned above, to see the patient. We will continue the patient on beta blockers as well as low-dose aspirin and nitroglycerin as mentioned above.  3. Low abdominal pain with diarrhea: We will get stool cultures. We will consider CT of abdomen and pelvis, and we will continue soft diet. We will get colonoscopy results from [facility].  4. History of atrial fibrillation: In sinus rhythm now. Continue Tikosyn.  5. Tobacco abuse: Nicotine replacement therapy will be initiated. That was discussed with the patient for five minutes. He agrees to taper smoking and possibly even stop it.   TIME SPENT:   One hour.  ____________________________ Katharina Caper, MD rv:cbb D: 04/27/2011 08:11:53 ET T: 04/27/2011 10:52:25 ET JOB#: [id number] cc: Corky Downs, MD Yamilex Borgwardt MD ELECTRONICALLY SIGNED 04/27/2011 13:43

## 2014-05-28 NOTE — Consult Note (Signed)
PATIENT NAME:  [name], [name] MR#:  [id number] DATE OF BIRTH:  02/27/1955  DATE OF CONSULTATION:  04/27/2011  REFERRING PHYSICIAN:   CONSULTING PHYSICIAN:  Corky DownsJaved Larisa Lanius, MD  HISTORY OF PRESENT ILLNESS: [name] is a 59 year old gentleman who was admitted into the hospital with a near blackout spell. He also complained of chest pressure, pressure in the back of the head, dizziness, feeling tired, fatigued. The patient had a defibrillator placed for cardiomyopathy. He has had multiple admissions to this hospital. He also has been followed by many cardiologists locally as well as at [facility]. He has a history of TIA, CVA, seizure disorder, and coronary artery disease. Defibrillator is functioning well. He has frequent history of blacking out spells.   PAST MEDICAL HISTORY: 1. Heart attack in the past.  2. History of pulmonary embolus and atrial fibrillation which is controlled on Tikosyn and in sinus rhythm now.  3. History of deep venous thrombosis. The patient has been on long-term anti-coagulation.  4. Hypertension with hypertensive cardiovascular disease.  5. History of fibromyalgia.  6. Cervical spine disease with radiculopathy. 7. Left subclavian stenosis.  8. Anemia.  9. Chronic obstructive pulmonary disease.  10. Drug seeking behavior and has had multiple admissions in this hospital. He has had multiple venograms. 11. History of thrombophlebitis of the left arm, subclavian stenosis, subclavian venous stenosis. Cardiac cath was done in February of 2012 which showed 50% LAD, 50% diagonal, and 25% circumflex. 12. History of CVA with left-sided weakness in 2009. The patient had a hypercoagulation work-up done in the past. 13. Neuropathy.  14. Dyslipidemia.  15. Ongoing tobacco abuse.     PHYSICAL EXAMINATION:   GENERAL: The patient is alert and cooperative in no acute distress. Telemetry did not show any arrhythmia.   HEENT: Unremarkable.   NECK: Supple. Jugular  venous pressure is not elevated. Carotid upstroke is 2+. The patient has a defibrillator placed in the left upper chest.   CHEST: Decreased breath sounds.   ABDOMEN: Soft, nontender without any hepatosplenomegaly.   EXTREMITIES: There is no pedal edema. No calf tenderness.   Electrocardiogram does not show any acute changes.   IMPRESSION: Near syncope. Will keep the patient on telemetry to rule out any arrhythmia. Also, do CPK isoenzymes and troponin to rule out myocardial infarction. The patient's condition is stable on the previous regime. He complained of nonspecific pain in the abdomen and back of the neck. He is requesting morphine which was politely declined. Will continue Tikosyn for atrial fibrillation. I do not see any need to do any additional invasive study. His cardiac cath has been done recently and we do not have any evidence to do any work-up at the present admission. He is also suggested to get a [facility] to see what they suggest. His neck pain is due to arthritis of the cervical spine.  ____________________________ Corky DownsJaved Abigal Choung, MD jm:drc D: 04/27/2011 12:55:37 ET T: 04/27/2011 14:40:09 ET JOB#: [id number]  cc: Corky DownsJaved Calvary Difranco, MD, <Dictator> Corky DownsJAVED Rumaysa Sabatino MD ELECTRONICALLY SIGNED 04/27/2011 19:58

## 2014-05-28 NOTE — Discharge Summary (Signed)
PATIENT NAME:  [name], [name] MR#:  [id number] DATE OF BIRTH:  [birth year]  DATE OF ADMISSION:  03/24/2011 DATE OF DISCHARGE:  03/26/2011  HISTORY OF PRESENT ILLNESS: [name] is a 59 year old gentleman who was admitted to the hospital with chest pain and left upper extremity pain. The patient is known to have coronary artery disease, deep venous thrombosis, bilateral pulmonary emboli, cerebrovascular accident, hypertension, and status post implantable cardiac defibrillator who presented with a complaint of facial numbness at supper this evening. He reported chest pain in the left side so he was admitted to the hospital. For the rest of the details of the history and physical, please see typed History and Physical sheet.   PAST MEDICAL HISTORY:  1. Bilateral pulmonary emboli. 2. Fibromyalgia.  3. Hypertension.  4. Atrial fibrillation. [facility].   ACCESSORY CLINICAL DATA:  Blood sugar 91. Repeat blood sugar 102, calcium 8.2, repeat calcium 8.4. GFR more than 60. CPK 363, 287, 259. Troponin negative. Hematocrit 26.8. Repeat hematocrit 33.4. Because of low hematocrit the patient was transfused two units of packed cells of blood. CT scan did not reveal any evidence of change in the cervical spine. C5 and C6 were stable. A CT of the head revealed small vessel disease. No focal abnormalities were noted.   The patient was admitted to the hospital. Serial CPK isoenzymes were negative. The patient was discharged home on his previous medications which include:  MEDICATIONS: 1. Advair. 2. Albuterol inhaler 90 mcg with adapter 4 times a day.  3. Metoprolol 50-mg tablet extended release p.o. daily.  4. Nexium 40 mg p.o. daily.  5. Flexeril 10 mg p.o. daily.  6. Zolpidem 10 mg oral tablet p.o. daily.  7. Tikosyn 250-mg capsule daily.  8. Oxybutynin 10 mg p.o. daily.   FINAL DIAGNOSES:  1. Possible transient ischemic episode. 2. Cerebrovascular accident. 3. Cervical spine disease  with radiculopathy. 4. History of left subclavian stenosis. 5. History of bilateral pulmonary emboli.  6. Anemia.  7. Chronic obstructive pulmonary disease.  8. Obstructive sleep apnea.  9. Coronary artery disease, status post automatic implantable cardiac defibrillator. 10. Multiple admissions to [facility], [place], and this hospital for recurrent chest pain and symptoms consistent with transient ischemic attack. 11. Fibromyalgia. 12. Gastrointestinal bleeding with hematochezia. The patient had [facility]. 13. Acute on chronic anemia, status post transfusion of 2 units of blood.  FOLLOWUP: Follow up at [facility] and follow up [facility].    ____________________________ Corky DownsJaved Maico Mulvehill, MD jm:bjt D: 04/07/2011 22:17:07 ET T: 04/08/2011 14:46:04 ET JOB#: [id number]  cc: Corky DownsJaved Maritssa Haughton, MD, <Dictator> Corky DownsJAVED Vilas Edgerly MD ELECTRONICALLY SIGNED 04/13/2011 12:17

## 2014-05-28 NOTE — Consult Note (Signed)
PATIENT NAME:  [name], [name] MR#:  [id number] DATE OF BIRTH:  1955-12-11  DATE OF CONSULTATION:  03/24/2011  REFERRING PHYSICIAN:  Alounthith Phichith, MD CONSULTING PHYSICIAN:  Rose Phi. Kemper Durie, MD  HISTORY: Mr. Vonbargen is a 59 year old left-handed African American patient of Dr. Corky Downs with history of tobacco use, chronic obstructive pulmonary disease, hypertension, hyperlipidemia, moderate obesity, suspected obstructive sleep apnea, coronary artery disease, paroxysmal atrial fibrillation, implanted automatic cardiac defibrillator first placed 10/11/2001 at [facility] and replaced for a second and third time at [facility] in 2009 and 2010, ablation procedure February 2013 at [facility] for cardiac dysrhythmia, reported hospitalization at [facility] for resolved stroke affecting his left side, and chronic Coumadin anticoagulation and chronic left upper extremity pain and paresthetic numbness, in the setting of 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 He was admitted early 03/24/2011 with complaints of facial numbness, left side numbness and weakness, and chest pain and chest numbness. He is referred for evaluation of suspected stroke. History comes from the patient, his hospital chart, and from his hospital records.   The patient came to the emergency room at 11:10 p.m. on 03/23/2011 with regard concerning possible stroke. He reports being in his usual state of health until the evening of 03/23/2011. He had been with a friend between 8 and 8:30 p.m., and ate pigs feet.  At approximately 9:30 p.m., as he walked back to the living area from a trip to the bathroom to urinate, he "got completely numb with a zillion pins" of his face, left more than right anterior chest, and of the left arm and leg with which he reports he experienced some shortness of breath and a feeling of tightness of the left side of the face and neck and shoulder area. He reports that his  symptoms decreased in less than five minutes of sitting down, they did not fully clear. He deferred his friend's offer to call 911 as he was better.  He reports that he had a second similar spell, less intense and lasting less than a minute, after which his symptoms began to be decreased, but did not clear so that he went to the emergency room. He reports that about 20 minutes after arriving to the emergency room, he had the onset of left side posterior headache and neck pain. When examined in the afternoon on 03/24/2011, he reported that he still had some mild left posterior headache and neck discomfort and that he still had a little numbness or feeling of tightness on the left side of the face and some numbness of the left upper extremity. He reports that he has had significant tightness of the left side of his neck since his first intracardiac device placed in 2003. Brain CT scan in the emergency room was benign.   PHYSICAL EXAMINATION: The patient is a well-developed, moderately overweight African American gentleman who was pleasant and cooperative and in no apparent distress, examined lying semisupine, normocephalic without evidence of trauma and with neck supple. He had significant decrease of range of motion with rotating his head to the left side and reported left posterior lateral neck aching discomfort with turning the head toward the left and with bending the head to right and left, more discomfort of left posterior lateral neck with bending it to the right than to his left. Mental status was normal, although cognitive testing was not performed in detail. He was alert and fully oriented with clear speech and normal expression  and was lucid and a good historian with normal affect. Cranial nerve examination was symmetric and normal including eye movements and visual fields to finger count for each eye; visual acuity was not tested. Motor examination of the extremities showed normal tone throughout and  normal bulk. There was full power proximally and distally in the right arm and leg. There was early give way testing in the muscles proximally and distally in the left arm and leg with variable elicitable power. Coordination examination was notable for relative slowing of movements of the left side without dystaxia. Reflexes were symmetric and rated 2+ in the upper extremities and 2 to 3 at the knees and ankles. His gait was not tested.   IMPRESSION: I suspect that his symptoms beginning last evening are associated with tightness of muscles of the left posterolateral neck including mild radiculitis involving the arm. I cannot entirely rule out the possibility that he has had a small stroke or transient ischemic attack.   RECOMMENDATIONS:  1. He will be referred to physical therapy for evaluation and treatment of neck area muscle tightness and pain with request that he be educated in a Treat Your Own Neck program and instructed in daily exercises to stretch and relax muscles.  2. Bolster cervical pillow.  3. He will be scheduled for CT scan of the cervical spine to evaluate arthritic changes.            4. Flexeril 5 mg three times a day with meals in addition to his regular 10 mg at night, and for the next two weeks. He is advised then to decrease his dose to 5 mg at supper along with 10 mg at night.   I appreciate being asked to see this pleasant and interesting gentleman.  ____________________________ Rose PhiPeter R. Kemper Durielarke, MD prc:slb D: 03/24/2011 17:13:12 ET T: 03/24/2011 17:43:41 ET JOB#: [id number]  cc: Rose PhiPeter R. Kemper Durielarke, MD, <Dictator> Gaspar GarbePETER R Yanique Mulvihill MD ELECTRONICALLY SIGNED 03/26/2011 9:03

## 2014-05-28 NOTE — Discharge Summary (Signed)
PATIENT NAME:  [name], [name] MR#:  [id number] DATE OF BIRTH:  1955/06/20  DATE OF ADMISSION:  04/27/2011 DATE OF DISCHARGE:  04/29/2011  HISTORY OF PRESENT ILLNESS:   [name] is a 59 year old male with a complicated medical history of transient ischemic attack, cerebrovascular accident, seizure disorder, coronary artery disease status post defibrillator and pacemaker placement for third degree heart block, who was admitted into the hospital complaining as if he felt weakness was coming on. He was short of breath and dizzy. He was presyncopal. He started having mild chest pain, brought to the Emergency Room and admitted into the hospital. For the rest of the details of the history and physical, please see typed history and physical sheet.   PAST MEDICAL HISTORY:    1. History of cerebrovascular accident.  2. Seizure. 3. Coronary artery disease.  4. Pulmonary emboli.  5. Atrial fibrillation. 6. Hypertension. 7. Gastrointestinal bleed.  8. Recent admission to the hospital with a questionable transient ischemic attack.  9. History of hematochezia on February 2013. 10. He has multiple admissions in this hospital and [facility].  11. Cervical spine disease with radiculopathy.  12. Fibromyalgia.  13. Left subclavian stenosis.  14. Recurrent central venous stenosis. 15. Left subclavian venous stenosis. Cardiac catheterization done in February 2012 revealed 50% LAD, 50% first diagonal and 20% left circumflex stenosis, left-sided weakness in 2009. 16. Neuropathy. 17. Hyperlipidemia.  18. The patient is status post ablation in February 2013 with automatic implantable defibrillator. 19. Bilateral groin hernia repair TUR in 2010. 20. Anal fissure surgery.   ALLERGIES: The patient is allergic to Celebrex, Cipro, Dilaudid, nitroglycerin, tramadol, hydrochlorothiazide, Zetia and Pradaxa.   HOSPITAL COURSE: The patient was admitted into the hospital. He was continued on his previous medication  by the hospitalist as described by the patient and was monitored for arrhythmia. He continued to complain of nonspecific pain and wanted pain medication for that. Also, for nonspecific fibromyalgia pain, he wanted morphine. I had a detailed discussion with the patient and wanted to have a psych substance abuse consultation as well as pain consultation. Neurology consultation was offered to the patient. The patient was demanding more and more pain medication which has been well documented in the nurses' notes. I was reluctant to give him morphine because of undocumented pain. Finally, the patient decided that he wants to go home. He was discharged home on the following medications.  DISCHARGE MEDICATIONS: 1. Nicorette 4 mg oral transmucosal gum p.r.n.  2. Albuterol 2 puffs 4 times a day. 3. Flexeril 10 mg 2 tablets once a day. 4. Zolpidem 10 mg 1 tablet p.o. daily.  5. Ferrous sulfate 325 mg 1 tablet once a day. 6. Acetaminophen/oxycodone 325/5 mg oral tablet, two tablets orally q.4h. as needed for pain. No prescription was given for the pain medication or any other medication. The patient says that he has all the medicine at home.  7. Coumadin 7.5 mg p.o. daily.  8. Metoprolol succinate ER 100 mg 1 tablet once a day. 9. Nexium 40 mg delayed release 1 capsule twice a day. 10. Oxybutynin 10 mg 1 tablet once a day.  11. Benadryl 50 mg q.6 hours as needed for itching. 12. Zofran 4 mg 1 tablet every eight hours as needed for nausea.   Before discharge the patient complained of pain in the jaw and also complained of pain in the sinus area and x-rays were obtained which were negative. He continued to work on his laptop all day. He complained  of some eye problems so an appointment was called in to the eye doctor to see him but because the office was closed he said that he was going to see an eye doctor tomorrow. The patient did not have any change in the vital signs when he walked in the hallway.   ____________________________ Corky DownsJaved Kyndal Heringer, MD jm:ap D: 04/29/2011 20:32:48 ET         T: 04/30/2011 10:35:27 ET                  JOB#: [id number] cc: Corky DownsJaved Rh[place], MD, <Dictator> Corky DownsJAVED Keltie Labell MD ELECTRONICALLY SIGNED 04/30/2011 13:38

## 2015-03-15 DIAGNOSIS — E119 Type 2 diabetes mellitus without complications: Secondary | ICD-10-CM

## 2015-03-15 DIAGNOSIS — E1165 Type 2 diabetes mellitus with hyperglycemia: Secondary | ICD-10-CM

## 2015-07-25 ENCOUNTER — Encounter: Payer: Self-pay | Admitting: Emergency Medicine

## 2015-07-25 ENCOUNTER — Emergency Department: Payer: Medicare Other

## 2015-07-25 ENCOUNTER — Emergency Department
Admission: EM | Admit: 2015-07-25 | Discharge: 2015-07-25 | Disposition: A | Discharge status: Home / Self Care | Payer: Medicare Other | Attending: Emergency Medicine | Admitting: Emergency Medicine

## 2015-07-25 DIAGNOSIS — Y999 Unspecified external cause status: Secondary | ICD-10-CM | POA: Insufficient documentation

## 2015-07-25 DIAGNOSIS — Y9389 Activity, other specified: Secondary | ICD-10-CM | POA: Diagnosis not present

## 2015-07-25 DIAGNOSIS — Y9289 Other specified places as the place of occurrence of the external cause: Secondary | ICD-10-CM | POA: Insufficient documentation

## 2015-07-25 DIAGNOSIS — X58XXXA Exposure to other specified factors, initial encounter: Secondary | ICD-10-CM | POA: Diagnosis not present

## 2015-07-25 DIAGNOSIS — Z87891 Personal history of nicotine dependence: Secondary | ICD-10-CM | POA: Insufficient documentation

## 2015-07-25 DIAGNOSIS — I4891 Unspecified atrial fibrillation: Secondary | ICD-10-CM | POA: Insufficient documentation

## 2015-07-25 DIAGNOSIS — M79661 Pain in right lower leg: Secondary | ICD-10-CM | POA: Diagnosis present

## 2015-07-25 DIAGNOSIS — Z950 Presence of cardiac pacemaker: Secondary | ICD-10-CM | POA: Insufficient documentation

## 2015-07-25 DIAGNOSIS — S86011A Strain of right Achilles tendon, initial encounter: Secondary | ICD-10-CM | POA: Diagnosis not present

## 2015-07-25 HISTORY — DX: Activated protein C resistance: D68.51

## 2015-07-25 HISTORY — DX: Presence of cardiac pacemaker: Z95.0

## 2015-07-25 HISTORY — DX: Unspecified atrial fibrillation: I48.91

## 2015-07-25 MED ORDER — OXYCODONE-ACETAMINOPHEN 5-325 MG PO TABS
ORAL_TABLET | ORAL | Status: AC
  Administered 2015-07-25: 2 via ORAL
  Filled 2015-07-25: qty 2

## 2015-07-25 MED ORDER — OXYCODONE-ACETAMINOPHEN 5-325 MG PO TABS
2.0000 | ORAL_TABLET | Freq: Once | ORAL | Status: AC
Start: 2015-07-25 — End: 2015-07-25
  Administered 2015-07-25: 2 via ORAL

## 2015-07-25 NOTE — Discharge Instructions (Signed)
Complete Achilles Tendon Rupture  An Achilles tendon rupture is an injury in which the tough, cord-like band that attaches the lower muscles of your leg to your heel (Achilles tendon) tears (ruptures). In a complete Achilles tendon rupture, you are not able to stand up on the toes of the injured leg.  CAUSES  A tendon may rupture if it is weakened or weakening (degenerative) and a sudden stress is applied to it. Weakening or degeneration of a tendon may be caused by:  · Recurrent injuries, such as those causing Achilles tendinitis.  · Damaged tendons.  · Aging.  · Vascular disease of the tendon.  SIGNS AND SYMPTOMS  · Feeling as if you were struck violently in the back of the ankle.  · Hearing a "pop" and experiencing severe, sudden (acute) pain; however, the absence of pain does not mean there was not a rupture.  DIAGNOSIS  A physical exam is usually all that is needed to diagnose an Achilles tendon rupture. During the exam, your health care provider will touch the tendon and the structures around it. You may be asked to lie on your stomach or kneel on a chair while your health care provider squeezes your calf muscle. You most likely have a ruptured tendon if your foot does not flex.   Sometimes tests are performed. These may include:  · An ultrasound. This allows quick confirmation of the diagnosis.  · An X-ray.  · An MRI.  TREATMENT   A complete Achilles tendon rupture is treated with surgery. You will have a cast while the injury heals (this usually takes 6-10 weeks). Before your surgery, treatment may consist of:  · Ice applied to the area.  · Pain-relieving medicines.  · Rest.  · Crutches.  · Keeping the ankle from moving (immobilization), usually with a splint.  HOME CARE INSTRUCTIONS   · Apply ice to the injured area:      Put ice in a plastic bag.      Place a towel between your skin and the bag.      Leave the ice on for 20 minutes, 2-3 times a day.    · Use crutches and move about only as instructed.     · Keep the leg elevated above the level of the heart (the center of the chest) at all times when not using the bathroom. Do not dangle the leg over a chair, couch, or bed. When lying down, elevate your leg on a few pillows. Elevation prevents swelling and reduces pain.  · Avoid use of the injured area other than gentle range of motion of the toes.  · Do not drive a car until your health care provider specifically tells you it is safe to do so.  · Take all medicines as directed by your health care provider.  · Keep all follow-up visits as directed by your health care provider.  SEEK MEDICAL CARE IF:   · Your pain and swelling increase, or your pain is not controlled by medicines.    · You have new, unexplained symptoms, or your symptoms get worse.  · You cannot move your toes or foot.  · You develop warmth and swelling in your foot.  · You have an unexplained fever.  MAKE SURE YOU:   · Understand these instructions.  · Will watch your condition.  · Will get help right away if you are not doing well or get worse.     This information is not intended to replace advice given to you by   your health care provider. Make sure you discuss any questions you have with your health care provider.     Document Released: 10/30/2004 Document Revised: 06/06/2014 Document Reviewed: 09/10/2012  Elsevier Interactive Patient Education ©2016 [employer]

## 2015-07-25 NOTE — ED Provider Notes (Signed)
[facility] Emergency Department Provider Note  ____________________________________________  Time seen: 6:50 PM  I have reviewed the triage vital signs and the nursing notes.   HISTORY  Chief Complaint Foot Pain    HPI [name] is a 60 y.o. male who complains of sudden onset of right mid calf pain that started this afternoon at about 3 PM. He was pushing a car at the time, with one hand on the steering wheel leaning into the door while pushing with his feet outside when he had the sudden onset of pain. He accepted a puddle at the time and was worried that he might of been electrocuted. There are no overhead wires or other loose equipment. He does have a pacemaker defibrillator implanted, denies any chest pain shortness of breath or palpitations. No dizziness syncope. Does not feel like he had received a shock from that. No other pains anywhere. Hurts to walk on the affected foot.     Past Medical History  Diagnosis Date  . Pacemaker   . Atrial fibrillation (HCC)   . Factor 5 Leiden mutation, heterozygous (HCC)      There are no active problems to display for this patient.    No past surgical history on file. Pacemaker insertion  No current outpatient prescriptions on file. Percocet 10  Allergies Apixaban; Celecoxib; Ciprofloxacin; Dabigatran etexilate mesilate; Dilaudid; Nitroglycerin; Prednisone; and Tramadol   No family history on file.  Social History Social History  Substance Use Topics  . Smoking status: Former Games developermoker  . Smokeless tobacco: None  . Alcohol Use: No    Review of Systems  Constitutional:   No fever or chills.  Cardiovascular:   No chest pain. Respiratory:   No dyspnea or cough. Gastrointestinal:   Negative for abdominal pain, vomiting and diarrhea.  Musculoskeletal:   Severe right calf pain Neurological:   Negative for headaches 10-point ROS otherwise  negative.  ____________________________________________   PHYSICAL EXAM:  VITAL SIGNS: ED Triage Vitals  Enc Vitals Group     BP 07/25/15 1836 139/86 mmHg     Pulse Rate 07/25/15 1836 99     Resp 07/25/15 1836 18     Temp 07/25/15 1836 98.4 F (36.9 C)     Temp Source 07/25/15 1836 Oral     SpO2 07/25/15 1836 95 %     Weight 07/25/15 1836 205 lb (92.987 kg)     Height 07/25/15 1836 5\' 5"  (1.651 m)     Head Cir --      Peak Flow --      Pain Score 07/25/15 1831 10     Pain Loc --      Pain Edu? --      Excl. in GC? --     Vital signs reviewed, nursing assessments reviewed.   Constitutional:   Alert and oriented. Well appearing and in no distress. Eyes:   No scleral icterus. No conjunctival pallor. PERRL. EOMI.  No nystagmus. ENT   Head:   Normocephalic and atraumatic.      Neck:   Full range of motion, no tenderness.. Hematological/Lymphatic/Immunilogical:   No cervical lymphadenopathy. Cardiovascular:   RRR. Symmetric bilateral radial and DP pulses.  No murmurs. Pacemaker site nontender, noninflamed Respiratory:   Normal respiratory effort without tachypnea nor retractions. Breath sounds are clear and equal bilaterally. No wheezes/rales/rhonchi. Gastrointestinal:   Soft and nontender. Non distended. There is no CVA tenderness.  No rebound, rigidity, or guarding. Musculoskeletal:   Severe tenderness with palpation  of the right calf and along the course of the Achilles tendon. There is an emptiness just above the Achilles tendon insertion over the right calcaneus. Thompson test is positive for rupture. I performed a bedside ultrasound examination of the bilateral Achilles tendons with a linear probe. The unaffected left Achilles tendon has smooth fibers visualized from the insertion up to the belly of the gastrocnemius. The affected right side has smooth fibers visualized at the insertion on the calcaneus. However, moving superiorly about 3 or 4 cm reveals an area of  obvious disruption, with a distance of about 2-3 cm of distraction from the proximal and distal tendon fragments. Neurologic:   Normal speech and language.  CN 2-10 normal. Relative weakness of right foot plantar flexion. No gross focal neurologic deficits are appreciated.  Skin:    Skin is warm, dry and intact. No rash noted.  No petechiae, purpura, or bullae. No apparent burn marks or other superficial wounds.  ____________________________________________    LABS (pertinent positives/negatives) (all labs ordered are listed, but only abnormal results are displayed) Labs Reviewed - No data to display ____________________________________________   EKG  Interpreted by me  Date: 07/25/2015  Rate: 87  Rhythm: normal sinus rhythm  QRS Axis: normal  Intervals: normal  ST/T Wave abnormalities: normal  Conduction Disutrbances: none  Narrative Interpretation: unremarkable      ____________________________________________    RADIOLOGY  X-ray right tib-fib unremarkable X-ray right ankle unremarkable  ____________________________________________   PROCEDURES SPLINT APPLICATION Date/Time: 8:45 PM Authorized by: Sharman Cheek Consent: Verbal consent obtained. Risks and benefits: risks, benefits and alternatives were discussed Consent given by: patient Splint applied by: Me and orthopedic technician Location details: Right leg  Splint type: Posterior with plantar flexion  Supplies used: Ortho-Glass  Post-procedure: The splinted body part was neurovascularly unchanged following the procedure. Patient tolerance: Patient tolerated the procedure well with no immediate complications.     ____________________________________________   INITIAL IMPRESSION / ASSESSMENT AND PLAN / ED COURSE  Pertinent labs & imaging results that were available during my care of the patient were reviewed by me and considered in my medical decision making (see chart for  details).  Patient presents with sudden onset severe right leg pain. Clinical exam is consistent with Achilles tendon rupture. No evidence of fracture or dislocation on x-ray. Bedside ultrasound exam performed by me also confirms Achilles tendon rupture. Discussed with Dr. Ernest Pine of orthopedics for advice on best immobilization until follow-up can be obtained, advises posterior splint with plantar flexion. We'll apply and give crutches and instructed patient he needs to follow up tomorrow in clinic with Dr. Hyacinth Meeker who is on-call for tonight.     ____________________________________________   FINAL CLINICAL IMPRESSION(S) / ED DIAGNOSES  Final diagnoses:  Achilles tendon rupture, right, initial encounter       Portions of this note were generated with dragon dictation software. Dictation errors may occur despite best attempts at proofreading.   Sharman Cheek, MD 07/25/15 2046

## 2015-07-25 NOTE — ED Notes (Addendum)
Patient presents to the ED with right foot pain.  Patient states, "I think I stepped on an area that was electrified."  Patient reports right foot pain and numbness.  Patient denies any other pain.  Patient denies shortness of breath.  Patient has a pacemaker.

## 2015-09-23 DIAGNOSIS — G8929 Other chronic pain: Secondary | ICD-10-CM | POA: Insufficient documentation

## 2016-01-23 ENCOUNTER — Emergency Department: Payer: Medicare Other

## 2016-01-23 ENCOUNTER — Emergency Department
Admission: EM | Admit: 2016-01-23 | Discharge: 2016-01-23 | Disposition: A | Discharge status: Home / Self Care | Payer: Medicare Other | Attending: Emergency Medicine | Admitting: Emergency Medicine

## 2016-01-23 ENCOUNTER — Encounter: Payer: Self-pay | Admitting: Occupational Medicine

## 2016-01-23 DIAGNOSIS — Z87891 Personal history of nicotine dependence: Secondary | ICD-10-CM | POA: Diagnosis not present

## 2016-01-23 DIAGNOSIS — I509 Heart failure, unspecified: Secondary | ICD-10-CM | POA: Insufficient documentation

## 2016-01-23 DIAGNOSIS — E119 Type 2 diabetes mellitus without complications: Secondary | ICD-10-CM | POA: Diagnosis not present

## 2016-01-23 DIAGNOSIS — R079 Chest pain, unspecified: Secondary | ICD-10-CM | POA: Insufficient documentation

## 2016-01-23 DIAGNOSIS — I110 Hypertensive heart disease with heart failure: Secondary | ICD-10-CM | POA: Insufficient documentation

## 2016-01-23 DIAGNOSIS — Z950 Presence of cardiac pacemaker: Secondary | ICD-10-CM | POA: Insufficient documentation

## 2016-01-23 DIAGNOSIS — R002 Palpitations: Secondary | ICD-10-CM | POA: Diagnosis not present

## 2016-01-23 HISTORY — DX: Essential (primary) hypertension: I10

## 2016-01-23 HISTORY — DX: Heart failure, unspecified: I50.9

## 2016-01-23 HISTORY — DX: Type 2 diabetes mellitus without complications: E11.9

## 2016-01-23 LAB — TROPONIN I
Troponin I: <0.03 ng/mL (ref <0.03)
Troponin I: <0.03 ng/mL (ref <0.03)

## 2016-01-23 LAB — CBC
HCT: 33.7 % — ABNORMAL LOW (ref 40.0–52.0)
Hemoglobin: 10.4 g/dL — ABNORMAL LOW (ref 13.0–18.0)
MCH: 18 pg — ABNORMAL LOW (ref 26.0–34.0)
MCHC: 30.8 g/dL — ABNORMAL LOW (ref 32.0–36.0)
MCV: 58.5 fL — ABNORMAL LOW (ref 80.0–100.0)
Platelets: 340 10*3/uL (ref 150–440)
RBC: 5.76 MIL/uL (ref 4.40–5.90)
RDW: 20.7 % — ABNORMAL HIGH (ref 11.5–14.5)
WBC: 10.4 10*3/uL (ref 3.8–10.6)

## 2016-01-23 LAB — COMPREHENSIVE METABOLIC PANEL
ALT: 16 U/L — ABNORMAL LOW (ref 17–63)
AST: 30 U/L (ref 15–41)
Albumin: 3.8 g/dL (ref 3.5–5.0)
Alkaline Phosphatase: 67 U/L (ref 38–126)
Anion gap: 9 (ref 5–15)
BUN: 13 mg/dL (ref 6–20)
CO2: 26 mmol/L (ref 22–32)
Calcium: 9.1 mg/dL (ref 8.9–10.3)
Chloride: 100 mmol/L — ABNORMAL LOW (ref 101–111)
Creatinine, Ser: 1.13 mg/dL (ref 0.61–1.24)
GFR calc Af Amer: >60 mL/min (ref >60)
GFR calc non Af Amer: >60 mL/min (ref >60)
Glucose, Bld: 124 mg/dL — ABNORMAL HIGH (ref 65–99)
Potassium: 2.8 mmol/L — ABNORMAL LOW (ref 3.5–5.1)
Sodium: 135 mmol/L (ref 135–145)
Total Bilirubin: 0.7 mg/dL (ref 0.3–1.2)
Total Protein: 7.3 g/dL (ref 6.5–8.1)

## 2016-01-23 LAB — FIBRIN DERIVATIVES D-DIMER (ARMC ONLY): Fibrin derivatives D-dimer (ARMC): 2504 — ABNORMAL HIGH (ref 0–499)

## 2016-01-23 LAB — PROTIME-INR
INR: 3.88
Prothrombin Time: 39 seconds — ABNORMAL HIGH (ref 11.4–15.2)

## 2016-01-23 MED ORDER — IOPAMIDOL (ISOVUE-370) INJECTION 76%
75.0000 mL | Freq: Once | INTRAVENOUS | Status: AC | PRN
  Administered 2016-01-23: 75 mL via INTRAVENOUS

## 2016-01-23 MED ORDER — ASPIRIN 81 MG PO CHEW
324.0000 mg | CHEWABLE_TABLET | Freq: Once | ORAL | Status: AC
  Administered 2016-01-23: 324 mg via ORAL
  Filled 2016-01-23: qty 4

## 2016-01-23 MED ORDER — POTASSIUM CHLORIDE CRYS ER 20 MEQ PO TBCR
40.0000 meq | EXTENDED_RELEASE_TABLET | Freq: Once | ORAL | Status: AC
Start: 2016-01-23 — End: 2016-01-23
  Administered 2016-01-23: 40 meq via ORAL
  Filled 2016-01-23: qty 2

## 2016-01-23 MED ORDER — MORPHINE SULFATE (PF) 4 MG/ML IV SOLN
4.0000 mg | Freq: Once | INTRAVENOUS | Status: AC
  Administered 2016-01-23: 4 mg via INTRAVENOUS
  Filled 2016-01-23: qty 1

## 2016-01-23 MED ORDER — ONDANSETRON HCL 4 MG/2ML IJ SOLN
4.0000 mg | Freq: Once | INTRAMUSCULAR | Status: AC
  Administered 2016-01-23: 4 mg via INTRAVENOUS
  Filled 2016-01-23: qty 2

## 2016-01-23 NOTE — ED Notes (Signed)
Pt requesting pain meds MD made aware. Pt given  Ice chips and warm blankets.

## 2016-01-23 NOTE — ED Triage Notes (Signed)
Pt presents via ems from home with CHest pain central chest raditating into neck and back. Denies SHOB and N/V. Pt states around 2200 started having Afib with EMS NSR at 0030. Pt has pacemaker defib. Noted 4 times replacement of pacemaker. Pt on coumadin. Pain 9/10. Nitro lower BP to much per Patient.

## 2016-01-23 NOTE — ED Notes (Signed)
Patient transported to CT 

## 2016-01-23 NOTE — ED Provider Notes (Signed)
[facility] Emergency Department Provider Note   ____________________________________________   First MD Initiated Contact with Patient 01/23/16 [phone]     (approximate)  I have reviewed the triage vital signs and the nursing notes.   HISTORY  Chief Complaint Chest Pain    HPI [name] is a 60 y.o. male who came into the hospital tonight with a concern that he went into A. fib. The patient has a history of atrial fibrillation status post multiple recalled pacemakers. The patient reports that it started around 10 PM and he started having some chest pain afterwards. He reports that the pain did radiate to his neck and his back. Per EMS the patient was in sinus rhythm when they arrived. The patient reports that he has blood flow problems into his subclavian vein. He reports that his chest wall feels swollen and spongy and he's concerned about infection. The patient reports that he has some pressure in his chest because stabbing as well. He reports that it hurts when he takes a deep breath. The patient sees [facility] and he has [employer] pacemaker. The patient endorses some shortness of breath and neck pain. He also has some mild back pain. He is here today for evaluation.   Past Medical History:  Diagnosis Date  . Atrial fibrillation (HCC)   . CHF (congestive heart failure) (HCC)   . Diabetes mellitus without complication (HCC)   . Factor 5 Leiden mutation, heterozygous (HCC)   . Hypertension   . Pacemaker     There are no active problems to display for this patient.   Past Surgical History:  Procedure Laterality Date  . CARDIAC SURGERY    . HERNIA REPAIR    . TRANSURETHRAL RESECTION OF PROSTATE      Prior to Admission medications   Not on File    Allergies Apixaban; Celecoxib; Ciprofloxacin; Dabigatran etexilate mesilate [dabigatran]; Dilaudid [hydromorphone hcl]; Nitroglycerin; Prednisone; and Tramadol  No family history on  file.  Social History Social History  Substance Use Topics  . Smoking status: Former Games developer  . Smokeless tobacco: Never Used  . Alcohol use No    Review of Systems Constitutional: No fever/chills Eyes: No visual changes. ENT: No sore throat. Cardiovascular:  chest pain. Respiratory:  shortness of breath. Gastrointestinal: No abdominal pain.  No nausea, no vomiting.  No diarrhea.  No constipation. Genitourinary: Negative for dysuria. Musculoskeletal: Negative for back pain. Skin: Negative for rash. Neurological: Negative for headaches, focal weakness or numbness.  10-point ROS otherwise negative.  ____________________________________________   PHYSICAL EXAM:  VITAL SIGNS: ED Triage Vitals  Enc Vitals Group     BP 01/23/16 0102 133/79     Pulse Rate 01/23/16 0102 91     Resp 01/23/16 0102 12     Temp 01/23/16 0102 98.1 F (36.7 C)     Temp Source 01/23/16 0102 Oral     SpO2 01/23/16 0102 99 %     Weight 01/23/16 0105 203 lb (92.1 kg)     Height 01/23/16 0105 5\' 5"  (1.651 m)     Head Circumference --      Peak Flow --      Pain Score 01/23/16 0106 9     Pain Loc --      Pain Edu? --      Excl. in GC? --     Constitutional: Alert and oriented. Well appearing and in mild distress. Eyes: Conjunctivae are normal. PERRL. EOMI. Head: Atraumatic. Nose: No  congestion/rhinnorhea. Mouth/Throat: Mucous membranes are moist.  Oropharynx non-erythematous. Cardiovascular: Normal rate, regular rhythm. Grossly normal heart sounds.  Good peripheral circulation. Respiratory: Normal respiratory effort.  No retractions. Lungs CTAB. Gastrointestinal: Soft and nontender. No distention. Positive bowel sounds Musculoskeletal: No lower extremity tenderness nor edema.   Neurologic:  Normal speech and language.  Skin:  Skin is warm, dry and intact.  Psychiatric: Mood and affect are normal.  ____________________________________________   LABS (all labs ordered are listed, but only  abnormal results are displayed)  Labs Reviewed  CBC - Abnormal; Notable for the following:       Result Value   Hemoglobin 10.4 (*)    HCT 33.7 (*)    MCV 58.5 (*)    MCH 18.0 (*)    MCHC 30.8 (*)    RDW 20.7 (*)    All other components within normal limits  COMPREHENSIVE METABOLIC PANEL - Abnormal; Notable for the following:    Potassium 2.8 (*)    Chloride 100 (*)    Glucose, Bld 124 (*)    ALT 16 (*)    All other components within normal limits  FIBRIN DERIVATIVES D-DIMER ([facility] ONLY) - Abnormal; Notable for the following:    Fibrin derivatives D-dimer [facility] 2,504 (*)    All other components within normal limits  PROTIME-INR - Abnormal; Notable for the following:    Prothrombin Time 39.0 (*)    All other components within normal limits  TROPONIN I  TROPONIN I   ____________________________________________  EKG  ED ECG REPORT I, Rebecka ApleyWebster,  Kla Bily P, the attending physician, personally viewed and interpreted this ECG.   Date: 01/23/2016  EKG Time: 0056  Rate: 88  Rhythm: normal sinus rhythm  Axis: normal  Intervals:none  ST&T Change: none  ____________________________________________  RADIOLOGY  CXR CT angioest ____________________________________________   PROCEDURES  Procedure(s) performed: None  Procedures  Critical Care performed: No  ____________________________________________   INITIAL IMPRESSION / ASSESSMENT AND PLAN / ED COURSE  Pertinent labs & imaging results that were available during my care of the patient were reviewed by me and considered in my medical decision making (see chart for details).  This is a 60 year old male who comes into the hospital today with some chest pain. The patient reports that he thinks he went into A. fib. We will interrogate the patient's pacemaker and I will give him a dose of morphine as he cannot take nitroglycerin. I will check some blood work and no reassess the patient's blood in an hour.  Clinical  Course as of Jan 22 749  Wed Jan 23, 2016  0308 No acute cardiopulmonary disease. DG Chest 2 View [AW]  [phone] Negative for acute pulmonary embolism. Mild dependent ground-glass opacities without consolidation or effusion. Cardiomegaly.   CT Angio Chest PE W and/or Wo Contrast [AW]    Clinical Course User Index [AW] Rebecka ApleyAllison P Kimba Lottes, MD    The patient received some morphine for pain. The patient continued to complain of pain so I offered to admit him for observation but he declined. The patient's interrogation report did not show any abnormal rhythms. The patient reports that he does not want to be admitted and he rather just go home. His troponins are negative and I did give the patient a dose of potassium chloride. The patient will be discharged. ____________________________________________   FINAL CLINICAL IMPRESSION(S) / ED DIAGNOSES  Final diagnoses:  Nonspecific chest pain  Palpitations      NEW MEDICATIONS STARTED DURING THIS VISIT:  New Prescriptions   No medications on file     Note:  This document was prepared using Dragon voice recognition software and may include unintentional dictation errors.    Rebecka ApleyAllison P Emelda Kohlbeck, MD 01/23/16 [phone]

## 2016-01-23 NOTE — ED Notes (Signed)
Attempt to get Blood from IV and attempted blood draw with butterfly x 2. Pt requested lab to draw.

## 2016-01-23 NOTE — ED Notes (Signed)
Dr Zenda Alperswebster informed pt would like more pain medication.

## 2016-01-23 NOTE — ED Notes (Signed)
Intergation of pacemaker in progress.

## 2016-01-23 NOTE — ED Notes (Signed)
[employer] Aeronautical engineerTransmitter for ICD pacemaker information not sent from 0430. Called [phone] while on hold re sumitted pacemakers interogation at 0700. Per It tech they received information but it wasn't faxed not sure why. It tech given call back phone number email and fax number. Finally received fax at [phone]. Given  to MD at this time. Pt continues to asked for pain meds MD aware.

## 2016-07-25 DIAGNOSIS — Z86718 Personal history of other venous thrombosis and embolism: Secondary | ICD-10-CM | POA: Insufficient documentation

## 2016-08-29 DIAGNOSIS — H2513 Age-related nuclear cataract, bilateral: Secondary | ICD-10-CM | POA: Insufficient documentation

## 2016-11-03 ENCOUNTER — Ambulatory Visit
Admission: EM | Admit: 2016-11-03 | Discharge: 2016-11-03 | Disposition: A | Discharge status: Other Acute Inpatient Hospital | Payer: Medicare Other | Attending: Family Medicine | Admitting: Family Medicine

## 2016-11-03 ENCOUNTER — Emergency Department
Admission: EM | Admit: 2016-11-03 | Discharge: 2016-11-03 | Disposition: A | Discharge status: Home / Self Care | Payer: Medicare Other | Attending: Emergency Medicine | Admitting: Emergency Medicine

## 2016-11-03 ENCOUNTER — Emergency Department: Payer: Medicare Other

## 2016-11-03 ENCOUNTER — Encounter: Payer: Self-pay | Admitting: Emergency Medicine

## 2016-11-03 DIAGNOSIS — R040 Epistaxis: Secondary | ICD-10-CM

## 2016-11-03 DIAGNOSIS — Z79891 Long term (current) use of opiate analgesic: Secondary | ICD-10-CM | POA: Diagnosis not present

## 2016-11-03 DIAGNOSIS — H538 Other visual disturbances: Secondary | ICD-10-CM | POA: Diagnosis not present

## 2016-11-03 DIAGNOSIS — I110 Hypertensive heart disease with heart failure: Secondary | ICD-10-CM | POA: Insufficient documentation

## 2016-11-03 DIAGNOSIS — R519 Headache, unspecified: Secondary | ICD-10-CM

## 2016-11-03 DIAGNOSIS — R51 Headache: Secondary | ICD-10-CM | POA: Diagnosis not present

## 2016-11-03 DIAGNOSIS — Z950 Presence of cardiac pacemaker: Secondary | ICD-10-CM | POA: Diagnosis not present

## 2016-11-03 DIAGNOSIS — I509 Heart failure, unspecified: Secondary | ICD-10-CM | POA: Insufficient documentation

## 2016-11-03 DIAGNOSIS — Z7984 Long term (current) use of oral hypoglycemic drugs: Secondary | ICD-10-CM | POA: Insufficient documentation

## 2016-11-03 DIAGNOSIS — Z87891 Personal history of nicotine dependence: Secondary | ICD-10-CM | POA: Insufficient documentation

## 2016-11-03 DIAGNOSIS — R202 Paresthesia of skin: Secondary | ICD-10-CM | POA: Diagnosis not present

## 2016-11-03 DIAGNOSIS — E119 Type 2 diabetes mellitus without complications: Secondary | ICD-10-CM | POA: Insufficient documentation

## 2016-11-03 DIAGNOSIS — Z7901 Long term (current) use of anticoagulants: Secondary | ICD-10-CM

## 2016-11-03 DIAGNOSIS — Z79899 Other long term (current) drug therapy: Secondary | ICD-10-CM | POA: Diagnosis not present

## 2016-11-03 LAB — PROTIME-INR
INR: 3.29
Prothrombin Time: 33.2 seconds — ABNORMAL HIGH (ref 11.4–15.2)

## 2016-11-03 LAB — CBC
HCT: 30.9 % — ABNORMAL LOW (ref 40.0–52.0)
Hemoglobin: 9.1 g/dL — ABNORMAL LOW (ref 13.0–18.0)
MCH: 16.2 pg — ABNORMAL LOW (ref 26.0–34.0)
MCHC: 29.5 g/dL — ABNORMAL LOW (ref 32.0–36.0)
MCV: 54.8 fL — ABNORMAL LOW (ref 80.0–100.0)
Platelets: 296 10*3/uL (ref 150–440)
RBC: 5.63 MIL/uL (ref 4.40–5.90)
RDW: 22.8 % — ABNORMAL HIGH (ref 11.5–14.5)
WBC: 8.9 10*3/uL (ref 3.8–10.6)

## 2016-11-03 LAB — BASIC METABOLIC PANEL
Anion gap: 10 (ref 5–15)
BUN: 11 mg/dL (ref 6–20)
CO2: 23 mmol/L (ref 22–32)
Calcium: 9.4 mg/dL (ref 8.9–10.3)
Chloride: 103 mmol/L (ref 101–111)
Creatinine, Ser: 1.11 mg/dL (ref 0.61–1.24)
GFR calc Af Amer: >60 mL/min (ref >60)
GFR calc non Af Amer: >60 mL/min (ref >60)
Glucose, Bld: 108 mg/dL — ABNORMAL HIGH (ref 65–99)
Potassium: 3.4 mmol/L — ABNORMAL LOW (ref 3.5–5.1)
Sodium: 136 mmol/L (ref 135–145)

## 2016-11-03 LAB — TROPONIN I: Troponin I: <0.03 ng/mL (ref <0.03)

## 2016-11-03 LAB — APTT: aPTT: 62 seconds — ABNORMAL HIGH (ref 24–36)

## 2016-11-03 NOTE — ED Provider Notes (Signed)
[facility]    CSN: [id number] Arrival date & time: 11/03/16  1529     History   Chief Complaint Chief Complaint  Patient presents with  . Headache  . Blurred Vision    HPI [name] is a 61 y.o. male.   61 yo male with a c/o sudden onset of sever headache and blurred vision today as well as a nose bleed. States nose bleed resolved, however headache and blurred vision continue. States headache is in the back and mainly right side. Patient states he has a h/o afib, blood clots, chf and is on coumadin, last check last week per patient and INR was 3.1.    The history is provided by the patient.    Past Medical History:  Diagnosis Date  . Atrial fibrillation (HCC)   . CHF (congestive heart failure) (HCC)   . Diabetes mellitus without complication (HCC)   . Factor 5 Leiden mutation, heterozygous (HCC)   . Hypertension   . Pacemaker     There are no active problems to display for this patient.   Past Surgical History:  Procedure Laterality Date  . CARDIAC SURGERY    . HERNIA REPAIR    . TRANSURETHRAL RESECTION OF PROSTATE         Home Medications    Prior to Admission medications   Medication Sig Start Date End Date Taking? Authorizing Provider  hydrochlorothiazide (HYDRODIURIL) 25 MG tablet Take 25 mg by mouth daily.   Yes [provider]  LOVASTATIN PO Take by mouth daily.   Yes [provider]  metFORMIN (GLUCOPHAGE) 500 MG tablet Take by mouth 2 (two) times daily with a meal.   Yes [provider]  metoprolol tartrate (LOPRESSOR) 100 MG tablet Take 100 mg by mouth daily.   Yes [provider]  nicotine polacrilex (NICORETTE) 4 MG gum Take 4 mg by mouth as needed for smoking cessation.   Yes [provider]  oxybutynin (DITROPAN-XL) 10 MG 24 hr tablet Take 10 mg by mouth at bedtime.   Yes [provider]  oxyCODONE-acetaminophen (PERCOCET) 10-325 MG tablet Take 1 tablet by mouth every 6  (six) hours as needed for pain.   Yes [provider]  pantoprazole (PROTONIX) 20 MG tablet Take 20 mg by mouth 2 (two) times daily.   Yes [provider]  warfarin (COUMADIN) 10 MG tablet Take 10 mg by mouth daily.   Yes [provider]  zolpidem (AMBIEN) 10 MG tablet Take 10 mg by mouth at bedtime.   Yes [provider]    Family History Family History  Problem Relation Age of Onset  . Diabetes Mother   . CAD Mother   . Hypertension Mother   . CAD Father   . Hyperlipidemia Father   . Hypertension Father     Social History Social History  Substance Use Topics  . Smoking status: Former Games developer  . Smokeless tobacco: Never Used  . Alcohol use No     Allergies   Apixaban; Celecoxib; Ciprofloxacin; Dabigatran etexilate mesilate [dabigatran]; Dilaudid [hydromorphone hcl]; Nitroglycerin; Prednisone; and Tramadol   Review of Systems Review of Systems   Physical Exam Triage Vital Signs ED Triage Vitals  Enc Vitals Group     BP 11/03/16 1539 (!) 128/59     Pulse Rate 11/03/16 1539 87     Resp 11/03/16 1539 16     Temp 11/03/16 1539 98.5 F (36.9 C)     Temp Source 11/03/16  1539 Oral     SpO2 11/03/16 1539 100 %     Weight 11/03/16 1541 201 lb (91.2 kg)     Height 11/03/16 1541  (1.626 m)     Head Circumference --      Peak Flow --      Pain Score 11/03/16 1541 8     Pain Loc --      Pain Edu? --      Excl. in GC? --    No data found.   Updated Vital Signs BP (!) 128/59 (BP Location: Left Arm)   Pulse 87   Temp 98.5 F (36.9 C) (Oral)   Resp 16   Ht  (1.626 m)   Wt 201 lb (91.2 kg)   SpO2 100%   BMI 34.50 kg/m   Visual Acuity Right Eye Distance: 20/25 Left Eye Distance: 20/20 Bilateral Distance: 20/20  Right Eye Near:   Left Eye Near:    Bilateral Near:     Physical Exam  Constitutional: He is oriented to person, place, and time. He appears well-developed and well-nourished. No distress.  HENT:  Head:  Normocephalic and atraumatic.  Right Ear: Tympanic membrane, external ear and ear canal normal.  Left Ear: Tympanic membrane, external ear and ear canal normal.  Nose: Epistaxis (dried blood noted in anterior left nare) is observed.  Mouth/Throat: Uvula is midline, oropharynx is clear and moist and mucous membranes are normal. No oropharyngeal exudate or tonsillar abscesses.  Eyes: Pupils are equal, round, and reactive to light. Conjunctivae and EOM are normal. Right eye exhibits no discharge. Left eye exhibits no discharge. No scleral icterus.  Neck: Normal range of motion. Neck supple. No tracheal deviation present. No thyromegaly present.  Cardiovascular: Normal rate, regular rhythm and normal heart sounds.   Pulmonary/Chest: Effort normal and breath sounds normal. No stridor. No respiratory distress. He has no wheezes. He has no rales. He exhibits no tenderness.  Lymphadenopathy:    He has no cervical adenopathy.  Neurological: He is alert and oriented to person, place, and time. He displays normal reflexes. No cranial nerve deficit or sensory deficit. He exhibits normal muscle tone. Coordination normal.  Skin: Skin is warm and dry. No rash noted. He is not diaphoretic.  Nursing note and vitals reviewed.    UC Treatments / Results  Labs (all labs ordered are listed, but only abnormal results are displayed) Labs Reviewed - No data to display  EKG  EKG Interpretation None       Radiology Dg Chest 2 View  Result Date: 11/03/2016 CLINICAL DATA:  Chest pain. EXAM: CHEST  2 VIEW COMPARISON:  Chest x-ray dated January 23, 2016. FINDINGS: Unchanged right chest wall AICD. The heart size and mediastinal contours are within normal limits. Both lungs are clear. The visualized skeletal structures are unremarkable. IMPRESSION: No active cardiopulmonary disease. Electronically Signed   By: Obie Dredge M.D.   On: 11/03/2016 17:37   Ct Head Wo Contrast  Result Date: 11/03/2016 CLINICAL  DATA:  Headache and blurry vision. EXAM: CT HEAD WITHOUT CONTRAST TECHNIQUE: Contiguous axial images were obtained from the base of the skull through the vertex without intravenous contrast. COMPARISON:  CT head dated Jun 19, 2011. FINDINGS: Brain: No evidence of acute infarction, hemorrhage, hydrocephalus, extra-axial collection or mass lesion/mass effect. Scattered mild periventricular white matter and corona radiata hypodensities favor chronic ischemic microvascular white matter disease. Vascular: Atherosclerotic vascular calcification of the carotid siphons. No hyperdense vessel. Skull: Normal. Negative for fracture  or focal lesion. Sinuses/Orbits: The bilateral paranasal sinuses and mastoid air cells are clear. The orbits are unremarkable. Other: None. IMPRESSION: No acute intracranial abnormality. Electronically Signed   By: Obie Dredge M.D.   On: 11/03/2016 17:46    Procedures Procedures (including critical care time)  Medications Ordered in UC Medications - No data to display   Initial Impression / Assessment and Plan / UC Course  I have reviewed the triage vital signs and the nursing notes.  Pertinent labs & imaging results that were available during my care of the patient were reviewed by me and considered in my medical decision making (see chart for details).       Final Clinical Impressions(s) / UC Diagnoses   Final diagnoses:  Severe headache  Blurred vision  Epistaxis  Chronic anticoagulation    New Prescriptions Discharge Medication List as of 11/03/2016  4:15 PM     1. Possible diagnoses reviewed with patient; due to patient chronic medical problems and risk factors as well as sudden onset of symptoms today including severe headache and nose bleed, recommend patient go to Emergency Department for further evaluation and management. Patient refuses transport by ambulance and states he will go by private vehicle.   Controlled Substance Prescriptions [place] Controlled  Substance Registry consulted? Not Applicable   Payton Mccallum, MD 11/03/16 [phone]

## 2016-11-03 NOTE — ED Triage Notes (Signed)
Patient in today c/o headache and blurry vision. Patient had a nose bleed earlier today. Patient is on Coumadin.

## 2016-11-03 NOTE — ED Triage Notes (Addendum)
C/o right sided headache, blurry vision, right sided weakness that started at [facility] today. Pt had nose bleed earlier today. Pt c/o chest pain and SOB as well for the last few days, believes that he has a blood clot in lungs. Pt takes warfarin, complaint with medications. Pt c/o right sided abdominal pain, increase urination over the evening.   Pt ambulates normally, carries full conversation with this RN. Negative stroke screen. Full strength grip of upper extremities, full strength of lower extremities. Equal symmetry of face and mouth. Normal speech.

## 2016-11-03 NOTE — ED Notes (Signed)
Pt verbalizes discharge teaching. PT in NAD at time of d/c. PT unable to sign due to signature pad malfnx

## 2016-11-03 NOTE — ED Provider Notes (Addendum)
[facility] Emergency Department Provider Note  ____________________________________________   I have reviewed the triage vital signs and the nursing notes.   HISTORY  Chief Complaint Headache; Blurred Vision; and Weakness    HPI [name] is a 61 y.o. male History of factor V Leiden, CHF diabetes A. fib AICD, states that he gets most of his care in [place]. He is here visiting his fiance. He states that he had a nosebleed earlier today and that led to a headache which is now gone, at the same time he felt like his vision was blurry, he had tingling in the right foot , specifically in the 3 smallest toes of his right foot, he was worried that is chronic abdominal pain was still present and in general he began to have multiple different complaints and worries. He states now he feels much better and he is more relaxed. He denies any focal neurologic symptoms she states that the tingling in his toes is gotten better he had no weakness or focal numbness aside from the toes. His headache was not the worst headache of life, he is mostly concerned that he had a nosebleed but that stopped 2, patient states that earlier he felt like he might be short of breath but he is not short of breath now and he states that he sometimes gets that way. He denies any ongoing shortness of breath chest pain or dyspnea. Patient states that he has had no fever no chills, he denies any change in his chronic minor abdominal pain which is another complaint of his. He has a great deal of difficulty deciding what his main complaint is he has many different things that he wants to discuss but very few of them are either ongoing or acute. Patient, once E her that his CT was negative, states that he would like to go home. He has no further complaints and he is eager to leave and get some food. He does not want any further intervention.     Past Medical History:  Diagnosis Date  . Atrial fibrillation  (HCC)   . CHF (congestive heart failure) (HCC)   . Diabetes mellitus without complication (HCC)   . Factor 5 Leiden mutation, heterozygous (HCC)   . Hypertension   . Pacemaker     There are no active problems to display for this patient.   Past Surgical History:  Procedure Laterality Date  . CARDIAC SURGERY    . HERNIA REPAIR    . TRANSURETHRAL RESECTION OF PROSTATE      Prior to Admission medications   Medication Sig Start Date End Date Taking? Authorizing Provider  hydrochlorothiazide (HYDRODIURIL) 25 MG tablet Take 25 mg by mouth daily.    [provider]  LOVASTATIN PO Take by mouth daily.    [provider]  metFORMIN (GLUCOPHAGE) 500 MG tablet Take by mouth 2 (two) times daily with a meal.    [provider]  metoprolol tartrate (LOPRESSOR) 100 MG tablet Take 100 mg by mouth daily.    [provider]  nicotine polacrilex (NICORETTE) 4 MG gum Take 4 mg by mouth as needed for smoking cessation.    [provider]  oxybutynin (DITROPAN-XL) 10 MG 24 hr tablet Take 10 mg by mouth at bedtime.    [provider]  oxyCODONE-acetaminophen (PERCOCET) 10-325 MG tablet Take 1 tablet by mouth every 6 (six) hours as needed for pain.    [provider]  pantoprazole (PROTONIX) 20 MG  tablet Take 20 mg by mouth 2 (two) times daily.    [provider]  warfarin (COUMADIN) 10 MG tablet Take 10 mg by mouth daily.    [provider]  zolpidem (AMBIEN) 10 MG tablet Take 10 mg by mouth at bedtime.    [provider]    Allergies Apixaban; Celecoxib; Ciprofloxacin; Dabigatran etexilate mesilate [dabigatran]; Dilaudid [hydromorphone hcl]; Nitroglycerin; Prednisone; and Tramadol  Family History  Problem Relation Age of Onset  . Diabetes Mother   . CAD Mother   . Hypertension Mother   . CAD Father   . Hyperlipidemia Father   . Hypertension Father     Social History Social History  Substance Use Topics   . Smoking status: Former Games developer  . Smokeless tobacco: Never Used  . Alcohol use No    Review of Systems Constitutional: No fever/chills Eyes: No visual changes. ENT: No sore throat. No stiff neck no neck pain Cardiovascular: Denies chest pain. Respiratory: Denies shortness of breath. Gastrointestinal:   no vomiting.  No diarrhea.  No constipation. Genitourinary: Negative for dysuria. Musculoskeletal: Negative lower extremity swelling Skin: Negative for rash. Neurological: Negative for severe headachesdid have a vaginal onset not worst of life headache which is now gone he states, focal weakness or numbnessaside from the last 3 toes of his right foot which is now gone.   ____________________________________________   PHYSICAL EXAM:  VITAL SIGNS: ED Triage Vitals  Enc Vitals Group     BP 11/03/16 1647 133/71     Pulse Rate 11/03/16 1647 87     Resp 11/03/16 1647 16     Temp 11/03/16 1647 98.3 F (36.8 C)     Temp Source 11/03/16 1647 Oral     SpO2 11/03/16 1647 99 %     Weight 11/03/16 1648 201 lb (91.2 kg)     Height 11/03/16 1648  (1.626 m)     Head Circumference --      Peak Flow --      Pain Score 11/03/16 1647 9     Pain Loc --      Pain Edu? --      Excl. in GC? --     Constitutional: Alert and oriented. Well appearing and in no acute distress. Eyes: Conjunctivae are normal Head: Atraumatic HEENT: No congestion/rhinnorhea. Mucous membranes are moist.  Oropharynx non-erythematous Neck:   Nontender with no meningismus, no masses, no stridor Cardiovascular: Normal rate, regular rhythm. Grossly normal heart sounds.  Good peripheral circulation. Respiratory: Normal respiratory effort.  No retractions. Lungs CTAB. Abdominal: Soft and nontender. No distention. No guarding no rebound Back:  There is no focal tenderness or step off.  there is no midline tenderness there are no lesions noted. there is no CVA tenderness Musculoskeletal: No lower extremity  tenderness, no upper extremity tenderness. No joint effusions, no DVT signs strong distal pulses no edema Neurologic:  Cranial nerves II through XII are grossly intact 5 out of 5 strength bilateral upper and lower extremity. Finger to nose within normal limits heel to shin within normal limits, speech is normal with no word finding difficulty or dysarthria, reflexes symmetric, pupils are equally round and reactive to light, there is no pronator drift, sensation is normal, vision is intact to confrontation, gait is deferred, there is no nystagmus, normal neurologic exam Skin:  Skin is warm, dry and intact. No rash noted. Psychiatric: Mood and affect are normal. Speech and behavior are normal.  ____________________________________________   LABS (all labs ordered  are listed, but only abnormal results are displayed)  Labs Reviewed  BASIC METABOLIC PANEL - Abnormal; Notable for the following:       Result Value   Potassium 3.4 (*)    Glucose, Bld 108 (*)    All other components within normal limits  CBC - Abnormal; Notable for the following:    Hemoglobin 9.1 (*)    HCT 30.9 (*)    MCV 54.8 (*)    MCH 16.2 (*)    MCHC 29.5 (*)    RDW 22.8 (*)    All other components within normal limits  PROTIME-INR - Abnormal; Notable for the following:    Prothrombin Time 33.2 (*)    All other components within normal limits  APTT - Abnormal; Notable for the following:    aPTT 62 (*)    All other components within normal limits  TROPONIN I    Pertinent labs  results that were available during my care of the patient were reviewed by me and considered in my medical decision making (see chart for details). ____________________________________________  EKG  I personally interpreted any EKGs ordered by me or triage normal sinus rhythm at 79 bpm no acute ST elevation or acute ST depression normal axis unremarkable EKG ____________________________________________  RADIOLOGY  Pertinent labs &  imaging results that were available during my care of the patient were reviewed by me and considered in my medical decision making (see chart for details). If possible, patient and/or family made aware of any abnormal findings. ____________________________________________    PROCEDURES  Procedure(s) performed: None  Procedures  Critical Care performed: None  ____________________________________________   INITIAL IMPRESSION / ASSESSMENT AND PLAN / ED COURSE  Pertinent labs & imaging results that were available during my care of the patient were reviewed by me and considered in my medical decision making (see chart for details).  patient here with a host of different aches pains and complaints . He was sent here because he was having a headache which she states was a normal headache for him but he got anxious because there was a nosebleed associated and he wanted to make sure his Coumadin levels were okay. He is satisfied that they are and he would like to go home. CT is negative neurologic exam is negative and age stroke scale is 0. Hemoglobin is slightly low he states he has a history of chronic anemia,9 months ago was 10.4. He denies rectal bleeding, he declines rectal exam. All in all he states he that he get some food and go home. He declines to eat food with me or from [place], his sisters getting him food from the [employer] and he would like to be discharged. given patient's very reassuring workup, his close outpatient follow-up availability, and the fact that he declines further intervention and the fact that all of the symptoms seemed as resolved, we will discharge with close outpatient follow-up. Return precautions given and understood and he understands if he changes his mind about further care has other complaints she concerned and return  Patient shows a this time no evidence of cavernous thrombosis, CVA, head bleed, mass, tumor,At this time, there does not appear to be clinical  evidence to support the diagnosis of pulmonary embolus, dissection, myocarditis, endocarditis, pericarditis, pericardial tamponade, acute coronary syndrome, pneumothorax, pneumonia, or any other acute intrathoracic pathology that will require admission or acute intervention. Nor is there evidence of any significant intra-abdominal pathology causing this discomfort.Considering the patient's symptoms, medical history, and physical  examination today, I have low suspicion for cholecystitis or biliary pathology, pancreatitis, perforation or bowel obstruction, hernia, intra-abdominal abscess, AAA or dissection, volvulus or intussusception, mesenteric ischemia, ischemic gut, pyelonephritis or appendicitis., all in all very reassuring exam however again given the patient refuses further care it is difficult for [place] to further evaluate him   ____________________________________________   FINAL CLINICAL IMPRESSION(S) / ED DIAGNOSES  Final diagnoses:  None      This chart was dictated using voice recognition software.  Despite best efforts to proofread,  errors can occur which can change meaning.      Jeanmarie Plant, MD 11/03/16 2037    Jeanmarie Plant, MD 11/03/16 2038

## 2016-11-03 NOTE — Discharge Instructions (Signed)
Recommend patient go to Emergency Department for further evaluation °

## 2017-02-24 DIAGNOSIS — G4730 Sleep apnea, unspecified: Secondary | ICD-10-CM | POA: Insufficient documentation

## 2017-10-14 IMAGING — CR DG CHEST 2V
1 series · 2 of 2 positions shown · non-contrast
Comparison: Chest x-ray dated January 23, 2016.

CLINICAL DATA: Chest pain.

EXAM:
CHEST  2 VIEW

[Series 1: dg chest 2 view · 0.14mm/px · 2 of 2 slices shown]
[im 1/2]
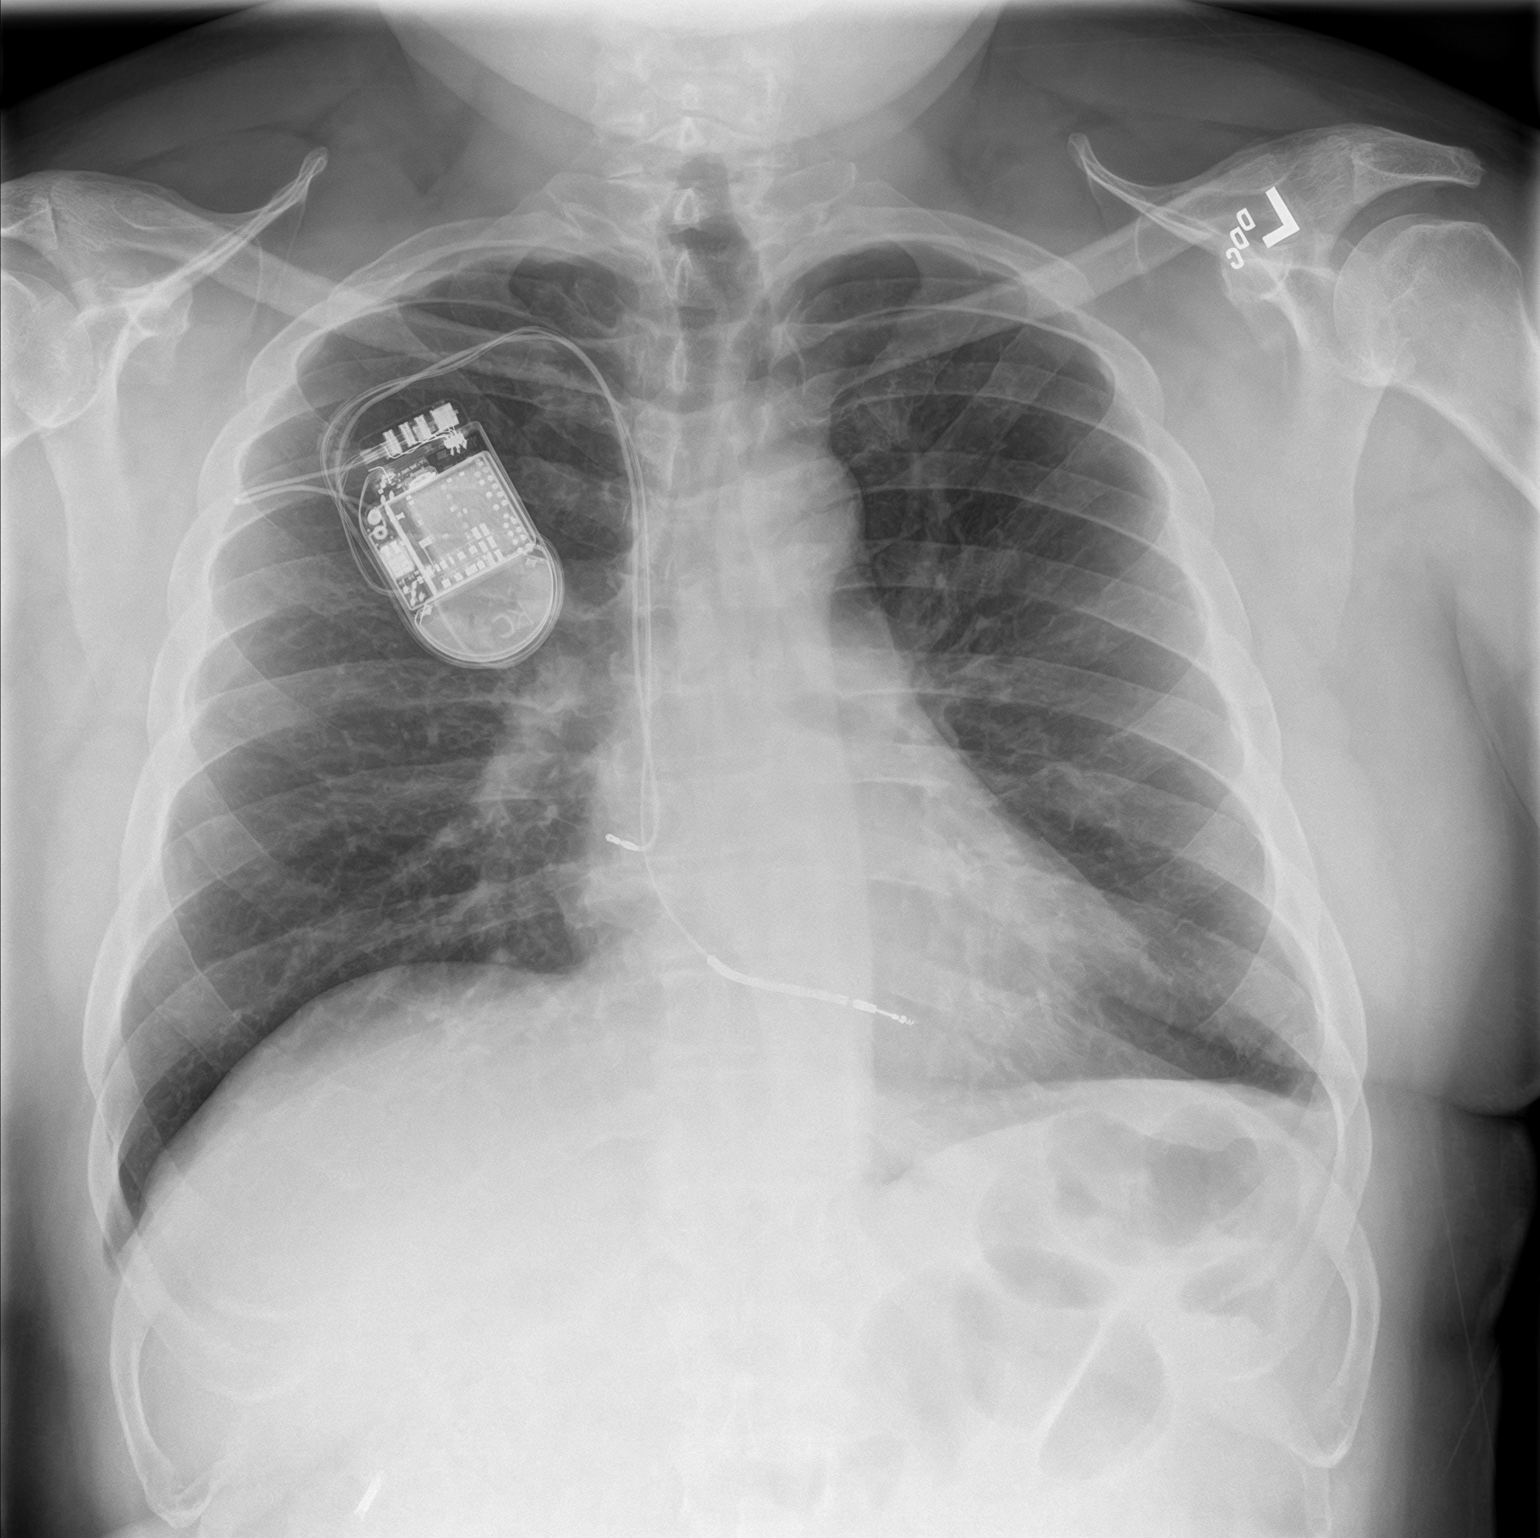
[im 2/2]
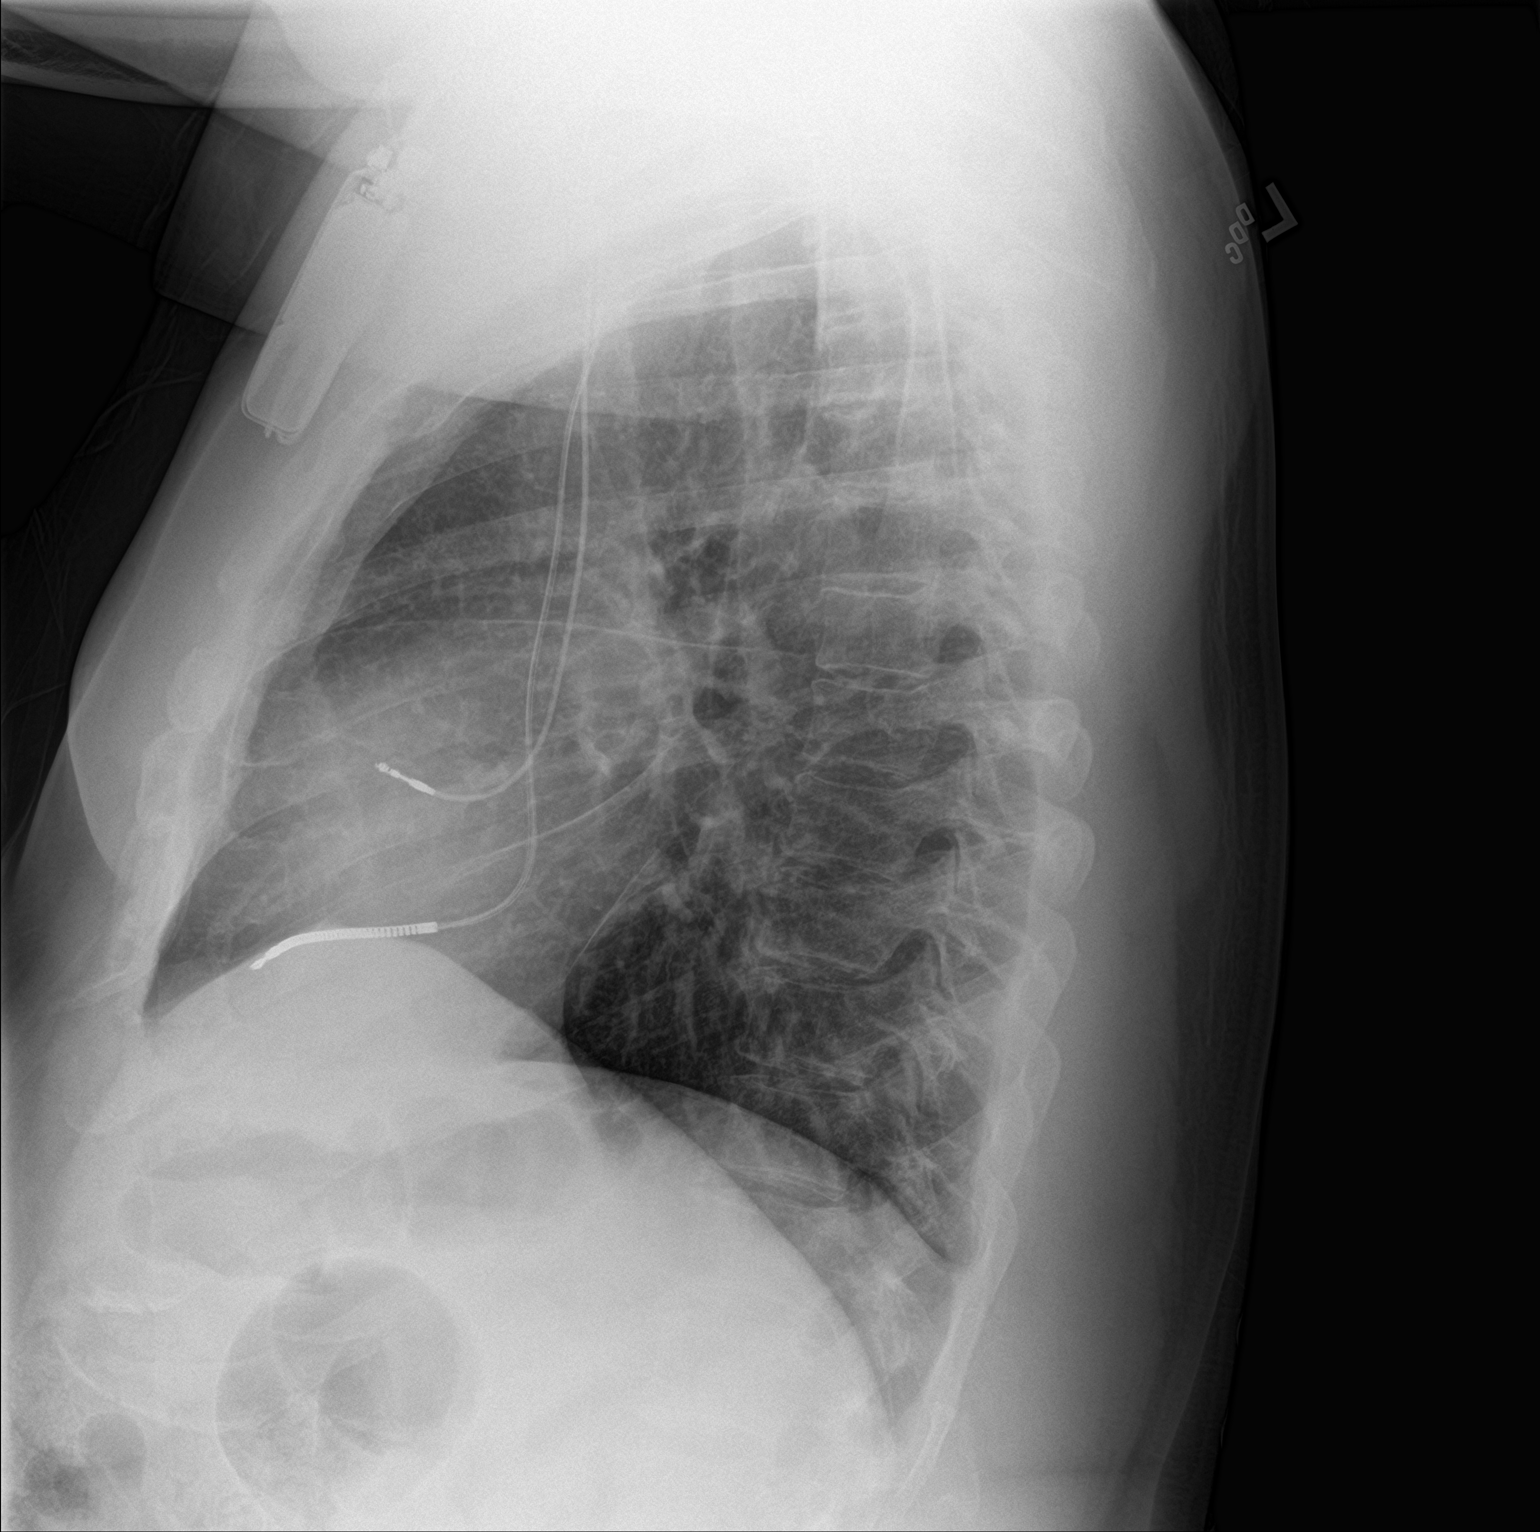

[2 of 2 positions shown; findings below may reference images not displayed]

FINDINGS: Unchanged right chest wall AICD. The heart size and mediastinal
contours are within normal limits. Both lungs are clear. The
visualized skeletal structures are unremarkable.
IMPRESSION: No active cardiopulmonary disease.

## 2019-11-12 DIAGNOSIS — K625 Hemorrhage of anus and rectum: Secondary | ICD-10-CM | POA: Insufficient documentation

## 2021-08-01 ENCOUNTER — Other Ambulatory Visit: Payer: Self-pay

## 2021-08-01 ENCOUNTER — Observation Stay
Admission: EM | Admit: 2021-08-01 | Discharge: 2021-08-02 | Disposition: A | Discharge status: Home / Self Care | Payer: Medicare PPO | Attending: Internal Medicine | Admitting: Internal Medicine

## 2021-08-01 ENCOUNTER — Emergency Department: Payer: Medicare PPO

## 2021-08-01 DIAGNOSIS — I5032 Chronic diastolic (congestive) heart failure: Secondary | ICD-10-CM | POA: Diagnosis not present

## 2021-08-01 DIAGNOSIS — R079 Chest pain, unspecified: Principal | ICD-10-CM | POA: Diagnosis present

## 2021-08-01 DIAGNOSIS — Z7902 Long term (current) use of antithrombotics/antiplatelets: Secondary | ICD-10-CM | POA: Diagnosis not present

## 2021-08-01 DIAGNOSIS — I2510 Atherosclerotic heart disease of native coronary artery without angina pectoris: Secondary | ICD-10-CM | POA: Diagnosis present

## 2021-08-01 DIAGNOSIS — I482 Chronic atrial fibrillation, unspecified: Secondary | ICD-10-CM | POA: Diagnosis not present

## 2021-08-01 DIAGNOSIS — I10 Essential (primary) hypertension: Secondary | ICD-10-CM | POA: Diagnosis present

## 2021-08-01 DIAGNOSIS — Z7901 Long term (current) use of anticoagulants: Secondary | ICD-10-CM | POA: Insufficient documentation

## 2021-08-01 DIAGNOSIS — E119 Type 2 diabetes mellitus without complications: Secondary | ICD-10-CM

## 2021-08-01 DIAGNOSIS — Z79899 Other long term (current) drug therapy: Secondary | ICD-10-CM | POA: Insufficient documentation

## 2021-08-01 DIAGNOSIS — Z950 Presence of cardiac pacemaker: Secondary | ICD-10-CM | POA: Diagnosis not present

## 2021-08-01 DIAGNOSIS — Z955 Presence of coronary angioplasty implant and graft: Secondary | ICD-10-CM | POA: Insufficient documentation

## 2021-08-01 DIAGNOSIS — Z87891 Personal history of nicotine dependence: Secondary | ICD-10-CM | POA: Insufficient documentation

## 2021-08-01 DIAGNOSIS — I110 Hypertensive heart disease with heart failure: Secondary | ICD-10-CM | POA: Insufficient documentation

## 2021-08-01 DIAGNOSIS — I5030 Unspecified diastolic (congestive) heart failure: Secondary | ICD-10-CM | POA: Diagnosis not present

## 2021-08-01 DIAGNOSIS — E876 Hypokalemia: Secondary | ICD-10-CM | POA: Diagnosis not present

## 2021-08-01 DIAGNOSIS — Z7984 Long term (current) use of oral hypoglycemic drugs: Secondary | ICD-10-CM | POA: Diagnosis not present

## 2021-08-01 DIAGNOSIS — E1169 Type 2 diabetes mellitus with other specified complication: Secondary | ICD-10-CM | POA: Diagnosis present

## 2021-08-01 DIAGNOSIS — E785 Hyperlipidemia, unspecified: Secondary | ICD-10-CM

## 2021-08-01 LAB — BRAIN NATRIURETIC PEPTIDE: B Natriuretic Peptide: 29.8 pg/mL (ref 0.0–100.0)

## 2021-08-01 LAB — CBC
HCT: 36 % — ABNORMAL LOW (ref 39.0–52.0)
Hemoglobin: 10.2 g/dL — ABNORMAL LOW (ref 13.0–17.0)
MCH: 20.9 pg — ABNORMAL LOW (ref 26.0–34.0)
MCHC: 28.3 g/dL — ABNORMAL LOW (ref 30.0–36.0)
MCV: 73.9 fL — ABNORMAL LOW (ref 80.0–100.0)
Platelets: 378 10*3/uL (ref 150–400)
RBC: 4.87 MIL/uL (ref 4.22–5.81)
RDW: 17.6 % — ABNORMAL HIGH (ref 11.5–15.5)
WBC: 6.7 10*3/uL (ref 4.0–10.5)
nRBC: 0 % (ref 0.0–0.2)

## 2021-08-01 LAB — TROPONIN I (HIGH SENSITIVITY)
Troponin I (High Sensitivity): 9 ng/L (ref <18)
Troponin I (High Sensitivity): 9 ng/L (ref <18)
Troponin I (High Sensitivity): 11 ng/L (ref <18)
Troponin I (High Sensitivity): 8 ng/L (ref <18)
Troponin I (High Sensitivity): 9 ng/L (ref <18)

## 2021-08-01 LAB — LIPID PANEL
Cholesterol: 126 mg/dL (ref 0–200)
HDL: 34 mg/dL — ABNORMAL LOW (ref >40)
LDL Cholesterol: 65 mg/dL (ref 0–99)
Total CHOL/HDL Ratio: 3.7 RATIO
Triglycerides: 137 mg/dL (ref <150)
VLDL: 27 mg/dL (ref 0–40)

## 2021-08-01 LAB — HEMOGLOBIN A1C
Hgb A1c MFr Bld: 5.8 % — ABNORMAL HIGH (ref 4.8–5.6)
Mean Plasma Glucose: 120 mg/dL

## 2021-08-01 LAB — BASIC METABOLIC PANEL
Anion gap: 8 (ref 5–15)
BUN: 12 mg/dL (ref 8–23)
CO2: 24 mmol/L (ref 22–32)
Calcium: 9.3 mg/dL (ref 8.9–10.3)
Chloride: 108 mmol/L (ref 98–111)
Creatinine, Ser: 0.96 mg/dL (ref 0.61–1.24)
GFR, Estimated: >60 mL/min (ref >60)
Glucose, Bld: 150 mg/dL — ABNORMAL HIGH (ref 70–99)
Potassium: 3.2 mmol/L — ABNORMAL LOW (ref 3.5–5.1)
Sodium: 140 mmol/L (ref 135–145)

## 2021-08-01 LAB — URINE DRUG SCREEN, QUALITATIVE (ARMC ONLY)
Amphetamines, Ur Screen: NOT DETECTED
Barbiturates, Ur Screen: NOT DETECTED
Benzodiazepine, Ur Scrn: NOT DETECTED
Cannabinoid 50 Ng, Ur ~~LOC~~: NOT DETECTED
Cocaine Metabolite,Ur ~~LOC~~: NOT DETECTED
MDMA (Ecstasy)Ur Screen: NOT DETECTED
Methadone Scn, Ur: NOT DETECTED
Opiate, Ur Screen: POSITIVE — AB
Phencyclidine (PCP) Ur S: NOT DETECTED
Tricyclic, Ur Screen: NOT DETECTED

## 2021-08-01 LAB — GLUCOSE, CAPILLARY
Glucose-Capillary: 66 mg/dL — ABNORMAL LOW (ref 70–99)
Glucose-Capillary: 88 mg/dL (ref 70–99)

## 2021-08-01 LAB — PROTIME-INR
INR: 1.9 — ABNORMAL HIGH (ref 0.8–1.2)
Prothrombin Time: 21.9 seconds — ABNORMAL HIGH (ref 11.4–15.2)

## 2021-08-01 LAB — CBG MONITORING, ED: Glucose-Capillary: 87 mg/dL (ref 70–99)

## 2021-08-01 LAB — HIV ANTIBODY (ROUTINE TESTING W REFLEX): HIV Screen 4th Generation wRfx: NONREACTIVE

## 2021-08-01 LAB — HEPARIN LEVEL (UNFRACTIONATED): Heparin Unfractionated: 0.33 IU/mL (ref 0.30–0.70)

## 2021-08-01 LAB — MAGNESIUM: Magnesium: 2.3 mg/dL (ref 1.7–2.4)

## 2021-08-01 MED ORDER — AMLODIPINE BESYLATE 5 MG PO TABS
5.0000 mg | ORAL_TABLET | Freq: Two times a day (BID) | ORAL | Status: DC
  Administered 2021-08-01 – 2021-08-02 (×2): 5 mg via ORAL
  Filled 2021-08-01 (×2): qty 1

## 2021-08-01 MED ORDER — ONDANSETRON HCL 4 MG/2ML IJ SOLN
4.0000 mg | Freq: Once | INTRAMUSCULAR | Status: AC
  Administered 2021-08-01: 4 mg via INTRAVENOUS
  Filled 2021-08-01: qty 2

## 2021-08-01 MED ORDER — WARFARIN SODIUM 7.5 MG PO TABS
7.5000 mg | ORAL_TABLET | Freq: Once | ORAL | Status: DC
  Filled 2021-08-01: qty 1

## 2021-08-01 MED ORDER — ALBUTEROL SULFATE (2.5 MG/3ML) 0.083% IN NEBU: 2.5000 mg | INHALATION_SOLUTION | RESPIRATORY_TRACT | Status: DC | PRN

## 2021-08-01 MED ORDER — ONDANSETRON HCL 4 MG PO TABS: 4.0000 mg | ORAL_TABLET | Freq: Four times a day (QID) | ORAL | Status: DC | PRN

## 2021-08-01 MED ORDER — METOPROLOL SUCCINATE ER 50 MG PO TB24
50.0000 mg | ORAL_TABLET | Freq: Two times a day (BID) | ORAL | Status: DC
Start: 2021-08-01 — End: 2021-08-01

## 2021-08-01 MED ORDER — GABAPENTIN 600 MG PO TABS
600.0000 mg | ORAL_TABLET | Freq: Three times a day (TID) | ORAL | Status: DC
  Administered 2021-08-01 – 2021-08-02 (×4): 600 mg via ORAL
  Filled 2021-08-01 (×4): qty 1

## 2021-08-01 MED ORDER — OXYCODONE-ACETAMINOPHEN 10-325 MG PO TABS: 1.0000 | ORAL_TABLET | Freq: Four times a day (QID) | ORAL | Status: DC | PRN

## 2021-08-01 MED ORDER — ASPIRIN 81 MG PO TBEC: 81.0000 mg | DELAYED_RELEASE_TABLET | Freq: Every day | ORAL | Status: DC

## 2021-08-01 MED ORDER — ONDANSETRON HCL 4 MG/2ML IJ SOLN
4.0000 mg | Freq: Four times a day (QID) | INTRAMUSCULAR | Status: DC | PRN
  Administered 2021-08-01 (×2): 4 mg via INTRAVENOUS
  Filled 2021-08-01 (×2): qty 2

## 2021-08-01 MED ORDER — LISINOPRIL 20 MG PO TABS
20.0000 mg | ORAL_TABLET | Freq: Every day | ORAL | Status: DC
  Administered 2021-08-01 – 2021-08-02 (×2): 20 mg via ORAL
  Filled 2021-08-01: qty 2
  Filled 2021-08-01: qty 1

## 2021-08-01 MED ORDER — WARFARIN SODIUM 10 MG PO TABS
10.0000 mg | ORAL_TABLET | Freq: Once | ORAL | Status: AC
Start: 2021-08-01 — End: 2021-08-01
  Administered 2021-08-01: 10 mg via ORAL
  Filled 2021-08-01: qty 1

## 2021-08-01 MED ORDER — ALBUTEROL SULFATE HFA 108 (90 BASE) MCG/ACT IN AERS: 2.0000 | INHALATION_SPRAY | RESPIRATORY_TRACT | Status: DC | PRN

## 2021-08-01 MED ORDER — HEPARIN BOLUS VIA INFUSION
4000.0000 [IU] | Freq: Once | INTRAVENOUS | Status: AC
  Administered 2021-08-01: 4000 [IU] via INTRAVENOUS
  Filled 2021-08-01: qty 4000

## 2021-08-01 MED ORDER — METOPROLOL SUCCINATE ER 50 MG PO TB24
50.0000 mg | ORAL_TABLET | Freq: Every day | ORAL | Status: DC
  Administered 2021-08-01 – 2021-08-02 (×2): 50 mg via ORAL
  Filled 2021-08-01 (×2): qty 1

## 2021-08-01 MED ORDER — PANTOPRAZOLE SODIUM 40 MG PO TBEC
40.0000 mg | DELAYED_RELEASE_TABLET | Freq: Two times a day (BID) | ORAL | Status: DC
  Administered 2021-08-01 – 2021-08-02 (×3): 40 mg via ORAL
  Filled 2021-08-01 (×3): qty 1

## 2021-08-01 MED ORDER — OXYCODONE-ACETAMINOPHEN 5-325 MG PO TABS
1.0000 | ORAL_TABLET | Freq: Four times a day (QID) | ORAL | Status: DC | PRN
  Filled 2021-08-01: qty 1

## 2021-08-01 MED ORDER — IPRATROPIUM-ALBUTEROL 0.5-2.5 (3) MG/3ML IN SOLN
3.0000 mL | Freq: Four times a day (QID) | RESPIRATORY_TRACT | Status: DC
Start: 2021-08-02 — End: 2021-08-02
  Administered 2021-08-02: 3 mL via RESPIRATORY_TRACT
  Filled 2021-08-01: qty 3

## 2021-08-01 MED ORDER — ACETAMINOPHEN 325 MG PO TABS: 650.0000 mg | ORAL_TABLET | Freq: Four times a day (QID) | ORAL | Status: DC | PRN

## 2021-08-01 MED ORDER — OXYBUTYNIN CHLORIDE ER 10 MG PO TB24: 10.0000 mg | ORAL_TABLET | Freq: Every day | ORAL | Status: DC

## 2021-08-01 MED ORDER — TRAZODONE HCL 50 MG PO TABS: 50.0000 mg | ORAL_TABLET | Freq: Every evening | ORAL | Status: DC | PRN

## 2021-08-01 MED ORDER — WARFARIN SODIUM 10 MG PO TABS
10.0000 mg | ORAL_TABLET | Freq: Every day | ORAL | Status: DC
Start: 2021-08-01 — End: 2021-08-01

## 2021-08-01 MED ORDER — MORPHINE SULFATE (PF) 4 MG/ML IV SOLN
4.0000 mg | Freq: Once | INTRAVENOUS | Status: AC
  Administered 2021-08-01: 4 mg via INTRAVENOUS
  Filled 2021-08-01: qty 1

## 2021-08-01 MED ORDER — HYDRALAZINE HCL 20 MG/ML IJ SOLN
5.0000 mg | INTRAMUSCULAR | Status: DC | PRN
Start: 2021-08-01 — End: 2021-08-02

## 2021-08-01 MED ORDER — POTASSIUM CHLORIDE IN NACL 20-0.9 MEQ/L-% IV SOLN
INTRAVENOUS | Status: DC
  Filled 2021-08-01 (×3): qty 1000

## 2021-08-01 MED ORDER — HYDROCHLOROTHIAZIDE 25 MG PO TABS: 25.0000 mg | ORAL_TABLET | Freq: Every day | ORAL | Status: DC

## 2021-08-01 MED ORDER — HEPARIN (PORCINE) 25000 UT/250ML-% IV SOLN
1150.0000 [IU]/h | INTRAVENOUS | Status: DC
Start: 2021-08-01 — End: 2021-08-02
  Administered 2021-08-01: 950 [IU]/h via INTRAVENOUS
  Filled 2021-08-01: qty 250

## 2021-08-01 MED ORDER — CLOPIDOGREL BISULFATE 75 MG PO TABS
75.0000 mg | ORAL_TABLET | Freq: Every day | ORAL | Status: DC
  Administered 2021-08-01 – 2021-08-02 (×2): 75 mg via ORAL
  Filled 2021-08-01 (×2): qty 1

## 2021-08-01 MED ORDER — IPRATROPIUM-ALBUTEROL 0.5-2.5 (3) MG/3ML IN SOLN
3.0000 mL | Freq: Four times a day (QID) | RESPIRATORY_TRACT | Status: DC
Start: 2021-08-01 — End: 2021-08-01
  Administered 2021-08-01: 3 mL via RESPIRATORY_TRACT
  Filled 2021-08-01: qty 3

## 2021-08-01 MED ORDER — POTASSIUM CHLORIDE 20 MEQ PO PACK
40.0000 meq | PACK | Freq: Once | ORAL | Status: DC
  Filled 2021-08-01: qty 2

## 2021-08-01 MED ORDER — WARFARIN - PHYSICIAN DOSING INPATIENT
Freq: Every day | Status: DC
  Filled 2021-08-01: qty 1

## 2021-08-01 MED ORDER — MAGNESIUM OXIDE -MG SUPPLEMENT 400 (240 MG) MG PO TABS
400.0000 mg | ORAL_TABLET | Freq: Every day | ORAL | Status: DC
  Administered 2021-08-01 – 2021-08-02 (×2): 400 mg via ORAL
  Filled 2021-08-01 (×2): qty 1

## 2021-08-01 MED ORDER — INSULIN ASPART 100 UNIT/ML IJ SOLN: 0.0000 [IU] | Freq: Every day | INTRAMUSCULAR | Status: DC

## 2021-08-01 MED ORDER — MAGNESIUM HYDROXIDE 400 MG/5ML PO SUSP: 30.0000 mL | Freq: Every day | ORAL | Status: DC | PRN

## 2021-08-01 MED ORDER — ZOLPIDEM TARTRATE 5 MG PO TABS
10.0000 mg | ORAL_TABLET | Freq: Every day | ORAL | Status: DC
  Administered 2021-08-01: 10 mg via ORAL
  Filled 2021-08-01: qty 2

## 2021-08-01 MED ORDER — ACETAMINOPHEN 650 MG RE SUPP: 650.0000 mg | Freq: Four times a day (QID) | RECTAL | Status: DC | PRN

## 2021-08-01 MED ORDER — ATORVASTATIN CALCIUM 20 MG PO TABS
80.0000 mg | ORAL_TABLET | Freq: Every day | ORAL | Status: DC
  Administered 2021-08-01: 80 mg via ORAL
  Filled 2021-08-01: qty 4

## 2021-08-01 MED ORDER — INSULIN ASPART 100 UNIT/ML IJ SOLN: 0.0000 [IU] | Freq: Three times a day (TID) | INTRAMUSCULAR | Status: DC

## 2021-08-01 MED ORDER — WARFARIN - PHARMACIST DOSING INPATIENT
Freq: Every day | Status: DC
  Filled 2021-08-01: qty 1

## 2021-08-01 MED ORDER — MORPHINE SULFATE (PF) 2 MG/ML IV SOLN
2.0000 mg | INTRAVENOUS | Status: DC | PRN
  Administered 2021-08-01 – 2021-08-02 (×4): 2 mg via INTRAVENOUS
  Filled 2021-08-01 (×3): qty 1

## 2021-08-01 MED ORDER — FINASTERIDE 5 MG PO TABS
5.0000 mg | ORAL_TABLET | Freq: Every day | ORAL | Status: DC
  Administered 2021-08-01 – 2021-08-02 (×2): 5 mg via ORAL
  Filled 2021-08-01 (×2): qty 1

## 2021-08-01 MED ORDER — ASPIRIN 81 MG PO CHEW
324.0000 mg | CHEWABLE_TABLET | Freq: Once | ORAL | Status: AC
  Administered 2021-08-01: 324 mg via ORAL
  Filled 2021-08-01: qty 4

## 2021-08-01 MED ORDER — OXYCODONE HCL 5 MG PO TABS
5.0000 mg | ORAL_TABLET | Freq: Four times a day (QID) | ORAL | Status: DC | PRN
  Filled 2021-08-01 (×2): qty 1

## 2021-08-01 NOTE — Assessment & Plan Note (Signed)
Recent A1c 6.3, well controlled.  Patient is on metformin -Sliding scale insulin

## 2021-08-01 NOTE — Consult Note (Signed)
[facility] CLINIC CARDIOLOGY CONSULT NOTE       Patient ID: TAVONTE SEYBOLD MRN: [id number] DOB/AGE: [birth year] 64 y.o.  Admit date: 08/01/2021 Referring Physician Dr. Clyde Lundborg Primary Physician Dr. Docia Furl in [facility] Primary Cardiologist Dr. Clelia Croft [facility] - EP)  Reason for Consultation chest pain   HPI: Sherard Sutch is a 80GNF with a PMH of CAD with known jailed D1 s/p DES to LAD 11/2017 with restenosis 06/2018) DES to LAD and ramus 07/19/2018, DES to distal ramus 08/2018 restenosis of distal ramus s/p new DES 04/2019, DES mid RCA 04/28/2019, HF history of pulmonary sarcoid with restrictive CM, atrial fibrillation s/p multiple ablations, complete heart block  and ?history of VT s/p Saint Jude ICD with gen change out 01/2020 who presented to [facility] ED 07/24/2021 with chest pain.  Cardiology is consulted for further assistance.  Patient states he was seen by his electrophysiologist, Dr. Clelia Croft at [facility] in [place] yesterday morning for follow-up with his defibrillator.  He said it was interrogated and he is not sure if any changes were made, but was told to follow-up in 1 month.  He reportedly was just discharged after after a 1 week hospitalization at [facility] where the patient states he had an issue with his atrial lead not firing appropriately and he is still concerned about this..  He also tells me over the past month he had 4 heart catheterizations, the first 3 each showing progressive blockages, requiring ballooning of various stents, and reportedly has a history of 12 stents.  Unfortunately, I have no way to access these records from [facility],, other than some documentation of phone calls between his primary care Dr. Zollie Beckers and [facility] and Dr. Lucky Cowboy at [facility] requesting advice for antibiotics for prostatitis not interfering with in INR.   He tells me he drove in his car up to [facility] after his appointment, and around 7 PM he started having  intermittent chest pain, that progressed to 9 out of 10 chest pain while he was driving but was similar to his prior MIs in 2019.  He denies radiation of this discomfort, and is also tender along his anterior chest wall, and along his right defibrillator pocket.  Of note, he walked 5 [place] 3 days ago without any angina or this chest discomfort.  He also tells me his feet have been intermittently swollen, has a slight cough, sore throat, and middle back pain.  He also feels intermittent palpitations that bother him too.  In my time of evaluation, the patient was sleeping and awakens to voice to tell me he still has 9 out of 10 chest pain.  This got slightly better with morphine to an 8 out of 10 earlier today.  But he has not taken any other antianginal medicines on his own due to being in his car driving earlier yesterday evening.   Vitals are notable for a blood pressure of 127/82, SPO2 99% on room air, heart rate 74 in sinus rhythm on telemetry.  Labs are notable for hypokalemia 3.2, magnesium 2.3, creatinine 0.96, GFR 60.  BNP negative at 29.8, high-sensitivity troponin negative on 3 repeats at 10-13-09.  He is slightly anemic with hemoglobin 10.2/hematocrit 36.  INR subtherapeutic at 1.9.  His EKG shows sinus rhythm without evidence of acute ischemic changes.  Chest x-ray is negative for active cardiopulmonary disease.   Review of systems complete and found to be negative unless listed above     Past Medical History:  Diagnosis Date   Atrial fibrillation (HCC)    CHF (congestive heart failure) (HCC)    Diabetes mellitus without complication (HCC)    Factor 5 Leiden mutation, heterozygous (HCC)    Hypertension    Pacemaker     Past Surgical History:  Procedure Laterality Date   CARDIAC SURGERY     HERNIA REPAIR     TRANSURETHRAL RESECTION OF PROSTATE      (Not in a hospital admission)  Social History   Socioeconomic History   Marital status: Single    Spouse name: Not on file    Number of children: Not on file   Years of education: Not on file   Highest education level: Not on file  Occupational History   Not on file  Tobacco Use   Smoking status: Former   Smokeless tobacco: Never  Vaping Use   Vaping Use: Never used  Substance and Sexual Activity   Alcohol use: No   Drug use: No   Sexual activity: Not on file  Other Topics Concern   Not on file  Social History Narrative   Not on file   Social Determinants of Health   Financial Resource Strain: Not on file  Food Insecurity: Not on file  Transportation Needs: Not on file  Physical Activity: Not on file  Stress: Not on file  Social Connections: Not on file  Intimate Partner Violence: Not on file    Family History  Problem Relation Age of Onset   Diabetes Mother    CAD Mother    Hypertension Mother    CAD Father    Hyperlipidemia Father    Hypertension Father       PHYSICAL EXAM General: Black male, well nourished, in no acute distress.  Sleeping upon my entry into the room, wakes to voice and appears comfortable. HEENT:  Normocephalic and atraumatic. Neck:  No JVD.  Lungs: Normal respiratory effort on room air. Clear bilaterally to auscultation. No wheezes, crackles, rhonchi.  Heart: HRRR . Normal S1 and S2 without gallops or murmurs. Radial & DP pulses 2+ bilaterally. Chest: Mild tenderness palpation of the anterior chest wall, tenderness  over his right defibrillator pocket site. Abdomen: Non-distended appearing.  Msk: Normal strength and tone for age. Extremities: Warm and well perfused. No clubbing, cyanosis.  No peripheral edema.  Neuro: Alert and oriented X 3. Psych:  Answers questions appropriately.   Labs:   Lab Results  Component Value Date   WBC 6.7 08/01/2021   HGB 10.2 (L) 08/01/2021   HCT 36.0 (L) 08/01/2021   MCV 73.9 (L) 08/01/2021   PLT 378 08/01/2021    Recent Labs  Lab 08/01/21 0103  NA 140  K 3.2*  CL 108  CO2 24  BUN 12  CREATININE 0.96  CALCIUM 9.3   GLUCOSE 150*   Lab Results  Component Value Date   CKTOTAL 338 (H) 09/09/2012   CKMB 2.7 09/09/2012   TROPONINI <0.03 11/03/2016    Lab Results  Component Value Date   CHOL 126 08/01/2021   CHOL 140 09/10/2012   CHOL 127 08/24/2011   Lab Results  Component Value Date   HDL 34 (L) 08/01/2021   HDL 21 (L) 09/10/2012   HDL 20 (L) 08/24/2011   Lab Results  Component Value Date   LDLCALC 65 08/01/2021   LDLCALC 63 09/10/2012   LDLCALC 47 08/24/2011   Lab Results  Component Value Date   TRIG 137 08/01/2021   TRIG 281 (H) 09/10/2012  TRIG 298 (H) 08/24/2011   Lab Results  Component Value Date   CHOLHDL 3.7 08/01/2021   No results found for: "LDLDIRECT"    Radiology: DG Chest 2 View  Result Date: 08/01/2021 CLINICAL DATA:  Chest pain EXAM: CHEST - 2 VIEW COMPARISON:  11/03/2016 FINDINGS: Right AICD remains in place, unchanged. Heart and mediastinal contours are within normal limits. No focal opacities or effusions. No acute bony abnormality. IMPRESSION: No active cardiopulmonary disease. Electronically Signed   By: Charlett Nose M.D.   On: 08/01/2021 01:21    12/26/2019 - [facility] 11/22 Diagnostic Findings Coronary arteries Dominance: right Left main: normal LAD: 1st diagonal 50% - large, previous stent; 2nd diagonal 90% - large LCx: normal RCA: insignificant  Impressions Single vessel coronary artery disease. Patent LAD and RCA stents D1 stent has ISR but not severe and should not cause rest angina  ECHO 12/26/2019 ECHOCARDIOGRAPHIC MEASUREMENTS -----------------------------------------------  2D DIMENSIONS  AORTA          Values     Normal RangeMAIN PA      Values     Normal Range       Annulus:  nm*  cm    [2 - 3.2]      PA Main:   2.1 cm    [1.5 - 2.1]     Aorta Sin:   2.7 cm    [2.8 - 4]   RIGHT VENTRICLE   ST Junction:  nm*  cm    [2.3 - 3.5]    RV Base:   4.4 cm    [2.5 - 4.1]     Asc.Aorta:   2.9 cm    [2.2 - 3.8]     RV Mid:   3.7 cm    [1.9 - 3.5]   LEFT VENTRICLE                         RV Length:  nm*  cm    [  ]         LVIDd:   4.3 cm    [4.2 - 5.8] RIGHT ATRIUM         LVIDs:   2.3 cm    [2.4 - 4]      RA Area:  12   cm2   [ <= 20]        LVEDVi:  88.0 ml/m2 [34 - 74]         RAVi:  15   ml/m2 [11 - 39]        LVESVi:  43.0 ml/m2 [11 - 31]   INFERIOR VENA CAVA            FS:  47   %     [ >= 25]       Max.IVC:   1.3 cm    [ <= 2.1]           SWT:   1.1 cm    [0.6 - 1]      Min.IVC:  nm*  cm    [ <= 1.7]           PWT:   1.2 cm    [0.6 - 1]   __________________  LEFT ATRIUM                           nm* - not measured       LA Diam:   4.1 cm    [  3 - 4]       LA Area:  22   cm2   [ <= 20]     LA Volume:  67   mL    [18 - 58]          LAVi:  34   ml/m2 [16 - 34]   ECHOCARDIOGRAPHIC DESCRIPTIONS -----------------------------------------------  AORTIC ROOT          Size: Normal    Dissection: INDETERM FOR DISSECTION   AORTIC VALVE      Leaflets: Tricuspid             Morphology: Normal      Mobility: Fully Mobile   LEFT VENTRICLE                                      Anterior: HYPOCONTRACTILE          Size: Normal                                 Lateral: Normal   Contraction: REGIONALLY IMPAIRED                     Septal: Normal    Closest EF: 50% (Estimated)  Calc.EF: 51% (3D)      Apical: Normal     LV masses: No Masses                             Inferior: Normal           LVH: MILD LVH CONCENTRIC                  Posterior: Normal   LV GLS(AVG): -14.5% Normal Range [ <= -16]  Dias.FxClass: INDETERMINATE   MITRAL VALVE      Leaflets: Normal                  Mobility: Fully mobile    Morphology: Normal   LEFT ATRIUM          Size: Normal     LA masses: No masses                Normal IAS   MAIN PA          Size: Normal   PULMONIC VALVE    Morphology: Not seen      Mobility: Not seen   RIGHT VENTRICLE          Size: MILDLY ENLARGED           Free wall: Normal   Contraction: Normal                    RV  masses: CATHETER IN RV         TAPSE:   2.4 cm,  Normal Range [>= 1.6 cm]       RV Note: RV S' = 11 cm/sec   TRICUSPID VALVE      Leaflets: Normal                  Mobility: Fully mobile    Morphology: Normal   RIGHT ATRIUM          Size: Normal  RA Other: None     RA masses: CATHETER IN RA   PERICARDIUM         Fluid: No effusion   INFERIOR VENACAVA          Size: Normal     ABNORMAL RESPIRATORY COLLAPSE   DOPPLER ECHO and OTHER SPECIAL PROCEDURES ------------------------------------     Aortic: No AR                  No AS      Mitral: MILD MR                No MS     MV Inflow E Vel.= 109.0 cm/s  MV Annulus E'Vel.= 8.5 cm/s  E/E'Ratio= 13   Tricuspid: TRIVIAL TR             No TS             2.4 m/s peak TR vel   31 mmHg peak RV pressure   Pulmonary: TRIVIAL PR             No PS       Other:   INTERPRETATION ---------------------------------------------------------------    MILD LV DYSFUNCTION (See above) WITH MILD LVH    NORMAL RIGHT VENTRICULAR SYSTOLIC FUNCTION    VALVULAR REGURGITATION: MILD MR, TRIVIAL PR, TRIVIAL TR    NO VALVULAR STENOSIS    3D acquisition and reconstructions were performed as part of this    examination to more accurately quantify the effects of reduced left    ventricular ejection fraction. (post-processing on an Independent    workstation).   TELEMETRY reviewed by me: Sinus rhythm rate 70s  EKG reviewed by me: sinus rhythm rate 64 with history of old lateral infarct  ASSESSMENT AND PLAN:  Keene Gilkey is a 52yoM with a PMH of CAD with known jailed D1 s/p DES to LAD 11/2017 with restenosis 06/2018) DES to LAD and ramus 07/19/2018, DES to distal ramus 08/2018 restenosis of distal ramus s/p new DES 04/2019, DES mid RCA 04/28/2019, HF history of pulmonary sarcoid with restrictive CM, atrial fibrillation s/p multiple ablations, complete heart block  and ?history of VT s/p Saint Jude ICD with gen change out 01/2020 who presented to  [facility] ED 07/24/2021 with chest pain.  Cardiology is consulted for further assistance.  #Atypical chest pain #CAD with reported history of 12 stents. #History of VT and restrictive cardiomyopathy with pulmonary sarcoid s/p Saint Jude ICD The patient presents with reported 9 out of 10 chest pain that occurred while he was driving his car from [place] to [employer] overnight, did not take any antianginals, and was only slightly relieved by morphine from a 9 out of 10 to 8 out of 10 per his reporting.  He also tells me he had his defibrillator interrogated by his electrophysiologist at [facility] yesterday morning and he felt well, even walking 5 [place] on Monday without angina or other difficulty.  He also thinks one of his atrial leads is not firing appropriately, per his he thinks this discomfort is similar to what he had prior to his heart attacks in the past, but thankfully his troponins are negative on multiple repeats, and EKG is without acute ischemic changes.  He tells me he had 4 heart catheterizations within the past month at [facility], all requiring ballooning of his previously placed stents, however I have no way to access these records unfortunately.,  He has multiple other complaints of a cough, sore throat, back  pain, but is clinically well-appearing this morning,  Actually sleeping upon my arrival to the room despite having 9 out of 10 chest pain.  His pain seems somewhat out of proportion to his physical exam and lab findings, but does state he sometimes does have chest pain when his INR is subtheraputic. -[facility] -S/p 325 mg aspirin, continue clopidogrel 75 mg monotherpay -Continue atorvastatin 80 mg daily -Antianginal therapy beta-blocker with metoprolol XL 50 mg -He has allergies to nitroglycerin, Ranexa, and multiple other opioids. -echocardiogram complete  -We will defer invasive evaluation today with his recent history of multiple LHCs  ofer the past month, but will consider this pending clinical course  #Paroxysmal atrial fibrillation s/p multiple ablations, on warfarin #Factor V Leiden Currently in sinus rhythm on telemetry, his last dose of warfarin was yesterday morning, current INR 1.9. Will continue his warfarin at 7.5mg  today and start heparin gtt to see if his chest pain improves as above.  -goal INR 2.5  This patient's plan of care was discussed and created with Dr. Darrold JunkerParaschos and he is in agreement.  Signed: Rebeca AllegraLily Michelle Cash Duce , PA-C 08/01/2021, 9:28 AM [facility]

## 2021-08-01 NOTE — Assessment & Plan Note (Signed)
Lipitor 

## 2021-08-01 NOTE — Progress Notes (Signed)
Admission profile updated. ?

## 2021-08-01 NOTE — Consult Note (Signed)
ANTICOAGULATION CONSULT NOTE  Pharmacy Consult for warfarin Indication: atrial fibrillation  Allergies  Allergen Reactions   Celecoxib Other (See Comments)    Other reaction(s): GI Bleed, Other - See Comments GI BLEEDING Other Reaction: GI BLEEDING GI bleeding    Ciprofloxacin Anaphylaxis and Other (See Comments)    Other reaction(s): Other - See Comments Pt went into At Fib Other Reaction: PUT PT IN A FIB    Hydromorphone Nausea And Vomiting    Other reaction(s): Vomiting Nausea and vomiting    Oxycodone-Acetaminophen Nausea And Vomiting    Other reaction(s): Vomiting   Ranolazine Anaphylaxis    Patient describes abdominal swelling/bloating that radiates to neck/throat Patient describes abdominal swelling/bloating that radiates to neck/throat    Rivaroxaban Rash    Other reaction(s): Other (See Comments), Other - See Comments SEVERE RECTAL BLEED Sever rectal bleeding Sever rectal bleeding SEVERE RECTAL BLEED    Ezetimibe Diarrhea    Other reaction(s): cough, Other - See Comments Cough Cough    Apixaban    Dabigatran Etexilate Mesilate [Dabigatran]    Dilaudid [Hydromorphone Hcl]    Nitroglycerin    Prednisone    Tramadol     Patient Measurements: Height: 5' 4.02" (162.6 cm) Weight: 88 kg (194 lb) IBW/kg (Calculated) : 59.24 Heparin Dosing Weight: 88kg  Vital Signs: Temp: 98.4 F (36.9 C) (06/29 2213) BP: 100/72 (06/29 2213) Pulse Rate: 63 (06/29 2213)  Labs: Recent Labs    08/01/21 0103 08/01/21 0401 08/01/21 0834 08/01/21 1540 08/01/21 2222  HGB 10.2*  --   --   --   --   HCT 36.0*  --   --   --   --   PLT 378  --   --   --   --   LABPROT  --  21.9*  --   --   --   INR  --  1.9*  --   --   --   HEPARINUNFRC  --   --   --   --  0.33  CREATININE 0.96  --   --   --   --   TROPONINIHS 9 9 11 8 9      Estimated Creatinine Clearance: 77.7 mL/min (by C-G formula based on SCr of 0.96 mg/dL).   Medical History: Past Medical History:   Diagnosis Date   Atrial fibrillation (HCC)    CHF (congestive heart failure) (HCC)    Diabetes mellitus without complication (HCC)    Factor 5 Leiden mutation, heterozygous (HCC)    Hypertension    Pacemaker     Medications:  PTA: Warfarin 7.5mg  every morning Last dose 6/28 in the morning Inpatient: Allergies: Apixaban (reaction not documented)  Assessment: [name] is a 66 y.o. male with history of atrial fibrillation on Coumadin, hypertension, diabetes, CHF that is post pacemaker, CAD status post 12 stents who presents to the emergency department with complaints of diffuse anterior chest pain.  Pharmacy consulted for inpatient management of warfarin and heparin in the setting of Atrial Fibrillation with possible cardiac procedure.  Goal of Therapy:  INR 2-3 Monitor platelets by anticoagulation protocol: Yes   Plan:  Warfarin - INR 1.9 this morning. Will give 10mg  dose at 1600. Check INR daily with AM labs  Heparin - HL 0.33 @ 2222, therapeutic x 1 - Continue heparin infusion at 950 units/hr - Recheck HL w/ AM labs to confirm - CBC daily  , PharmD, [facility] 08/01/2021 11:31 PM

## 2021-08-01 NOTE — Assessment & Plan Note (Signed)
-  see above 

## 2021-08-01 NOTE — ED Triage Notes (Addendum)
Pt arrives with c/o chest pain that started about an hour ago. Pt endorses SOB. Per pt, the pain radiates into his shoulder. Per pt, he had his settings changed on his PPM this morning.

## 2021-08-01 NOTE — Assessment & Plan Note (Addendum)
Chest pain and hx of CAD: s/p of 12 stent placement per pt. Trop  9 --> 9. Consulted Dr. Darrold Junker of cardiology  - place to tele bed for observation - Trend Trop - pt is allergic to Nitroglycerin, - prn Morphine - lipitor  - Patient received 324 mg of aspirin, will continue ASA 81 mg daily - Patient is on Plavix - Risk factor stratification: will check FLP and A1C  - check UDS - 2d echo - IV heparin per cardiac recommendations

## 2021-08-01 NOTE — Progress Notes (Signed)
Pt's BG was 66, pt refusing juice.  States he has his own regular sodas and requesting a sandwich tray.  Provided.  Pt states he is not a diabetic.  Will recheck BG in 15 minutes.

## 2021-08-01 NOTE — ED Notes (Signed)
Pt requesting potassium packet be changed to pill. MD paged.

## 2021-08-01 NOTE — Assessment & Plan Note (Signed)
2D echo on 12/26/2019 showed EF of 51%.  Patient does not have leg edema.  No JVD.  CHF is compensated. -Watch volume status closely

## 2021-08-01 NOTE — Assessment & Plan Note (Addendum)
Pt is on coumadin with INR 1.9, slightly subtherapeutic   heart rate 50-60s -continue coumadin -start IV heparin per current recommendations -Continue metoprolol 50 mg daily

## 2021-08-01 NOTE — ED Provider Notes (Signed)
[facility] Provider Note    Event Date/Time   First MD Initiated Contact with Patient 08/01/21 0402     (approximate)   History   Chest Pain   HPI  [name] is a 66 y.o. male with history of atrial fibrillation on Coumadin, hypertension, diabetes, CHF that is post pacemaker, CAD status post 12 stents who presents to the emergency department with complaints of diffuse anterior chest pain.  Patient has a hard time describing the pain or what makes it better or worse.  Has associated shortness of breath, lightheadedness.  No nausea or vomiting.  Reports mild dry cough.  No fever.  Feels like he is having swelling in both feet which is abnormal for him.  States that he lives in [place] and was seen by his electrophysiologist today at [facility] and he had his pacemaker adjusted.  He states since that time he has had chest pain.  He is here visiting family and [place].  No history of PE or DVT.  He is unable to tell me if this feels like his anginal equivalent.   History provided by patient.    Past Medical History:  Diagnosis Date   Atrial fibrillation (HCC)    CHF (congestive heart failure) (HCC)    Diabetes mellitus without complication (HCC)    Factor 5 Leiden mutation, heterozygous (HCC)    Hypertension    Pacemaker     Past Surgical History:  Procedure Laterality Date   CARDIAC SURGERY     HERNIA REPAIR     TRANSURETHRAL RESECTION OF PROSTATE      MEDICATIONS:  Prior to Admission medications   Medication Sig Start Date End Date Taking? Authorizing Provider  hydrochlorothiazide (HYDRODIURIL) 25 MG tablet Take 25 mg by mouth daily.    [provider]  LOVASTATIN PO Take by mouth daily.    [provider]  metFORMIN (GLUCOPHAGE) 500 MG tablet Take by mouth 2 (two) times daily with a meal.    [provider]  metoprolol tartrate (LOPRESSOR) 100 MG tablet Take 100 mg by mouth daily.    [provider]   nicotine polacrilex (NICORETTE) 4 MG gum Take 4 mg by mouth as needed for smoking cessation.    [provider]  oxybutynin (DITROPAN-XL) 10 MG 24 hr tablet Take 10 mg by mouth at bedtime.    [provider]  oxyCODONE-acetaminophen (PERCOCET) 10-325 MG tablet Take 1 tablet by mouth every 6 (six) hours as needed for pain.    [provider]  pantoprazole (PROTONIX) 20 MG tablet Take 20 mg by mouth 2 (two) times daily.    [provider]  warfarin (COUMADIN) 10 MG tablet Take 10 mg by mouth daily.    [provider]  zolpidem (AMBIEN) 10 MG tablet Take 10 mg by mouth at bedtime.    [provider]    Physical Exam   Triage Vital Signs: ED Triage Vitals  Enc Vitals Group     BP 08/01/21 0059 127/78     Pulse Rate 08/01/21 0059 79     Resp 08/01/21 0059 20     Temp 08/01/21 0059 98.4 F (36.9 C)     Temp Source 08/01/21 0059 Oral     SpO2 08/01/21 0059 100 %     Weight 08/01/21 0059 194 lb (88 kg)     Height --      Head Circumference --      Peak Flow --  Pain Score 08/01/21 0059 9     Pain Loc --      Pain Edu? --      Excl. in GC? --     Most recent vital signs: Vitals:   08/01/21 0059 08/01/21 0414  BP: 127/78 130/65  Pulse: 79 (!) 55  Resp: 20 18  Temp: 98.4 F (36.9 C)   SpO2: 100% 97%    CONSTITUTIONAL: Alert and oriented and responds appropriately to questions. Well-appearing; well-nourished HEAD: Normocephalic, atraumatic EYES: Conjunctivae clear, pupils appear equal, sclera nonicteric ENT: normal nose; moist mucous membranes NECK: Supple, normal ROM CARD: RRR; S1 and S2 appreciated; no murmurs, no clicks, no rubs, no gallops RESP: Normal chest excursion without splinting or tachypnea; breath sounds clear and equal bilaterally; no wheezes, no rhonchi, no rales, no hypoxia or respiratory distress, speaking full sentences ABD/GI: Normal bowel sounds; non-distended; soft, non-tender, no rebound, no  guarding, no peritoneal signs BACK: The back appears normal EXT: Normal ROM in all joints; no deformity noted, no edema; no cyanosis, no calf tenderness or calf swelling SKIN: Normal color for age and race; warm; no rash on exposed skin NEURO: Moves all extremities equally, normal speech PSYCH: The patient's mood and manner are appropriate.   ED Results / Procedures / Treatments   LABS: (all labs ordered are listed, but only abnormal results are displayed) Labs Reviewed  BASIC METABOLIC PANEL - Abnormal; Notable for the following components:      Result Value   Potassium 3.2 (*)    Glucose, Bld 150 (*)    All other components within normal limits  CBC - Abnormal; Notable for the following components:   Hemoglobin 10.2 (*)    HCT 36.0 (*)    MCV 73.9 (*)    MCH 20.9 (*)    MCHC 28.3 (*)    RDW 17.6 (*)    All other components within normal limits  PROTIME-INR - Abnormal; Notable for the following components:   Prothrombin Time 21.9 (*)    INR 1.9 (*)    All other components within normal limits  BRAIN NATRIURETIC PEPTIDE  TROPONIN I (HIGH SENSITIVITY)  TROPONIN I (HIGH SENSITIVITY)     EKG:  EKG Interpretation  Date/Time:  Thursday August 01 2021 01:02:11 EDT Ventricular Rate:  73 PR Interval:  168 QRS Duration: 92 QT Interval:  400 QTC Calculation: 440 R Axis:   31 Text Interpretation: Sinus rhythm with occasional Premature ventricular complexes Low voltage QRS Lateral infarct , age undetermined Abnormal ECG When compared with ECG of 03-Nov-2016 16:50, Premature ventricular complexes are now Present Lateral infarct is now Present Confirmed by Rochele Raring [phone]) on 08/01/2021 4:03:48 AM         RADIOLOGY: My personal review and interpretation of imaging: Chest x-ray clear.  I have personally reviewed all radiology reports.   DG Chest 2 View  Result Date: 08/01/2021 CLINICAL DATA:  Chest pain EXAM: CHEST - 2 VIEW COMPARISON:  11/03/2016 FINDINGS: Right AICD  remains in place, unchanged. Heart and mediastinal contours are within normal limits. No focal opacities or effusions. No acute bony abnormality. IMPRESSION: No active cardiopulmonary disease. Electronically Signed   By: Charlett Nose M.D.   On: 08/01/2021 01:21     PROCEDURES:  Critical Care performed: No      .1-3 Lead EKG Interpretation  Performed by: Tashanti Dalporto, Layla Maw, DO Authorized by: Caily Rakers, Layla Maw, DO     Interpretation: normal     ECG rate:  55  ECG rate assessment: bradycardic     Rhythm: sinus bradycardia     Ectopy: PVCs     Conduction: normal       IMPRESSION / MDM / ASSESSMENT AND PLAN / ED COURSE  I reviewed the triage vital signs and the nursing notes.    Patient here with complaints of chest pain, shortness of breath.  History of significant cardiac disease with multiple stents, A-fib on Coumadin, CHF with pacemaker.  The patient is on the cardiac monitor to evaluate for evidence of arrhythmia and/or significant heart rate changes.   DIFFERENTIAL DIAGNOSIS (includes but not limited to):   ACS, PE, dissection, CHF, pneumonia, pneumothorax, musculoskeletal pain   Patient's presentation is most consistent with acute presentation with potential threat to life or bodily function.   PLAN: We will obtain EKG, CBC, troponin x2, BNP, BMP, chest x-ray.  Will give aspirin.  Patient reports allergy to nitroglycerin.  Will give morphine.  We will interrogate his pacemaker.   MEDICATIONS GIVEN IN ED: Medications  aspirin chewable tablet 324 mg (324 mg Oral Given 08/01/21 0457)  morphine (PF) 4 MG/ML injection 4 mg (4 mg Intravenous Given 08/01/21 0454)  ondansetron (ZOFRAN) injection 4 mg (4 mg Intravenous Given 08/01/21 0454)     ED COURSE: Patient's labs show anemia which appears chronic and stable.  No electrolyte derangement.  Troponin x2 negative.  INR is 1.9.  Chest x-ray reviewed and interpreted by myself radiologist and shows no acute abnormality.  Discussed  with patient that his work-up is reassuring today however he is still having pain and is requesting admission to the hospital.   CONSULTS:  Consulted and discussed patient's case with hospitalist, Dr. Arville Care.  Hospitalist to place admission orders.  Patient (and family if present) agree with this plan.   I reviewed all nursing notes, vitals, pertinent previous records.  All labs, EKGs, imaging ordered have been independently reviewed and interpreted by myself.    OUTSIDE RECORDS REVIEWED: Reviewed patient's recent notes from [place] in [place].  There are no notes from [facility] unfortunately.       FINAL CLINICAL IMPRESSION(S) / ED DIAGNOSES   Final diagnoses:  Nonspecific chest pain     Rx / DC Orders   ED Discharge Orders     None        Note:  This document was prepared using Dragon voice recognition software and may include unintentional dictation errors.   Cung Masterson, Layla Maw, DO 08/01/21 [phone]

## 2021-08-01 NOTE — Assessment & Plan Note (Signed)
-   IV hydralazine as needed -Amlodipine, lisinopril, Metoprolol

## 2021-08-01 NOTE — Assessment & Plan Note (Signed)
K 3.2. -Repleted potassium -Check a magnesium level --> 2.3

## 2021-08-01 NOTE — Consult Note (Addendum)
ANTICOAGULATION CONSULT NOTE - Initial Consult  Pharmacy Consult for warfarin Indication: atrial fibrillation  Allergies  Allergen Reactions   Apixaban    Celecoxib    Ciprofloxacin    Dabigatran Etexilate Mesilate [Dabigatran]    Dilaudid [Hydromorphone Hcl]    Nitroglycerin    Prednisone    Tramadol     Patient Measurements: Weight: 88 kg (194 lb) Heparin Dosing Weight: 88kg  Vital Signs: Temp: 98.4 F (36.9 C) (06/29 0059) Temp Source: Oral (06/29 0059) BP: 116/63 (06/29 0700) Pulse Rate: 66 (06/29 0700)  Labs: Recent Labs    08/01/21 0103 08/01/21 0401  HGB 10.2*  --   HCT 36.0*  --   PLT 378  --   LABPROT  --  21.9*  INR  --  1.9*  CREATININE 0.96  --   TROPONINIHS 9 9    CrCl cannot be calculated (Unknown ideal weight.).   Medical History: Past Medical History:  Diagnosis Date   Atrial fibrillation (HCC)    CHF (congestive heart failure) (HCC)    Diabetes mellitus without complication (HCC)    Factor 5 Leiden mutation, heterozygous (HCC)    Hypertension    Pacemaker     Medications:  PTA: Warfarin 7.5mg  every morning Last dose 6/28 in the morning Inpatient: Allergies: Apixaban (reaction not documented)  Assessment: [name] is a 66 y.o. male with history of atrial fibrillation on Coumadin, hypertension, diabetes, CHF that is post pacemaker, CAD status post 12 stents who presents to the emergency department with complaints of diffuse anterior chest pain.  Pharmacy consulted for inpatient management of warfarin and heparin in the setting of Atrial Fibrillation with possible cardiac procedure.  Goal of Therapy:  INR 2-3 Monitor platelets by anticoagulation protocol: Yes   Plan:  Warfarin - INR 1.9 this morning. Will give 10mg  dose at 1600. Check INR daily with AM labs  Heparin - Bolus 4000 units x 1 - heparin continuous infusion 950 units/hr - check HL in 6 hours and daily while on heparin - CBC daily  08/01/2021,7:31 AM

## 2021-08-01 NOTE — H&P (Addendum)
History and Physical    [name]:[id number] DOB: 04/06/1955 DOA: 08/01/2021  Referring MD/NP/PA:   PCP: Almon Register, MD   Patient coming from:  The patient is coming from home.  At baseline, pt is independent for most of ADL.        Chief Complaint: chest pain  HPI: [name] is a 66 y.o. male with medical history significant of A fib on Coumadin, hypertension, diabetes, dCHF, HLD, s/p of pacemaker, CAD, status post 12 stents, Factor 5 Leiden mutation, heterozygous, who presents with chest pain.  Patient states that his chest pain started last night, which is located in the substernal area and also on the left side of the chest, 9 out of 10 in severity, aching, intermittent, nonradiating.  Associated with mild shortness breath. Patient has mild dry cough, no fever or chills. It is not pleuritic, not aggravated by deep breath.  No nausea vomiting, diarrhea or abdominal pain.  No symptoms of UTI.  Data Reviewed and ED Course: pt was found to have INR 1.9, troponin 9 --> 9, WBC 6.7, GFR > 60, potassium 3.2, temperature normal, blood pressure 116/63, heart rate 55, RR 20, oxygen saturation 100% on room air.  Chest x-ray negative.  Dr. Darrold Junker of cardiology is consulted.   EKG: I have personally reviewed.  Sinus rhythm, QTc 428, borderline LAD, artificial effects.   Review of Systems:   General: no fevers, chills, no body weight gain, has fatigue HEENT: no blurry vision, hearing changes or sore throat Respiratory: has dyspnea, coughing, no wheezing CV: has chest pain, no palpitations GI: no nausea, vomiting, abdominal pain, diarrhea, constipation GU: no dysuria, burning on urination, increased urinary frequency, hematuria  Ext: no leg edema Neuro: no unilateral weakness, numbness, or tingling, no vision change or hearing loss Skin: no rash, no skin tear. MSK: No muscle spasm, no deformity, no limitation of range of movement in spin Heme: No easy bruising.  Travel  history: No recent long distant travel.   Allergy:  Allergies  Allergen Reactions   Celecoxib Other (See Comments)    Other reaction(s): GI Bleed, Other - See Comments GI BLEEDING Other Reaction: GI BLEEDING GI bleeding    Ciprofloxacin Anaphylaxis and Other (See Comments)    Other reaction(s): Other - See Comments Pt went into At Fib Other Reaction: PUT PT IN A FIB    Hydromorphone Nausea And Vomiting    Other reaction(s): Vomiting Nausea and vomiting    Oxycodone-Acetaminophen Nausea And Vomiting    Other reaction(s): Vomiting   Ranolazine Anaphylaxis    Patient describes abdominal swelling/bloating that radiates to neck/throat Patient describes abdominal swelling/bloating that radiates to neck/throat    Rivaroxaban Rash    Other reaction(s): Other (See Comments), Other - See Comments SEVERE RECTAL BLEED Sever rectal bleeding Sever rectal bleeding SEVERE RECTAL BLEED    Ezetimibe Diarrhea    Other reaction(s): cough, Other - See Comments Cough Cough    Apixaban    Dabigatran Etexilate Mesilate [Dabigatran]    Dilaudid [Hydromorphone Hcl]    Nitroglycerin    Prednisone    Tramadol     Past Medical History:  Diagnosis Date   Atrial fibrillation (HCC)    CHF (congestive heart failure) (HCC)    Diabetes mellitus without complication (HCC)    Factor 5 Leiden mutation, heterozygous (HCC)    Hypertension    Pacemaker     Past Surgical History:  Procedure Laterality Date   CARDIAC SURGERY  HERNIA REPAIR     TRANSURETHRAL RESECTION OF PROSTATE      Social History:  reports that he has quit smoking. He has never used smokeless tobacco. He reports that he does not drink alcohol and does not use drugs.  Family History:  Family History  Problem Relation Age of Onset   Diabetes Mother    CAD Mother    Hypertension Mother    CAD Father    Hyperlipidemia Father    Hypertension Father      Prior to Admission medications   Medication Sig Start Date End  Date Taking? Authorizing Provider  albuterol (VENTOLIN HFA) 108 (90 Base) MCG/ACT inhaler Inhale 2 puffs into the lungs every 4 (four) hours as needed. 05/09/21   [provider]  amLODipine (NORVASC) 5 MG tablet Take 5 mg by mouth 2 (two) times daily. 07/26/21   [provider]  amoxicillin (AMOXIL) 500 MG capsule take 1 capsule in the morning and 1 capsule at noon and 1 capsule before bedtime by mouth. 04/01/21   [provider]  clopidogrel (PLAVIX) 75 MG tablet Take 75 mg by mouth daily. 04/29/19   [provider]  hydrochlorothiazide (HYDRODIURIL) 25 MG tablet Take 25 mg by mouth daily.    [provider]  LOVASTATIN PO Take by mouth daily.    [provider]  metFORMIN (GLUCOPHAGE) 500 MG tablet Take by mouth 2 (two) times daily with a meal.    [provider]  metoprolol tartrate (LOPRESSOR) 100 MG tablet Take 100 mg by mouth daily.    [provider]  nicotine polacrilex (NICORETTE) 4 MG gum Take 4 mg by mouth as needed for smoking cessation.    [provider]  oxybutynin (DITROPAN-XL) 10 MG 24 hr tablet Take 10 mg by mouth at bedtime.    [provider]  oxyCODONE-acetaminophen (PERCOCET) 10-325 MG tablet Take 1 tablet by mouth every 6 (six) hours as needed for pain.    [provider]  pantoprazole (PROTONIX) 20 MG tablet Take 20 mg by mouth 2 (two) times daily.    [provider]  warfarin (COUMADIN) 10 MG tablet Take 10 mg by mouth daily.    [provider]  zolpidem (AMBIEN) 10 MG tablet Take 10 mg by mouth at bedtime.    [provider]    Physical Exam: Vitals:   08/01/21 1100 08/01/21 1200 08/01/21 1300 08/01/21 1303  BP: 133/83  (!) 105/54   Pulse:   65 70  Resp: 11  (!) 9 13  Temp:      TempSrc:      SpO2: 100%  94% 93%  Weight:      Height:  5' 4.02" (1.626 m)     General: Not in acute distress HEENT:       Eyes: PERRL, EOMI, no scleral icterus.        ENT: No discharge from the ears and nose, no pharynx injection, no tonsillar enlargement.        Neck: No JVD, no bruit, no mass felt. Heme: No neck lymph node enlargement. Cardiac: S1/S2, RRR, No murmurs, No gallops or rubs. Respiratory: No rales, wheezing, rhonchi or rubs. GI: Soft, nondistended, nontender, no rebound pain, no organomegaly, BS present. GU: No hematuria Ext: No pitting leg edema bilaterally. 1+DP/PT pulse bilaterally. Musculoskeletal: No joint deformities, No joint redness or warmth, no limitation of ROM in spin. Skin: No rashes.  Neuro: Alert, oriented X3, cranial nerves II-XII grossly intact, moves all  extremities normally.  Psych: Patient is not psychotic, no suicidal or hemocidal ideation.  Labs on Admission: I have personally reviewed following labs and imaging studies  CBC: Recent Labs  Lab 08/01/21 0103  WBC 6.7  HGB 10.2*  HCT 36.0*  MCV 73.9*  PLT 378   Basic Metabolic Panel: Recent Labs  Lab 08/01/21 0103 08/01/21 0401  NA 140  --   K 3.2*  --   CL 108  --   CO2 24  --   GLUCOSE 150*  --   BUN 12  --   CREATININE 0.96  --   CALCIUM 9.3  --   MG  --  2.3   GFR: Estimated Creatinine Clearance: 77.7 mL/min (by C-G formula based on SCr of 0.96 mg/dL). Liver Function Tests: No results for input(s): "AST", "ALT", "ALKPHOS", "BILITOT", "PROT", "ALBUMIN" in the last 168 hours. No results for input(s): "LIPASE", "AMYLASE" in the last 168 hours. No results for input(s): "AMMONIA" in the last 168 hours. Coagulation Profile: Recent Labs  Lab 08/01/21 0401  INR 1.9*   Cardiac Enzymes: No results for input(s): "CKTOTAL", "CKMB", "CKMBINDEX", "TROPONINI" in the last 168 hours. BNP (last 3 results) No results for input(s): "PROBNP" in the last 8760 hours. HbA1C: No results for input(s): "HGBA1C" in the last 72 hours. CBG: Recent Labs  Lab 08/01/21 0759  GLUCAP 87   Lipid Profile: Recent Labs    08/01/21 0401  CHOL 126  HDL 34*   LDLCALC 65  TRIG 137  CHOLHDL 3.7   Thyroid Function Tests: No results for input(s): "TSH", "T4TOTAL", "FREET4", "T3FREE", "THYROIDAB" in the last 72 hours. Anemia Panel: No results for input(s): "VITAMINB12", "FOLATE", "FERRITIN", "TIBC", "IRON", "RETICCTPCT" in the last 72 hours. Urine analysis:    Component Value Date/Time   COLORURINE Colorless 06/25/2012 1147   COLORURINE YELLOW 05/27/2007 1525   APPEARANCEUR Clear 06/25/2012 1147   LABSPEC 1.002 06/25/2012 1147   PHURINE 7.0 06/25/2012 1147   PHURINE 6.0 05/27/2007 1525   GLUCOSEU Negative 06/25/2012 1147   HGBUR Negative 06/25/2012 1147   HGBUR NEGATIVE 05/27/2007 1525   BILIRUBINUR Negative 06/25/2012 1147   KETONESUR Negative 06/25/2012 1147   KETONESUR NEGATIVE 05/27/2007 1525   PROTEINUR Negative 06/25/2012 1147   PROTEINUR NEGATIVE 05/27/2007 1525   UROBILINOGEN 0.2 05/27/2007 1525   NITRITE Negative 06/25/2012 1147   NITRITE NEGATIVE 05/27/2007 1525   LEUKOCYTESUR Negative 06/25/2012 1147   Sepsis Labs: @LABRCNTIP (procalcitonin:4,lacticidven:4) )No results found for this or any previous visit (from the past 240 hour(s)).   Radiological Exams on Admission: DG Chest 2 View  Result Date: 08/01/2021 CLINICAL DATA:  Chest pain EXAM: CHEST - 2 VIEW COMPARISON:  11/03/2016 FINDINGS: Right AICD remains in place, unchanged. Heart and mediastinal contours are within normal limits. No focal opacities or effusions. No acute bony abnormality. IMPRESSION: No active cardiopulmonary disease. Electronically Signed   By: 01/03/2017 M.D.   On: 08/01/2021 01:21      Assessment/Plan Principal Problem:   Chest pain Active Problems:   CAD (coronary artery disease)   Atrial fibrillation, chronic (HCC)   Diabetes mellitus without complication (HCC)   Chronic diastolic CHF (congestive heart failure) (HCC)   Hypertension   HLD (hyperlipidemia)   Hypokalemia    Assessment and Plan: * Chest pain Chest pain and hx of CAD:  s/p of 12 stent placement per pt. Trop  9 --> 9. Consulted Dr. 08/03/2021 of cardiology  - place to tele bed for observation - Trend Trop -  pt is allergic to Nitroglycerin, - prn Morphine - lipitor  - Patient received 324 mg of aspirin, will continue ASA 81 mg daily - Patient is on Plavix - Risk factor stratification: will check FLP and A1C  - check UDS - 2d echo - IV heparin per cardiac recommendations    CAD (coronary artery disease) -see above  Atrial fibrillation, chronic (HCC) Pt is on coumadin with INR 1.9, slightly subtherapeutic   heart rate 50-60s -continue coumadin -start IV heparin per current recommendations -Continue metoprolol 50 mg daily   Diabetes mellitus without complication (HCC) Recent A1c 6.3, well controlled.  Patient is on metformin -Sliding scale insulin  Chronic diastolic CHF (congestive heart failure) (HCC) 2D echo on 12/26/2019 showed EF of 51%.  Patient does not have leg edema.  No JVD.  CHF is compensated. -Watch volume status closely  Hypertension - IV hydralazine as needed -Amlodipine, lisinopril, Metoprolol  HLD (hyperlipidemia) -Lipitor  Hypokalemia K 3.2. -Repleted potassium -Check a magnesium level --> 2.3             DVT ppx: on coumadin and IV hepain  Code Status: Full code  Family Communication: I offered to call his family, but the patient states that I do not need to call his family, and he will call by himself.  Disposition Plan:  Anticipate discharge back to previous environment  Consults called: Dr. Darrold Junker of cardiology  Admission status and Level of care: Telemetry Cardiac:    Med-surg bed for obs as inpt    progressive unit for obs   as inpt      SDU/inpation         Severity of Illness:  The appropriate patient status for this patient is OBSERVATION. Observation status is judged to be reasonable and necessary in order to provide the required intensity of service to ensure the patient's safety. The  patient's presenting symptoms, physical exam findings, and initial radiographic and laboratory data in the context of their medical condition is felt to place them at decreased risk for further clinical deterioration. Furthermore, it is anticipated that the patient will be medically stable for discharge from the hospital within 2 midnights of admission.        Date of Service 08/01/2021    Lorretta Harp [facility]   If 7PM-7AM, please contact night-coverage [email or web address] 08/01/2021, 2:30 PM

## 2021-08-02 ENCOUNTER — Observation Stay: Admit: 2021-08-02 | Payer: Medicare PPO

## 2021-08-02 DIAGNOSIS — R079 Chest pain, unspecified: Secondary | ICD-10-CM | POA: Diagnosis not present

## 2021-08-02 LAB — BASIC METABOLIC PANEL
Anion gap: 5 (ref 5–15)
BUN: 14 mg/dL (ref 8–23)
CO2: 26 mmol/L (ref 22–32)
Calcium: 8.6 mg/dL — ABNORMAL LOW (ref 8.9–10.3)
Chloride: 109 mmol/L (ref 98–111)
Creatinine, Ser: 1.06 mg/dL (ref 0.61–1.24)
GFR, Estimated: >60 mL/min (ref >60)
Glucose, Bld: 119 mg/dL — ABNORMAL HIGH (ref 70–99)
Potassium: 3.8 mmol/L (ref 3.5–5.1)
Sodium: 140 mmol/L (ref 135–145)

## 2021-08-02 LAB — CBC
HCT: 33.3 % — ABNORMAL LOW (ref 39.0–52.0)
Hemoglobin: 9.6 g/dL — ABNORMAL LOW (ref 13.0–17.0)
MCH: 21.2 pg — ABNORMAL LOW (ref 26.0–34.0)
MCHC: 28.8 g/dL — ABNORMAL LOW (ref 30.0–36.0)
MCV: 73.5 fL — ABNORMAL LOW (ref 80.0–100.0)
Platelets: 324 10*3/uL (ref 150–400)
RBC: 4.53 MIL/uL (ref 4.22–5.81)
RDW: 17.6 % — ABNORMAL HIGH (ref 11.5–15.5)
WBC: 5 10*3/uL (ref 4.0–10.5)
nRBC: 0 % (ref 0.0–0.2)

## 2021-08-02 LAB — HEPARIN LEVEL (UNFRACTIONATED): Heparin Unfractionated: 0.2 IU/mL — ABNORMAL LOW (ref 0.30–0.70)

## 2021-08-02 LAB — GLUCOSE, CAPILLARY: Glucose-Capillary: 113 mg/dL — ABNORMAL HIGH (ref 70–99)

## 2021-08-02 LAB — PROTIME-INR
INR: 2 — ABNORMAL HIGH (ref 0.8–1.2)
Prothrombin Time: 22.5 seconds — ABNORMAL HIGH (ref 11.4–15.2)

## 2021-08-02 LAB — TROPONIN I (HIGH SENSITIVITY): Troponin I (High Sensitivity): 8 ng/L (ref <18)

## 2021-08-02 MED ORDER — HEPARIN BOLUS VIA INFUSION
1500.0000 [IU] | Freq: Once | INTRAVENOUS | Status: DC
  Filled 2021-08-02: qty 1500

## 2021-08-02 MED ORDER — WARFARIN SODIUM 6 MG PO TABS
9.0000 mg | ORAL_TABLET | Freq: Once | ORAL | Status: DC
  Filled 2021-08-02: qty 1

## 2021-08-02 MED ORDER — WARFARIN - PHARMACIST DOSING INPATIENT: Freq: Every day | Status: DC

## 2021-08-02 MED ORDER — HEPARIN BOLUS VIA INFUSION
1300.0000 [IU] | Freq: Once | INTRAVENOUS | Status: AC
  Administered 2021-08-02: 1300 [IU] via INTRAVENOUS
  Filled 2021-08-02: qty 1300

## 2021-08-02 MED ORDER — WARFARIN SODIUM 7.5 MG PO TABS
7.5000 mg | ORAL_TABLET | Freq: Once | ORAL | Status: DC
  Filled 2021-08-02: qty 1

## 2021-08-02 MED ORDER — HEPARIN (PORCINE) 25000 UT/250ML-% IV SOLN: 1300.0000 [IU]/h | INTRAVENOUS | Status: DC

## 2021-08-02 MED ORDER — ALUM & MAG HYDROXIDE-SIMETH 200-200-20 MG/5ML PO SUSP
30.0000 mL | ORAL | Status: DC | PRN
  Filled 2021-08-02: qty 30

## 2021-08-02 NOTE — Progress Notes (Signed)
Spoke with Manuela Schwartz, NP regarding patient complaining of 8/10 chest pain.  Pt's BP is consistently been in the 90s systolic and this RN asking if okay to give IV morphine per patient request.  NP suggested giving oxycodone, but this RN informed her that pt states he has "burning" in stomach when taking PO oxycodone.  Orders placed for Maalox.  This RN attempted to administered Maalox and Oxycodone to patient for pain.  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  I just want the IV morphine. "  This RN educated patient regarding reasons IV morphine is contraindicated at this time.  Pt stated "they didn't have an issue giving it to me downstairs."  This RN then reviewed vitals from earlier and explained that patient's BP was higher when they were giving IV morphine in the ED.  Pt refused oxycodone at this time.  This RN informed Manuela Schwartz, NP.  Pt is relaxed, drinking pepsi, and breathing even and unlabored.  Pt is in no apparent distress at this time.      08/02/21 0547  Provider Notification  Provider Name/Title B Jon Billings  Date Provider Notified 08/02/21  Time Provider Notified [phone]  Method of Notification Page  Notification Reason Requested by patient/family (Pt requesting pain medication for chest pain, BP low)  Provider response See new orders  Date of Provider Response 08/02/21  Time of Provider Response 0600

## 2021-08-02 NOTE — Discharge Summary (Signed)
Physician Discharge Summary   Patient: [name] MRN: [id number] DOB: Oct 27, 1955  Admit date:     08/01/2021  Discharge date: 08/02/21  Discharge Physician: Arnetha Courser   PCP: Almon Register, MD   Recommendations at discharge:  Please have your INR checked next week. Follow-up with your cardiologist closely.  Discharge Diagnoses: Principal Problem:   Chest pain Active Problems:   CAD (coronary artery disease)   Atrial fibrillation, chronic (HCC)   Diabetes mellitus without complication (HCC)   Chronic diastolic CHF (congestive heart failure) (HCC)   Hypertension   HLD (hyperlipidemia)   Hypokalemia   Hospital Course: Taken from H&P and consult note.   [name] is a 66 y.o. male with medical history significant of A fib on Coumadin, hypertension, diabetes, dCHF, HLD, s/p of pacemaker, CAD, status post 12 stents, Factor 5 Leiden mutation, heterozygous, who presents with chest pain.   Patient states that his chest pain started last night, which is located in the substernal area and also on the left side of the chest, 9 out of 10 in severity, aching, intermittent, nonradiating.  Associated with mild shortness breath. Patient has mild dry cough, no fever or chills. It is not pleuritic, not aggravated by deep breath.  No nausea vomiting, diarrhea or abdominal pain.  No symptoms of UTI.  Cardiology was consulted due to his complicated prior cardiac history and according to their note his EP and care is at [facility], no excess to records.  Per note he was seen by his EP Dr. Clelia Croft at [facility] in [place] yesterday morning for follow-up with his defibrillator.  He said it was interrogated and he is not sure if any changes were made, but was told to follow-up in 1 month.  He reportedly was just discharged after after a 1 week hospitalization at [facility] where the patient states he had an issue with his atrial lead not firing appropriately and he is still concerned about  this..  He also tells me over the past month he had 4 heart catheterizations, the first 3 each showing progressive blockages, requiring ballooning of various stents, and reportedly has a history of 12 stents.  Data Reviewed and ED Course: pt was found to have INR 1.9, troponin 9 --> 9, WBC 6.7, GFR > 60, potassium 3.2, temperature normal, blood pressure 116/63, heart rate 55, RR 20, oxygen saturation 100% on room air.  Chest x-ray negative.  Dr. Darrold Junker of cardiology is consulted.    EKG: I have personally reviewed.  Sinus rhythm, QTc 428, borderline LAD, artificial effects.  6/30: Troponin remain negative.  Labs stable.  Patient has very nonspecific pain. He believes that he is on heparin and it is helping and would like to continue with heparin despite telling him that he is not on heparin and he got upset with me.  He wants to have his INR above 2.5 before discharge despite reassurance.  At times becoming very upset and arguing. He was given 10 mg of Coumadin today and will continue his normal dose of 7.5 mg. He was given appointment to have his levels checked on Wednesday due to it being holiday on Monday and Tuesday.  Patient was not placed on heparin infusion as there was no need. Patient does not need any further cardiac evaluation at this time.  Patient is being discharged and need to have a close follow-up with his cardiologist for further recommendations.  He will continue his current medications.  Assessment and Plan: *  Chest pain Chest pain and hx of CAD: s/p of 12 stent placement per pt. Trop  9 --> 9. Consulted Dr. Darrold Junker of cardiology  - place to tele bed for observation - Trend Trop - pt is allergic to Nitroglycerin, - prn Morphine - lipitor  - Patient received 324 mg of aspirin, will continue ASA 81 mg daily - Patient is on Plavix - Risk factor stratification: will check FLP and A1C  - check UDS - 2d echo - IV heparin per cardiac recommendations    CAD (coronary  artery disease) -see above  Atrial fibrillation, chronic (HCC) Pt is on coumadin with INR 1.9, slightly subtherapeutic   heart rate 50-60s -continue coumadin -start IV heparin per current recommendations -Continue metoprolol 50 mg daily   Diabetes mellitus without complication (HCC) Recent A1c 6.3, well controlled.  Patient is on metformin -Sliding scale insulin  Chronic diastolic CHF (congestive heart failure) (HCC) 2D echo on 12/26/2019 showed EF of 51%.  Patient does not have leg edema.  No JVD.  CHF is compensated. -Watch volume status closely  Hypertension - IV hydralazine as needed -Amlodipine, lisinopril, Metoprolol  HLD (hyperlipidemia) -Lipitor  Hypokalemia K 3.2. -Repleted potassium -Check a magnesium level --> 2.3   Pain control - [place] Controlled Substance Reporting System database was reviewed. and patient was instructed, not to drive, operate heavy machinery, perform activities at heights, swimming or participation in water activities or provide baby-sitting services while on Pain, Sleep and Anxiety Medications; until their outpatient Physician has advised to do so again. Also recommended to not to take more than prescribed Pain, Sleep and Anxiety Medications.  Consultants: Cardiology Procedures performed: None Disposition: Home Diet recommendation:  Discharge Diet Orders (From admission, onward)     Start     Ordered   08/02/21 0000  Diet - low sodium heart healthy        08/02/21 1418           Cardiac and Carb modified diet DISCHARGE MEDICATION: Allergies as of 08/02/2021       Reactions   Celecoxib Other (See Comments)   Other reaction(s): GI Bleed, Other - See Comments GI BLEEDING Other Reaction: GI BLEEDING GI bleeding   Ciprofloxacin Anaphylaxis, Other (See Comments)   Other reaction(s): Other - See Comments Pt went into At Fib Other Reaction: PUT PT IN A FIB   Hydromorphone Nausea And Vomiting   Other reaction(s):  Vomiting Nausea and vomiting   Oxycodone-acetaminophen Nausea And Vomiting   Other reaction(s): Vomiting   Ranolazine Anaphylaxis   Patient describes abdominal swelling/bloating that radiates to neck/throat Patient describes abdominal swelling/bloating that radiates to neck/throat   Rivaroxaban Rash   Other reaction(s): Other (See Comments), Other - See Comments SEVERE RECTAL BLEED Sever rectal bleeding Sever rectal bleeding SEVERE RECTAL BLEED   Ezetimibe Diarrhea   Other reaction(s): cough, Other - See Comments Cough Cough   Apixaban    Dabigatran Etexilate Mesilate [dabigatran]    Dilaudid [hydromorphone Hcl]    Nitroglycerin    Prednisone    Tramadol         Medication List     STOP taking these medications    amoxicillin 500 MG capsule Commonly known as: AMOXIL   hydrochlorothiazide 25 MG tablet Commonly known as: HYDRODIURIL   LOVASTATIN PO   metoprolol tartrate 100 MG tablet Commonly known as: LOPRESSOR   oxybutynin 10 MG 24 hr tablet Commonly known as: DITROPAN-XL       TAKE these medications  albuterol 108 (90 Base) MCG/ACT inhaler Commonly known as: VENTOLIN HFA Inhale 2 puffs into the lungs every 4 (four) hours as needed.   amLODipine 5 MG tablet Commonly known as: NORVASC Take 5 mg by mouth 2 (two) times daily.   atorvastatin 80 MG tablet Commonly known as: LIPITOR Take 80 mg by mouth daily.   clopidogrel 75 MG tablet Commonly known as: PLAVIX Take 75 mg by mouth daily.   finasteride 5 MG tablet Commonly known as: PROSCAR Take 5 mg by mouth daily.   gabapentin 600 MG tablet Commonly known as: NEURONTIN Take 600 mg by mouth in the morning, at noon, and at bedtime.   ipratropium-albuterol 0.5-2.5 (3) MG/3ML Soln Commonly known as: DUONEB Take 3 mLs by nebulization every 6 (six) hours.   Klor-Con M20 20 MEQ tablet Generic drug: potassium chloride SA Take 20 mEq by mouth daily.   lisinopril 20 MG tablet Commonly known as:  ZESTRIL Take 20 mg by mouth daily.   magnesium oxide 400 (240 Mg) MG tablet Commonly known as: MAG-OX Take 400 mg by mouth daily.   metFORMIN 500 MG tablet Commonly known as: GLUCOPHAGE Take by mouth 2 (two) times daily with a meal.   metoprolol succinate 50 MG 24 hr tablet Commonly known as: TOPROL-XL Take 50 mg by mouth daily.   nicotine polacrilex 4 MG gum Commonly known as: NICORETTE Take 4 mg by mouth as needed for smoking cessation.   oxyCODONE-acetaminophen 10-325 MG tablet Commonly known as: PERCOCET Take 1 tablet by mouth every 6 (six) hours as needed for pain.   pantoprazole 40 MG tablet Commonly known as: PROTONIX Take 40 mg by mouth 2 (two) times daily.   warfarin 7.5 MG tablet Commonly known as: COUMADIN Take 7.5 mg by mouth daily.   zolpidem 10 MG tablet Commonly known as: AMBIEN Take 10 mg by mouth at bedtime.        Follow-up Information     Paraschos, Alexander, MD. Go in 1 week(s).   Specialty: Cardiology Contact information: [address] [facility] [place] [address] [phone]         Almon Register, MD. Schedule an appointment as soon as possible for a visit in 1 week(s).   Specialty: Internal Medicine Contact information: [address] [place] [address] [phone]                Discharge Exam: Ceasar Mons Weights   08/01/21 0059  Weight: 88 kg   General.     In no acute distress. Pulmonary.  Lungs clear bilaterally, normal respiratory effort. CV.  Regular rate and rhythm, no JVD, rub or murmur. Abdomen.  Soft, nontender, nondistended, BS positive. CNS.  Alert and oriented .  No focal neurologic deficit. Extremities.  No edema, no cyanosis, pulses intact and symmetrical. Psychiatry.  Judgment and insight appears normal.   Condition at discharge: stable  The results of significant diagnostics from this hospitalization (including imaging, microbiology, ancillary and laboratory) are  listed below for reference.   Imaging Studies: DG Chest 2 View  Result Date: 08/01/2021 CLINICAL DATA:  Chest pain EXAM: CHEST - 2 VIEW COMPARISON:  11/03/2016 FINDINGS: Right AICD remains in place, unchanged. Heart and mediastinal contours are within normal limits. No focal opacities or effusions. No acute bony abnormality. IMPRESSION: No active cardiopulmonary disease. Electronically Signed   By: Charlett Nose M.D.   On: 08/01/2021 01:21    Microbiology: No results found for this or any previous visit.  Labs:  CBC: Recent Labs  Lab 08/01/21 0103 08/02/21 0458  WBC 6.7 5.0  HGB 10.2* 9.6*  HCT 36.0* 33.3*  MCV 73.9* 73.5*  PLT 378 324   Basic Metabolic Panel: Recent Labs  Lab 08/01/21 0103 08/01/21 0401 08/02/21 0458  NA 140  --  140  K 3.2*  --  3.8  CL 108  --  109  CO2 24  --  26  GLUCOSE 150*  --  119*  BUN 12  --  14  CREATININE 0.96  --  1.06  CALCIUM 9.3  --  8.6*  MG  --  2.3  --    Liver Function Tests: No results for input(s): "AST", "ALT", "ALKPHOS", "BILITOT", "PROT", "ALBUMIN" in the last 168 hours. CBG: Recent Labs  Lab 08/01/21 0759 08/01/21 2222 08/01/21 2239 08/02/21 0813  GLUCAP 87 66* 88 113*    Discharge time spent: greater than 30 minutes.  This record has been created using Conservation officer, historic buildings. Errors have been sought and corrected,but may not always be located. Such creation errors do not reflect on the standard of care.   Signed: Arnetha Courser, MD [facility] 08/02/2021

## 2021-08-02 NOTE — Progress Notes (Signed)
PIV removed. Discharge instructions completed. Patient verbalized understanding of medication regimen, follow up appointments and discharge instructions. Patient belongings gathered and packed to discharge.  

## 2021-08-02 NOTE — Progress Notes (Signed)
[facility] CLINIC CARDIOLOGY CONSULT NOTE       Patient ID: KIMM UNGARO MRN: [id number] DOB/AGE: April 12, 1955 66 y.o.  Admit date: 08/01/2021 Referring Physician Dr. Clyde Lundborg Primary Physician Dr. Docia Furl in [facility] Primary Cardiologist Dr. Clelia Croft [facility] - EP)  Reason for Consultation chest pain   HPI: Endre Coutts is a 25FTD with a PMH of CAD with known jailed D1 s/p DES to LAD 11/2017 with restenosis 06/2018) DES to LAD and ramus 07/19/2018, DES to distal ramus 08/2018 restenosis of distal ramus s/p new DES 04/2019, DES mid RCA 04/28/2019, HF history of pulmonary sarcoid with restrictive CM, atrial fibrillation s/p multiple ablations, complete heart block  and ?history of VT s/p Saint Jude ICD with gen change out 01/2020 who presented to [facility] ED 07/24/2021 with chest pain.  Cardiology is consulted for further assistance.  Interval History:  -reported 8/10 chest pain this morning while laying in bed, refused PO oxycodone and requested IV morphine which was not administered d/t hypotension -INR therapeutic at 2.0 but still below goal of 2.5  -says his chest pain felt better when he was on heparin. Reports significant chest pain during interview, appears comfortable and is eating lunch.   Review of systems complete and found to be negative unless listed above    Past Medical History:  Diagnosis Date   Atrial fibrillation (HCC)    CHF (congestive heart failure) (HCC)    Diabetes mellitus without complication (HCC)    Factor 5 Leiden mutation, heterozygous (HCC)    Hypertension    Pacemaker     Past Surgical History:  Procedure Laterality Date   CARDIAC SURGERY     HERNIA REPAIR     TRANSURETHRAL RESECTION OF PROSTATE      Medications Prior to Admission  Medication Sig Dispense Refill Last Dose   amLODipine (NORVASC) 5 MG tablet Take 5 mg by mouth 2 (two) times daily.   07/31/2021   atorvastatin (LIPITOR) 80 MG tablet Take 80 mg by mouth daily.   07/31/2021   clopidogrel  (PLAVIX) 75 MG tablet Take 75 mg by mouth daily.   07/31/2021   finasteride (PROSCAR) 5 MG tablet Take 5 mg by mouth daily.   07/31/2021   gabapentin (NEURONTIN) 600 MG tablet Take 600 mg by mouth in the morning, at noon, and at bedtime.   07/31/2021   lisinopril (ZESTRIL) 20 MG tablet Take 20 mg by mouth daily.   07/31/2021   magnesium oxide (MAG-OX) 400 (240 Mg) MG tablet Take 400 mg by mouth daily.   07/31/2021   metoprolol succinate (TOPROL-XL) 50 MG 24 hr tablet Take 50 mg by mouth daily.   07/31/2021   pantoprazole (PROTONIX) 40 MG tablet Take 40 mg by mouth 2 (two) times daily.   07/31/2021   potassium chloride SA (KLOR-CON M20) 20 MEQ tablet Take 20 mEq by mouth daily.   prn at prn   warfarin (COUMADIN) 7.5 MG tablet Take 7.5 mg by mouth daily.   07/31/2021 at 1000   zolpidem (AMBIEN) 10 MG tablet Take 10 mg by mouth at bedtime.   07/31/2021   albuterol (VENTOLIN HFA) 108 (90 Base) MCG/ACT inhaler Inhale 2 puffs into the lungs every 4 (four) hours as needed.   prn at prn   amoxicillin (AMOXIL) 500 MG capsule take 1 capsule in the morning and 1 capsule at noon and 1 capsule before bedtime by mouth. (Patient not taking: Reported on 08/01/2021)   Completed Course   hydrochlorothiazide (HYDRODIURIL) 25  MG tablet Take 25 mg by mouth daily. (Patient not taking: Reported on 08/01/2021)   Not Taking   ipratropium-albuterol (DUONEB) 0.5-2.5 (3) MG/3ML SOLN Take 3 mLs by nebulization every 6 (six) hours.   prn at prn   LOVASTATIN PO Take by mouth daily. (Patient not taking: Reported on 08/01/2021)   Not Taking   metFORMIN (GLUCOPHAGE) 500 MG tablet Take by mouth 2 (two) times daily with a meal. (Patient not taking: Reported on 08/01/2021)   Not Taking   metoprolol tartrate (LOPRESSOR) 100 MG tablet Take 100 mg by mouth daily. (Patient not taking: Reported on 08/01/2021)   Not Taking   nicotine polacrilex (NICORETTE) 4 MG gum Take 4 mg by mouth as needed for smoking cessation.   prn at prn   oxybutynin  (DITROPAN-XL) 10 MG 24 hr tablet Take 10 mg by mouth at bedtime. (Patient not taking: Reported on 08/01/2021)   Not Taking   oxyCODONE-acetaminophen (PERCOCET) 10-325 MG tablet Take 1 tablet by mouth every 6 (six) hours as needed for pain.   prn at prn    Social History   Socioeconomic History   Marital status: Single    Spouse name: Not on file   Number of children: Not on file   Years of education: Not on file   Highest education level: Not on file  Occupational History   Not on file  Tobacco Use   Smoking status: Former   Smokeless tobacco: Never  Vaping Use   Vaping Use: Never used  Substance and Sexual Activity   Alcohol use: No   Drug use: No   Sexual activity: Not on file  Other Topics Concern   Not on file  Social History Narrative   Not on file   Social Determinants of Health   Financial Resource Strain: Not on file  Food Insecurity: Not on file  Transportation Needs: Not on file  Physical Activity: Not on file  Stress: Not on file  Social Connections: Not on file  Intimate Partner Violence: Not on file    Family History  Problem Relation Age of Onset   Diabetes Mother    CAD Mother    Hypertension Mother    CAD Father    Hyperlipidemia Father    Hypertension Father       PHYSICAL EXAM General: Black male, well nourished, in no acute distress. Sitting upright eating lunch in PCU bed.  HEENT:  Normocephalic and atraumatic. Neck:  No JVD.  Lungs: Normal respiratory effort on room air. Clear bilaterally to auscultation. No wheezes, crackles, rhonchi.  Heart: HRRR . Normal S1 and S2 without gallops or murmurs. Radial & DP pulses 2+ bilaterally. Abdomen: Non-distended appearing.  Msk: Normal strength and tone for age. Extremities: Warm and well perfused. No clubbing, cyanosis.  No peripheral edema.  Neuro: Alert and oriented X 3. Psych:  Answers questions appropriately.   Labs:   Lab Results  Component Value Date   WBC 5.0 08/02/2021   HGB 9.6 (L)  08/02/2021   HCT 33.3 (L) 08/02/2021   MCV 73.5 (L) 08/02/2021   PLT 324 08/02/2021    Recent Labs  Lab 08/02/21 0458  NA 140  K 3.8  CL 109  CO2 26  BUN 14  CREATININE 1.06  CALCIUM 8.6*  GLUCOSE 119*    Lab Results  Component Value Date   CKTOTAL 338 (H) 09/09/2012   CKMB 2.7 09/09/2012   TROPONINI <0.03 11/03/2016    Lab Results  Component Value Date  CHOL 126 08/01/2021   CHOL 140 09/10/2012   CHOL 127 08/24/2011   Lab Results  Component Value Date   HDL 34 (L) 08/01/2021   HDL 21 (L) 09/10/2012   HDL 20 (L) 08/24/2011   Lab Results  Component Value Date   LDLCALC 65 08/01/2021   LDLCALC 63 09/10/2012   LDLCALC 47 08/24/2011   Lab Results  Component Value Date   TRIG 137 08/01/2021   TRIG 281 (H) 09/10/2012   TRIG 298 (H) 08/24/2011   Lab Results  Component Value Date   CHOLHDL 3.7 08/01/2021   No results found for: "LDLDIRECT"    Radiology: DG Chest 2 View  Result Date: 08/01/2021 CLINICAL DATA:  Chest pain EXAM: CHEST - 2 VIEW COMPARISON:  11/03/2016 FINDINGS: Right AICD remains in place, unchanged. Heart and mediastinal contours are within normal limits. No focal opacities or effusions. No acute bony abnormality. IMPRESSION: No active cardiopulmonary disease. Electronically Signed   By: Charlett Nose M.D.   On: 08/01/2021 01:21     Left heart cath at [facility]. Jomarie Longs 07/26/2021   Prox RCA to Mid RCA lesion is 15% stenosed.    2nd Diag lesion is 70% stenosed.    Mid LAD lesion is 30% stenosed.   Angiogram details:  - LM: No angiographically significant disease.  - LAD: mid LAD stent with 15% ISR. 2nd Diagonal with 70% stenosis.  - LCx: mild luminal irregularities.  - RCA: patent stents in proximal to mid RCA.  - LVEDP: 20 mmHg  - V-gram EF: 50%   Impression:  1. The 2nd diagonal vessel has 70% ostial stenosis which has progressed  from 40% stenosis since the end of last case.  2. Otherwise stable coronary disease, residual ISR unchanged  from last  month intervention  3. Right radial hemostasis with TR band.   Plan:  1. Recommend anti-anginal up-titration.  D2 has recurrent ostial stenosis  however given the recurrence in one month from prior PCI its unlikely that  repeat intervention will have a more durable result.  Recommend medical  management of this lesion.  If angina persists despite this, repeat PCI  could be considered   Procedure Details  Procedure: Left Heart Catheterization with Coronary angiography   Indication: Chest pain.  Access: Right radial artery, 6 Fr sheath, US-guided  Guide and diagnostic catheters: 59F mpa2  Hemostasis: TR band.   Procedure in detail:  After informed consent was obtained, the patient was prepped and draped in the usual sterile fashion. The right radial artery was accessed using a micropuncture system and a sheath was placed. Diagnostic angiography was accomplished using above noted catheters. Left and right coronary arteries were selectively engaged and angiography was performed. Sheath was removed and hemostasis achieved with TR band. Patient tolerated procedure well without any immediate complications.   Angiogram details:  - LM: No angiographically significant disease.  - LAD: mid LAD stent with 30% ISR. 2nd Diagonal with 70% stenosis. D1 without restenosis present  - LCx: mild luminal irregularities.  - RCA: patent stents in proximal to mid RCA.  - LVEDP: 20 mmHg  - V-gram EF: 50%   Impression:  1. The 2nd diagonal vessel has 70% ostial stenosis which has progressed from 40% stenosis since the end of last case.  2. Otherwise stable coronary disease, residual ISR unchanged from last month intervention  3. Right radial hemostasis with TR band.   Plan:  1. Recommend anti-anginal up-titration. D2 has recurrent ostial stenosis however given  the recurrence in one month from prior PCI its unlikely that repeat intervention will have a more durable result. Recommend medical  management of this lesion. If angina persists despite this, repeat PCI could be considered   Coronary Findings Diagnostic Dominance: Right  Left Main: The vessel is moderate in size and is angiographically normal.  Left Anterior Descending: Mid LAD lesion is 30% stenosed. The lesion was previously treated using a drug-eluting stent. Stent delivery was done by way of balloon expansion. First Diagonal Branch: Previously placed 1st Diag-1 drug eluting stent is widely patent. Stent delivery was done by way of balloon expansion. Previously placed 1st Diag-2 drug eluting stent is widely patent. Culprit lesion. The lesion was previously treated using atherectomy. Atherectomy was performed using a laser device. Second Diagonal Branch: 2nd Diag lesion is 70% stenosed. The lesion was previously treated using a drug-eluting stent. Stent delivery was done by way of balloon expansion.  Left Circumflex: The vessel was visualized by angiography and is moderate in size. There is mild diffuse disease throughout the vessel.  Right Coronary Artery: The vessel was visualized by angiography and is moderate in size. There is mild diffuse disease throughout the vessel. Prox RCA to Mid RCA lesion is 15% stenosed. The lesion was previously treated using a stent of unknown type.   Intervention  No interventions have been documented.   Cath Indications  Patient presented with atypical anginal chest pain.   12/26/2019 - [facility] 11/22 Diagnostic Findings Coronary arteries Dominance: right Left main: normal LAD: 1st diagonal 50% - large, previous stent; 2nd diagonal 90% - large LCx: normal RCA: insignificant  Impressions Single vessel coronary artery disease. Patent LAD and RCA stents D1 stent has ISR but not severe and should not cause rest angina  ECHO 12/26/2019 ECHOCARDIOGRAPHIC MEASUREMENTS -----------------------------------------------  2D DIMENSIONS  AORTA          Values     Normal RangeMAIN PA       Values     Normal Range       Annulus:  nm*  cm    [2 - 3.2]      PA Main:   2.1 cm    [1.5 - 2.1]     Aorta Sin:   2.7 cm    [2.8 - 4]   RIGHT VENTRICLE   ST Junction:  nm*  cm    [2.3 - 3.5]    RV Base:   4.4 cm    [2.5 - 4.1]     Asc.Aorta:   2.9 cm    [2.2 - 3.8]     RV Mid:   3.7 cm    [1.9 - 3.5]  LEFT VENTRICLE                         RV Length:  nm*  cm    [  ]         LVIDd:   4.3 cm    [4.2 - 5.8] RIGHT ATRIUM         LVIDs:   2.3 cm    [2.4 - 4]      RA Area:  12   cm2   [ <= 20]        LVEDVi:  88.0 ml/m2 [34 - 74]         RAVi:  15   ml/m2 [11 - 39]        LVESVi:  43.0 ml/m2 [11 - 31]  INFERIOR VENA CAVA            FS:  47   %     [ >= 25]       Max.IVC:   1.3 cm    [ <= 2.1]           SWT:   1.1 cm    [0.6 - 1]      Min.IVC:  nm*  cm    [ <= 1.7]           PWT:   1.2 cm    [0.6 - 1]   __________________  LEFT ATRIUM                           nm* - not measured       LA Diam:   4.1 cm    [3 - 4]       LA Area:  22   cm2   [ <= 20]     LA Volume:  67   mL    [18 - 58]          LAVi:  34   ml/m2 [16 - 34]   ECHOCARDIOGRAPHIC DESCRIPTIONS -----------------------------------------------  AORTIC ROOT          Size: Normal    Dissection: INDETERM FOR DISSECTION   AORTIC VALVE      Leaflets: Tricuspid             Morphology: Normal      Mobility: Fully Mobile   LEFT VENTRICLE                                      Anterior: HYPOCONTRACTILE          Size: Normal                                 Lateral: Normal   Contraction: REGIONALLY IMPAIRED                     Septal: Normal    Closest EF: 50% (Estimated)  Calc.EF: 51% (3D)      Apical: Normal     LV masses: No Masses                             Inferior: Normal           LVH: MILD LVH CONCENTRIC                  Posterior: Normal   LV GLS(AVG): -14.5% Normal Range [ <= -16]  Dias.FxClass: INDETERMINATE   MITRAL VALVE      Leaflets: Normal                  Mobility: Fully mobile    Morphology: Normal   LEFT ATRIUM           Size: Normal     LA masses: No masses                Normal IAS   MAIN PA          Size: Normal   PULMONIC VALVE    Morphology: Not seen      Mobility: Not seen   RIGHT VENTRICLE  Size: MILDLY ENLARGED           Free wall: Normal   Contraction: Normal                    RV masses: CATHETER IN RV         TAPSE:   2.4 cm,  Normal Range [>= 1.6 cm]       RV Note: RV S' = 11 cm/sec   TRICUSPID VALVE      Leaflets: Normal                  Mobility: Fully mobile    Morphology: Normal   RIGHT ATRIUM          Size: Normal                     RA Other: None     RA masses: CATHETER IN RA   PERICARDIUM         Fluid: No effusion   INFERIOR VENACAVA          Size: Normal     ABNORMAL RESPIRATORY COLLAPSE   DOPPLER ECHO and OTHER SPECIAL PROCEDURES ------------------------------------     Aortic: No AR                  No AS      Mitral: MILD MR                No MS     MV Inflow E Vel.= 109.0 cm/s  MV Annulus E'Vel.= 8.5 cm/s  E/E'Ratio= 13   Tricuspid: TRIVIAL TR             No TS             2.4 m/s peak TR vel   31 mmHg peak RV pressure   Pulmonary: TRIVIAL PR             No PS       Other:   INTERPRETATION ---------------------------------------------------------------    MILD LV DYSFUNCTION (See above) WITH MILD LVH    NORMAL RIGHT VENTRICULAR SYSTOLIC FUNCTION    VALVULAR REGURGITATION: MILD MR, TRIVIAL PR, TRIVIAL TR    NO VALVULAR STENOSIS    3D acquisition and reconstructions were performed as part of this    examination to more accurately quantify the effects of reduced left    ventricular ejection fraction. (post-processing on an Independent    workstation).   TELEMETRY reviewed by me: Sinus rhythm rate 70s  EKG reviewed by me: sinus rhythm rate 64 with history of old lateral infarct  ASSESSMENT AND PLAN:  Janes Colegrove is a 71yoM with a PMH of CAD with known jailed D1 s/p DES to LAD 11/2017 with restenosis 06/2018) DES to LAD and ramus 07/19/2018,  DES to distal ramus 08/2018 restenosis of distal ramus s/p new DES 04/2019, DES mid RCA 04/28/2019, HF history of pulmonary sarcoid with restrictive CM, atrial fibrillation s/p multiple ablations, complete heart block  and ?history of VT s/p Saint Jude ICD with gen change out 01/2020 who presented to [facility] ED 07/24/2021 with chest pain.  Cardiology is consulted for further assistance.  #Atypical chest pain #CAD with reported history of 12 stents. #History of VT and restrictive cardiomyopathy with pulmonary sarcoid s/p Saint Jude ICD The patient presents with reported 9 out of 10 chest pain that occurred while he was driving his car from [place] to [place] overnight, did not take any antianginals, and  was only slightly relieved by morphine from a 9 out of 10 to 8 out of 10 per his reporting.  He also tells me he had his defibrillator interrogated by his electrophysiologist at [facility] yesterday morning and he felt well, even walking 5 [place] on Monday without angina or other difficulty.  He also thinks one of his atrial leads is not firing appropriately, per his he thinks this discomfort is similar to what he had prior to his heart attacks in the past, but thankfully his troponins are negative on multiple repeats, and EKG is without acute ischemic changes.  He tells me he had 4 heart catheterizations within the past month at [facility], all requiring ballooning of his previously placed stents. He has multiple other complaints of a cough, sore throat, back pain, but is clinically well-appearing this morning of admission ,  Actually sleeping upon my arrival to the room despite having 9 out of 10 chest pain.  His pain seems somewhat out of proportion to his physical exam and lab findings, but does state he sometimes does have chest pain when his INR is subtheraputic. -S/p 325 mg aspirin, continue clopidogrel 75 mg monotherpay -Continue atorvastatin 80 mg daily -Antianginal therapy beta-blocker with  metoprolol XL 50 mg, amlodipine 5mg .  -He has allergies to nitroglycerin, Ranexa, and multiple other opioids. -refused echocardiogram this morning, further review of records from [facility] now available show a preserved ejection fraction of 55-60% with G1 DD and without valvular pathologies -Recent left heart cath from 7 days ago resulted above which ultimately recommended continued medical management and increase of the antianginal therapy if the patient can tolerate (amlodipine and metoprolol) -While it would be ideal for patient to say until his INR is 2.5 to see if his chest pain syndrome  resolves completely, he can also be safely discharged from a cardiac standpoint with close follow up with Jomarie Longs. -Wants to transfer care to Dr. [place], will set him up for appointment in 1-2 weeks  #Paroxysmal atrial fibrillation s/p multiple ablations, on warfarin #Factor V Leiden Currently in sinus rhythm on telemetry, INR trending 1.9 - 2.0  -s/p 10mg  warfarin yesterday, will dose another 10mg  today and then 7.5mg  thereafter per pharmacy. Will arrange to have him recheck his INR on Wednesday at [facility]  -goal INR 2.5  This patient's plan of care was discussed and created with Dr. and he is in agreement.  Signed: Friday , PA-C 08/02/2021, 12:27 PM [facility]

## 2021-08-02 NOTE — Consult Note (Addendum)
ANTICOAGULATION CONSULT NOTE  Pharmacy Consult for warfarin Indication: atrial fibrillation  Allergies  Allergen Reactions   Celecoxib Other (See Comments)    Other reaction(s): GI Bleed, Other - See Comments GI BLEEDING Other Reaction: GI BLEEDING GI bleeding    Ciprofloxacin Anaphylaxis and Other (See Comments)    Other reaction(s): Other - See Comments Pt went into At Fib Other Reaction: PUT PT IN A FIB    Hydromorphone Nausea And Vomiting    Other reaction(s): Vomiting Nausea and vomiting    Oxycodone-Acetaminophen Nausea And Vomiting    Other reaction(s): Vomiting   Ranolazine Anaphylaxis    Patient describes abdominal swelling/bloating that radiates to neck/throat Patient describes abdominal swelling/bloating that radiates to neck/throat    Rivaroxaban Rash    Other reaction(s): Other (See Comments), Other - See Comments SEVERE RECTAL BLEED Sever rectal bleeding Sever rectal bleeding SEVERE RECTAL BLEED    Ezetimibe Diarrhea    Other reaction(s): cough, Other - See Comments Cough Cough    Apixaban    Dabigatran Etexilate Mesilate [Dabigatran]    Dilaudid [Hydromorphone Hcl]    Nitroglycerin    Prednisone    Tramadol     Patient Measurements: Height: 5' 4.02" (162.6 cm) Weight: 88 kg (194 lb) IBW/kg (Calculated) : 59.24 Heparin Dosing Weight: 88kg  Vital Signs: Temp: 98.1 F (36.7 C) (06/30 0735) Temp Source: Oral (06/30 0735) BP: 94/57 (06/30 0735) Pulse Rate: 56 (06/30 0735)  Labs: Recent Labs    08/01/21 0103 08/01/21 0401 08/01/21 0834 08/01/21 1540 08/01/21 2222 08/02/21 0013 08/02/21 0458  HGB 10.2*  --   --   --   --   --  9.6*  HCT 36.0*  --   --   --   --   --  33.3*  PLT 378  --   --   --   --   --  324  LABPROT  --  21.9*  --   --   --   --  22.5*  INR  --  1.9*  --   --   --   --  2.0*  HEPARINUNFRC  --   --   --   --  0.33  --  0.20*  CREATININE 0.96  --   --   --   --   --  1.06  TROPONINIHS 9 9   < > 8 9 8   --    < > =  values in this interval not displayed.     Estimated Creatinine Clearance: 70.4 mL/min (by C-G formula based on SCr of 1.06 mg/dL).   Medical History: Past Medical History:  Diagnosis Date   Atrial fibrillation (HCC)    CHF (congestive heart failure) (HCC)    Diabetes mellitus without complication (HCC)    Factor 5 Leiden mutation, heterozygous (HCC)    Hypertension    Pacemaker     Medications:  PTA: Warfarin 7.5mg  every morning Last dose 6/28 in the morning Inpatient: Allergies: Apixaban (reaction not documented)  Assessment: [name] is a 66 y.o. male with history of atrial fibrillation on Coumadin, hypertension, diabetes, CHF that is post pacemaker, CAD status post 12 stents who presents to the emergency department with complaints of diffuse anterior chest pain.  Pharmacy consulted for inpatient management of warfarin and heparin in the setting of Atrial Fibrillation with possible cardiac procedure.  Date INR Warfarin Dose 6/28 ---- 7.5 mg 6/29 1.9 10 mg 6/30 2.0 7.5 mg  Goal of Therapy:  INR  2-3 Monitor platelets by anticoagulation protocol: Yes   Plan:  6/30 INR 2.0 this morning. Therapeutic on the low-end of goal 2.0-3.0. Will give 7.5 mg x1 (home dose) at 1600 today to make sure INR does not go above therapeutic range. Pt received 10 mg (~30% increase from home dose) on 6/29. Predict INR to continue to trend up 7/1 due to 10 mg dose. Daily INR ordered. Check INR with AM labs. CBC daily while on heparin.  Recommend discontinuing heparin infusion since INR is therapeutic.  Britini Garcilazo Al-Daghir, PharmD-Student 08/02/2021 8:04 AM

## 2021-08-02 NOTE — Progress Notes (Signed)
  Transition of Care [facility]) Screening Note   Patient Details  Name: [name] Date of Birth: 1955/07/15   Transition of Care [facility]) CM/SW Contact:    Gildardo Griffes, LCSW Phone Number: 08/02/2021, 2:19 PM    Transition of Care Department [facility]) has reviewed patient and no TOC needs have been identified at this time. We will continue to monitor patient advancement through interdisciplinary progression rounds. If new patient transition needs arise, please place a TOC consult.  [place], [place] [phone]

## 2021-08-02 NOTE — Consult Note (Signed)
ANTICOAGULATION CONSULT NOTE  Pharmacy Consult for warfarin Indication: atrial fibrillation  Allergies  Allergen Reactions   Celecoxib Other (See Comments)    Other reaction(s): GI Bleed, Other - See Comments GI BLEEDING Other Reaction: GI BLEEDING GI bleeding    Ciprofloxacin Anaphylaxis and Other (See Comments)    Other reaction(s): Other - See Comments Pt went into At Fib Other Reaction: PUT PT IN A FIB    Hydromorphone Nausea And Vomiting    Other reaction(s): Vomiting Nausea and vomiting    Oxycodone-Acetaminophen Nausea And Vomiting    Other reaction(s): Vomiting   Ranolazine Anaphylaxis    Patient describes abdominal swelling/bloating that radiates to neck/throat Patient describes abdominal swelling/bloating that radiates to neck/throat    Rivaroxaban Rash    Other reaction(s): Other (See Comments), Other - See Comments SEVERE RECTAL BLEED Sever rectal bleeding Sever rectal bleeding SEVERE RECTAL BLEED    Ezetimibe Diarrhea    Other reaction(s): cough, Other - See Comments Cough Cough    Apixaban    Dabigatran Etexilate Mesilate [Dabigatran]    Dilaudid [Hydromorphone Hcl]    Nitroglycerin    Prednisone    Tramadol     Patient Measurements: Height: 5' 4.02" (162.6 cm) Weight: 88 kg (194 lb) IBW/kg (Calculated) : 59.24 Heparin Dosing Weight: 88kg  Vital Signs: Temp: 98.5 F (36.9 C) (06/30 0342) BP: 99/65 (06/30 0547) Pulse Rate: 60 (06/30 0342)  Labs: Recent Labs    08/01/21 0103 08/01/21 0401 08/01/21 0834 08/01/21 1540 08/01/21 2222 08/02/21 0013 08/02/21 0458  HGB 10.2*  --   --   --   --   --  9.6*  HCT 36.0*  --   --   --   --   --  33.3*  PLT 378  --   --   --   --   --  324  LABPROT  --  21.9*  --   --   --   --  22.5*  INR  --  1.9*  --   --   --   --  2.0*  HEPARINUNFRC  --   --   --   --  0.33  --  0.20*  CREATININE 0.96  --   --   --   --   --  1.06  TROPONINIHS 9 9   < > 8 9 8   --    < > = values in this interval not  displayed.     Estimated Creatinine Clearance: 70.4 mL/min (by C-G formula based on SCr of 1.06 mg/dL).   Medical History: Past Medical History:  Diagnosis Date   Atrial fibrillation (HCC)    CHF (congestive heart failure) (HCC)    Diabetes mellitus without complication (HCC)    Factor 5 Leiden mutation, heterozygous (HCC)    Hypertension    Pacemaker     Medications:  PTA: Warfarin 7.5mg  every morning Last dose 6/28 in the morning Inpatient: Allergies: Apixaban (reaction not documented)  Assessment: [name] is a 66 y.o. male with history of atrial fibrillation on Coumadin, hypertension, diabetes, CHF that is post pacemaker, CAD status post 12 stents who presents to the emergency department with complaints of diffuse anterior chest pain.  Pharmacy consulted for inpatient management of warfarin and heparin in the setting of Atrial Fibrillation with possible cardiac procedure.  Goal of Therapy:  INR 2-3 Monitor platelets by anticoagulation protocol: Yes   Plan:  Warfarin - INR 1.9 this morning. Will give 10mg  dose at  1600. Check INR daily with AM labs  Heparin - HL 0.20 @ 0458, subtherapeutic - Bolus 1300 units x 1 - Increase heparin infusion to 1150 units/hr - Recheck HL in 6 hrs after rate change - CBC daily  Otelia Sergeant, PharmD, [facility] 08/02/2021 6:57 AM

## 2021-08-02 NOTE — Hospital Course (Addendum)
Taken from H&P and consult note.   [name] is a 66 y.o. male with medical history significant of A fib on Coumadin, hypertension, diabetes, dCHF, HLD, s/p of pacemaker, CAD, status post 12 stents, Factor 5 Leiden mutation, heterozygous, who presents with chest pain.   Patient states that his chest pain started last night, which is located in the substernal area and also on the left side of the chest, 9 out of 10 in severity, aching, intermittent, nonradiating.  Associated with mild shortness breath. Patient has mild dry cough, no fever or chills. It is not pleuritic, not aggravated by deep breath.  No nausea vomiting, diarrhea or abdominal pain.  No symptoms of UTI.  Cardiology was consulted due to his complicated prior cardiac history and according to their note his EP and care is at [facility], no excess to records.  Per note he was seen by his EP Dr. Clelia Harrison at [facility] in [place] yesterday morning for follow-up with his defibrillator.  He said it was interrogated and he is not sure if any changes were made, but was told to follow-up in 1 month.  He reportedly was just discharged after after a 1 week hospitalization at [facility] where the patient states he had an issue with his atrial lead not firing appropriately and he is still concerned about this..  He also tells me over the past month he had 4 heart catheterizations, the first 3 each showing progressive blockages, requiring ballooning of various stents, and reportedly has a history of 12 stents.  Data Reviewed and ED Course: pt was found to have INR 1.9, troponin 9 --> 9, WBC 6.7, GFR > 60, potassium 3.2, temperature normal, blood pressure 116/63, heart rate 55, RR 20, oxygen saturation 100% on room air.  Chest x-ray negative.  Dr. Darrold Harrison of cardiology is consulted.    EKG: I have personally reviewed.  Sinus rhythm, QTc 428, borderline LAD, artificial effects.  6/30: Troponin remain negative.  Labs stable.  Patient has very  nonspecific pain. He believes that he is on heparin and it is helping and would like to continue with heparin despite telling him that he is not on heparin and he got upset with me.  He wants to have his INR above 2.5 before discharge despite reassurance.  At times becoming very upset and arguing. He was given 10 mg of Coumadin today and will continue his normal dose of 7.5 mg. He was given appointment to have his levels checked on Wednesday due to it being holiday on Monday and Tuesday.  Patient was not placed on heparin infusion as there was no need.  Patient is being discharged and need to have a close follow-up with his cardiologist for further recommendations.  He will continue his current medications.

## 2021-08-02 NOTE — Consult Note (Addendum)
ANTICOAGULATION CONSULT NOTE  Pharmacy Consult for warfarin + heparin  Indication: atrial fibrillation  Allergies  Allergen Reactions   Celecoxib Other (See Comments)    Other reaction(s): GI Bleed, Other - See Comments GI BLEEDING Other Reaction: GI BLEEDING GI bleeding    Ciprofloxacin Anaphylaxis and Other (See Comments)    Other reaction(s): Other - See Comments Pt went into At Fib Other Reaction: PUT PT IN A FIB    Hydromorphone Nausea And Vomiting    Other reaction(s): Vomiting Nausea and vomiting    Oxycodone-Acetaminophen Nausea And Vomiting    Other reaction(s): Vomiting   Ranolazine Anaphylaxis    Patient describes abdominal swelling/bloating that radiates to neck/throat Patient describes abdominal swelling/bloating that radiates to neck/throat    Rivaroxaban Rash    Other reaction(s): Other (See Comments), Other - See Comments SEVERE RECTAL BLEED Sever rectal bleeding Sever rectal bleeding SEVERE RECTAL BLEED    Ezetimibe Diarrhea    Other reaction(s): cough, Other - See Comments Cough Cough    Apixaban    Dabigatran Etexilate Mesilate [Dabigatran]    Dilaudid [Hydromorphone Hcl]    Nitroglycerin    Prednisone    Tramadol     Patient Measurements: Height: 5' 4.02" (162.6 cm) Weight: 88 kg (194 lb) IBW/kg (Calculated) : 59.24 Heparin Dosing Weight: 88kg  Vital Signs: Temp: 97.9 F (36.6 C) (06/30 1229) Temp Source: Oral (06/30 1229) BP: 116/68 (06/30 1229) Pulse Rate: 65 (06/30 1229)  Labs: Recent Labs    08/01/21 0103 08/01/21 0401 08/01/21 0834 08/01/21 1540 08/01/21 2222 08/02/21 0013 08/02/21 0458  HGB 10.2*  --   --   --   --   --  9.6*  HCT 36.0*  --   --   --   --   --  33.3*  PLT 378  --   --   --   --   --  324  LABPROT  --  21.9*  --   --   --   --  22.5*  INR  --  1.9*  --   --   --   --  2.0*  HEPARINUNFRC  --   --   --   --  0.33  --  0.20*  CREATININE 0.96  --   --   --   --   --  1.06  TROPONINIHS 9 9   < > 8 9 8    --    < > = values in this interval not displayed.     Estimated Creatinine Clearance: 70.4 mL/min (by C-G formula based on SCr of 1.06 mg/dL).   Medical History: Past Medical History:  Diagnosis Date   Atrial fibrillation (HCC)    CHF (congestive heart failure) (HCC)    Diabetes mellitus without complication (HCC)    Factor 5 Leiden mutation, heterozygous (HCC)    Hypertension    Pacemaker     Medications:  PTA: Warfarin 7.5mg  every morning Last dose 6/28 in the morning Inpatient: Allergies: Apixaban (reaction not documented)  Assessment: [name] is a 66 y.o. male with history of atrial fibrillation on Coumadin, hypertension, diabetes, CHF that is post pacemaker, CAD status post 12 stents who presents to the emergency department with complaints of diffuse anterior chest pain.  Pharmacy consulted for inpatient management of warfarin and heparin in the setting of Atrial Fibrillation with possible cardiac procedure.  Date INR Warfarin Dose 6/28 ---- 7.5 mg 6/29 1.9 10 mg 6/30 2.0 9 mg  Goal of  Therapy:  INR 2-3 Monitor platelets by anticoagulation protocol: Yes   Plan:  Cardiology wants to restart heparin bridge and aim for a INR of 2.5 then d/c heparin. Restart heparin at 1300 units and give a 1500 unit bolus. Recheck heparin level in 6 hours. CBC daily while on heparin. Goal HL 0.3 to 0.7.   INR is therapeutic on the low-end of goal 2.0-3.0. Will give 9 mg x 1 (~ 30% of home dose) at 1500 today to help INR get to 2.5. Pt received 10 mg (~30% increase from home dose) on 6/29. Predict INR to continue to trend up 7/1 due to 10 mg dose. Daily INR ordered. Check INR with AM labs. Pt wants INR to be at 2.5.   Paschal Dopp, PharmD, BCPS 08/02/2021 1:27 PM

## 2022-01-24 ENCOUNTER — Emergency Department: Payer: Medicare HMO

## 2022-01-24 ENCOUNTER — Other Ambulatory Visit: Payer: Self-pay

## 2022-01-24 ENCOUNTER — Observation Stay: Payer: Medicare HMO

## 2022-01-24 ENCOUNTER — Inpatient Hospital Stay
Admission: EM | Admit: 2022-01-24 | Discharge: 2022-01-31 | DRG: 092 | Disposition: A | Discharge status: Other Acute Inpatient Hospital | Payer: Medicare HMO | Attending: Internal Medicine | Admitting: Internal Medicine

## 2022-01-24 DIAGNOSIS — Z885 Allergy status to narcotic agent status: Secondary | ICD-10-CM

## 2022-01-24 DIAGNOSIS — H5712 Ocular pain, left eye: Secondary | ICD-10-CM | POA: Diagnosis present

## 2022-01-24 DIAGNOSIS — Z955 Presence of coronary angioplasty implant and graft: Secondary | ICD-10-CM

## 2022-01-24 DIAGNOSIS — Z83438 Family history of other disorder of lipoprotein metabolism and other lipidemia: Secondary | ICD-10-CM

## 2022-01-24 DIAGNOSIS — Z87891 Personal history of nicotine dependence: Secondary | ICD-10-CM

## 2022-01-24 DIAGNOSIS — E1169 Type 2 diabetes mellitus with other specified complication: Secondary | ICD-10-CM | POA: Diagnosis present

## 2022-01-24 DIAGNOSIS — D869 Sarcoidosis, unspecified: Secondary | ICD-10-CM | POA: Diagnosis present

## 2022-01-24 DIAGNOSIS — H539 Unspecified visual disturbance: Secondary | ICD-10-CM

## 2022-01-24 DIAGNOSIS — I495 Sick sinus syndrome: Secondary | ICD-10-CM | POA: Diagnosis present

## 2022-01-24 DIAGNOSIS — Z881 Allergy status to other antibiotic agents status: Secondary | ICD-10-CM

## 2022-01-24 DIAGNOSIS — H5310 Unspecified subjective visual disturbances: Secondary | ICD-10-CM | POA: Diagnosis present

## 2022-01-24 DIAGNOSIS — Z7984 Long term (current) use of oral hypoglycemic drugs: Secondary | ICD-10-CM

## 2022-01-24 DIAGNOSIS — K5520 Angiodysplasia of colon without hemorrhage: Secondary | ICD-10-CM | POA: Diagnosis present

## 2022-01-24 DIAGNOSIS — R2681 Unsteadiness on feet: Secondary | ICD-10-CM | POA: Diagnosis present

## 2022-01-24 DIAGNOSIS — Z888 Allergy status to other drugs, medicaments and biological substances status: Secondary | ICD-10-CM

## 2022-01-24 DIAGNOSIS — R269 Unspecified abnormalities of gait and mobility: Secondary | ICD-10-CM

## 2022-01-24 DIAGNOSIS — Z7902 Long term (current) use of antithrombotics/antiplatelets: Secondary | ICD-10-CM

## 2022-01-24 DIAGNOSIS — Z79899 Other long term (current) drug therapy: Secondary | ICD-10-CM

## 2022-01-24 DIAGNOSIS — Z6833 Body mass index (BMI) 33.0-33.9, adult: Secondary | ICD-10-CM

## 2022-01-24 DIAGNOSIS — Z8249 Family history of ischemic heart disease and other diseases of the circulatory system: Secondary | ICD-10-CM

## 2022-01-24 DIAGNOSIS — R0602 Shortness of breath: Secondary | ICD-10-CM | POA: Diagnosis present

## 2022-01-24 DIAGNOSIS — R079 Chest pain, unspecified: Secondary | ICD-10-CM | POA: Diagnosis present

## 2022-01-24 DIAGNOSIS — K921 Melena: Secondary | ICD-10-CM | POA: Diagnosis present

## 2022-01-24 DIAGNOSIS — I509 Heart failure, unspecified: Secondary | ICD-10-CM | POA: Diagnosis present

## 2022-01-24 DIAGNOSIS — R700 Elevated erythrocyte sedimentation rate: Secondary | ICD-10-CM | POA: Diagnosis present

## 2022-01-24 DIAGNOSIS — I2510 Atherosclerotic heart disease of native coronary artery without angina pectoris: Secondary | ICD-10-CM | POA: Diagnosis present

## 2022-01-24 DIAGNOSIS — Z95810 Presence of automatic (implantable) cardiac defibrillator: Secondary | ICD-10-CM

## 2022-01-24 DIAGNOSIS — Z1152 Encounter for screening for COVID-19: Secondary | ICD-10-CM

## 2022-01-24 DIAGNOSIS — I482 Chronic atrial fibrillation, unspecified: Secondary | ICD-10-CM | POA: Diagnosis present

## 2022-01-24 DIAGNOSIS — E119 Type 2 diabetes mellitus without complications: Secondary | ICD-10-CM

## 2022-01-24 DIAGNOSIS — D682 Hereditary deficiency of other clotting factors: Secondary | ICD-10-CM | POA: Diagnosis present

## 2022-01-24 DIAGNOSIS — R072 Precordial pain: Secondary | ICD-10-CM | POA: Diagnosis present

## 2022-01-24 DIAGNOSIS — I480 Paroxysmal atrial fibrillation: Secondary | ICD-10-CM | POA: Diagnosis present

## 2022-01-24 DIAGNOSIS — I442 Atrioventricular block, complete: Secondary | ICD-10-CM | POA: Diagnosis present

## 2022-01-24 DIAGNOSIS — R519 Headache, unspecified: Secondary | ICD-10-CM | POA: Diagnosis present

## 2022-01-24 DIAGNOSIS — I110 Hypertensive heart disease with heart failure: Secondary | ICD-10-CM | POA: Diagnosis present

## 2022-01-24 DIAGNOSIS — I10 Essential (primary) hypertension: Secondary | ICD-10-CM | POA: Diagnosis present

## 2022-01-24 DIAGNOSIS — Z8673 Personal history of transient ischemic attack (TIA), and cerebral infarction without residual deficits: Secondary | ICD-10-CM

## 2022-01-24 DIAGNOSIS — E785 Hyperlipidemia, unspecified: Secondary | ICD-10-CM | POA: Diagnosis present

## 2022-01-24 DIAGNOSIS — Z7901 Long term (current) use of anticoagulants: Secondary | ICD-10-CM

## 2022-01-24 DIAGNOSIS — Z833 Family history of diabetes mellitus: Secondary | ICD-10-CM

## 2022-01-24 DIAGNOSIS — Z9079 Acquired absence of other genital organ(s): Secondary | ICD-10-CM

## 2022-01-24 DIAGNOSIS — E669 Obesity, unspecified: Secondary | ICD-10-CM | POA: Diagnosis present

## 2022-01-24 DIAGNOSIS — E1165 Type 2 diabetes mellitus with hyperglycemia: Secondary | ICD-10-CM | POA: Diagnosis present

## 2022-01-24 DIAGNOSIS — D6851 Activated protein C resistance: Secondary | ICD-10-CM | POA: Diagnosis present

## 2022-01-24 DIAGNOSIS — R270 Ataxia, unspecified: Principal | ICD-10-CM | POA: Diagnosis present

## 2022-01-24 HISTORY — DX: Sarcoidosis, unspecified: D86.9

## 2022-01-24 LAB — CBG MONITORING, ED: Glucose-Capillary: 86 mg/dL (ref 70–99)

## 2022-01-24 LAB — CBC WITH DIFFERENTIAL/PLATELET
Abs Immature Granulocytes: 0.02 10*3/uL (ref 0.00–0.07)
Basophils Absolute: 0.1 10*3/uL (ref 0.0–0.1)
Basophils Relative: 1 %
Eosinophils Absolute: 0.1 10*3/uL (ref 0.0–0.5)
Eosinophils Relative: 2 %
HCT: 41 % (ref 39.0–52.0)
Hemoglobin: 12.6 g/dL — ABNORMAL LOW (ref 13.0–17.0)
Immature Granulocytes: 0 %
Lymphocytes Relative: 21 %
Lymphs Abs: 1.2 10*3/uL (ref 0.7–4.0)
MCH: 26 pg (ref 26.0–34.0)
MCHC: 30.7 g/dL (ref 30.0–36.0)
MCV: 84.7 fL (ref 80.0–100.0)
Monocytes Absolute: 0.6 10*3/uL (ref 0.1–1.0)
Monocytes Relative: 11 %
Neutro Abs: 3.8 10*3/uL (ref 1.7–7.7)
Neutrophils Relative %: 65 %
Platelets: 173 10*3/uL (ref 150–400)
RBC: 4.84 MIL/uL (ref 4.22–5.81)
RDW: 14.8 % (ref 11.5–15.5)
WBC: 5.7 10*3/uL (ref 4.0–10.5)
nRBC: 0 % (ref 0.0–0.2)

## 2022-01-24 LAB — URINALYSIS, ROUTINE W REFLEX MICROSCOPIC
Bilirubin Urine: NEGATIVE
Glucose, UA: NEGATIVE mg/dL
Hgb urine dipstick: NEGATIVE
Ketones, ur: NEGATIVE mg/dL
Leukocytes,Ua: NEGATIVE
Nitrite: NEGATIVE
Protein, ur: NEGATIVE mg/dL
Specific Gravity, Urine: 1.006 (ref 1.005–1.030)
pH: 6 (ref 5.0–8.0)

## 2022-01-24 LAB — BASIC METABOLIC PANEL
Anion gap: 5 (ref 5–15)
BUN: 9 mg/dL (ref 8–23)
CO2: 24 mmol/L (ref 22–32)
Calcium: 8.9 mg/dL (ref 8.9–10.3)
Chloride: 107 mmol/L (ref 98–111)
Creatinine, Ser: 1.11 mg/dL (ref 0.61–1.24)
GFR, Estimated: >60 mL/min (ref >60)
Glucose, Bld: 147 mg/dL — ABNORMAL HIGH (ref 70–99)
Potassium: 3.9 mmol/L (ref 3.5–5.1)
Sodium: 136 mmol/L (ref 135–145)

## 2022-01-24 LAB — BRAIN NATRIURETIC PEPTIDE: B Natriuretic Peptide: 69.3 pg/mL (ref 0.0–100.0)

## 2022-01-24 LAB — TROPONIN I (HIGH SENSITIVITY)
Troponin I (High Sensitivity): 7 ng/L (ref <18)
Troponin I (High Sensitivity): 7 ng/L (ref <18)

## 2022-01-24 LAB — SEDIMENTATION RATE: Sed Rate: 5 mm/hr (ref 0–20)

## 2022-01-24 LAB — RESP PANEL BY RT-PCR (RSV, FLU A&B, COVID)  RVPGX2
Influenza A by PCR: NEGATIVE
Influenza B by PCR: NEGATIVE
Resp Syncytial Virus by PCR: NEGATIVE
SARS Coronavirus 2 by RT PCR: NEGATIVE

## 2022-01-24 LAB — PROTIME-INR
INR: 2.9 — ABNORMAL HIGH (ref 0.8–1.2)
Prothrombin Time: 29.8 seconds — ABNORMAL HIGH (ref 11.4–15.2)

## 2022-01-24 LAB — CK: Total CK: 317 U/L (ref 49–397)

## 2022-01-24 LAB — FOLATE: Folate: 8.2 ng/mL (ref >5.9)

## 2022-01-24 MED ORDER — PANTOPRAZOLE SODIUM 40 MG PO TBEC
40.0000 mg | DELAYED_RELEASE_TABLET | Freq: Two times a day (BID) | ORAL | Status: DC
  Administered 2022-01-24 – 2022-01-30 (×13): 40 mg via ORAL
  Filled 2022-01-24 (×13): qty 1

## 2022-01-24 MED ORDER — WARFARIN SODIUM 7.5 MG PO TABS
7.5000 mg | ORAL_TABLET | Freq: Every day | ORAL | Status: DC
  Administered 2022-01-25: 7.5 mg via ORAL
  Filled 2022-01-24 (×2): qty 1

## 2022-01-24 MED ORDER — NICOTINE POLACRILEX 2 MG MT GUM
4.0000 mg | CHEWING_GUM | OROMUCOSAL | Status: DC | PRN
  Administered 2022-01-27 – 2022-01-30 (×14): 4 mg via ORAL
  Filled 2022-01-24 (×15): qty 2

## 2022-01-24 MED ORDER — AMLODIPINE BESYLATE 5 MG PO TABS: 5.0000 mg | ORAL_TABLET | Freq: Two times a day (BID) | ORAL | Status: DC

## 2022-01-24 MED ORDER — MORPHINE SULFATE (PF) 4 MG/ML IV SOLN
4.0000 mg | Freq: Once | INTRAVENOUS | Status: AC
  Administered 2022-01-24: 4 mg via INTRAVENOUS
  Filled 2022-01-24: qty 1

## 2022-01-24 MED ORDER — ATORVASTATIN CALCIUM 20 MG PO TABS
80.0000 mg | ORAL_TABLET | Freq: Every day | ORAL | Status: DC
  Administered 2022-01-25 – 2022-01-30 (×6): 80 mg via ORAL
  Filled 2022-01-24 (×6): qty 4

## 2022-01-24 MED ORDER — ZOLPIDEM TARTRATE 5 MG PO TABS
10.0000 mg | ORAL_TABLET | Freq: Every day | ORAL | Status: DC
  Administered 2022-01-24 – 2022-01-30 (×7): 10 mg via ORAL
  Filled 2022-01-24 (×7): qty 2

## 2022-01-24 MED ORDER — MAGNESIUM OXIDE -MG SUPPLEMENT 400 (240 MG) MG PO TABS
400.0000 mg | ORAL_TABLET | Freq: Every day | ORAL | Status: DC
  Administered 2022-01-25 – 2022-01-30 (×6): 400 mg via ORAL
  Filled 2022-01-24 (×6): qty 1

## 2022-01-24 MED ORDER — OXYCODONE-ACETAMINOPHEN 5-325 MG PO TABS
1.0000 | ORAL_TABLET | Freq: Four times a day (QID) | ORAL | Status: DC | PRN
  Filled 2022-01-24: qty 1

## 2022-01-24 MED ORDER — SODIUM CHLORIDE 0.9% FLUSH
3.0000 mL | Freq: Two times a day (BID) | INTRAVENOUS | Status: DC
  Administered 2022-01-24 – 2022-01-30 (×10): 3 mL via INTRAVENOUS

## 2022-01-24 MED ORDER — FINASTERIDE 5 MG PO TABS
5.0000 mg | ORAL_TABLET | Freq: Every day | ORAL | Status: DC
  Administered 2022-01-25 – 2022-01-30 (×6): 5 mg via ORAL
  Filled 2022-01-24 (×6): qty 1

## 2022-01-24 MED ORDER — ALBUTEROL SULFATE (2.5 MG/3ML) 0.083% IN NEBU
2.5000 mg | INHALATION_SOLUTION | RESPIRATORY_TRACT | Status: DC | PRN
  Administered 2022-01-24: 2.5 mg via RESPIRATORY_TRACT
  Filled 2022-01-24: qty 3

## 2022-01-24 MED ORDER — ALBUTEROL SULFATE HFA 108 (90 BASE) MCG/ACT IN AERS: 2.0000 | INHALATION_SPRAY | RESPIRATORY_TRACT | Status: DC | PRN

## 2022-01-24 MED ORDER — INSULIN ASPART 100 UNIT/ML IJ SOLN: 0.0000 [IU] | Freq: Three times a day (TID) | INTRAMUSCULAR | Status: DC

## 2022-01-24 MED ORDER — POTASSIUM CHLORIDE CRYS ER 20 MEQ PO TBCR
20.0000 meq | EXTENDED_RELEASE_TABLET | Freq: Every day | ORAL | Status: DC
  Administered 2022-01-25 – 2022-01-30 (×6): 20 meq via ORAL
  Filled 2022-01-24 (×6): qty 1

## 2022-01-24 MED ORDER — WARFARIN - PHARMACIST DOSING INPATIENT
Freq: Every day | Status: DC
  Filled 2022-01-24: qty 1

## 2022-01-24 MED ORDER — IOHEXOL 350 MG/ML SOLN
75.0000 mL | Freq: Once | INTRAVENOUS | Status: AC | PRN
  Administered 2022-01-24: 75 mL via INTRAVENOUS

## 2022-01-24 MED ORDER — ALBUTEROL SULFATE (2.5 MG/3ML) 0.083% IN NEBU
2.5000 mg | INHALATION_SOLUTION | Freq: Once | RESPIRATORY_TRACT | Status: AC
  Administered 2022-01-24: 2.5 mg via RESPIRATORY_TRACT
  Filled 2022-01-24: qty 3

## 2022-01-24 MED ORDER — OXYCODONE HCL 5 MG PO TABS
5.0000 mg | ORAL_TABLET | Freq: Four times a day (QID) | ORAL | Status: DC | PRN
  Administered 2022-01-24: 5 mg via ORAL
  Filled 2022-01-24: qty 1

## 2022-01-24 MED ORDER — CLOPIDOGREL BISULFATE 75 MG PO TABS
75.0000 mg | ORAL_TABLET | Freq: Every day | ORAL | Status: DC
  Administered 2022-01-25 – 2022-01-30 (×6): 75 mg via ORAL
  Filled 2022-01-24 (×6): qty 1

## 2022-01-24 MED ORDER — OXYCODONE-ACETAMINOPHEN 10-325 MG PO TABS: 1.0000 | ORAL_TABLET | Freq: Four times a day (QID) | ORAL | Status: DC | PRN

## 2022-01-24 MED ORDER — GABAPENTIN 300 MG PO CAPS
600.0000 mg | ORAL_CAPSULE | Freq: Two times a day (BID) | ORAL | Status: DC
  Administered 2022-01-24 – 2022-01-25 (×2): 600 mg via ORAL
  Filled 2022-01-24 (×2): qty 2

## 2022-01-24 MED ORDER — INSULIN ASPART 100 UNIT/ML IJ SOLN: 0.0000 [IU] | Freq: Every day | INTRAMUSCULAR | Status: DC

## 2022-01-24 MED ORDER — LISINOPRIL 20 MG PO TABS
20.0000 mg | ORAL_TABLET | Freq: Every day | ORAL | Status: DC
  Administered 2022-01-26 – 2022-01-27 (×2): 20 mg via ORAL
  Filled 2022-01-24: qty 1
  Filled 2022-01-24: qty 2
  Filled 2022-01-24 (×2): qty 1

## 2022-01-24 MED ORDER — METOPROLOL SUCCINATE ER 25 MG PO TB24
25.0000 mg | ORAL_TABLET | Freq: Every day | ORAL | Status: DC
  Administered 2022-01-26 – 2022-01-28 (×3): 25 mg via ORAL
  Filled 2022-01-24 (×4): qty 1

## 2022-01-24 MED ORDER — SODIUM CHLORIDE 0.9 % IV BOLUS
500.0000 mL | Freq: Once | INTRAVENOUS | Status: AC
  Administered 2022-01-24: 500 mL via INTRAVENOUS

## 2022-01-24 NOTE — Assessment & Plan Note (Signed)
Check b12, foalte, f/u CTA head.All WNL Mri brain if able--has implantable defibrillator/pacemaker

## 2022-01-24 NOTE — H&P (Addendum)
History and Physical    Patient: [name] IEP:[id number] DOB: 11-Dec-1955 DOA: 01/24/2022 DOS: the patient was seen and examined on 01/24/2022 PCP: Almon Register, MD  Patient coming from: Home  Chief Complaint:  Chief Complaint  Patient presents with   Blurred Vision   Shortness of Breath   HPI: [name] is a 66 y.o. male with medical history significant of CAD and stenting and sarcoidosis (per patient, never treated) presents to [facility] with complaints of 4 days of left temporal pain as well as red ness of left eye as ewll as bluring of vision in left eye. Noprecipitating/relieving/agravating factors. No documented fever, but complain of fever. Patient is compalinting of whole body aches. Complain of dry cough, sensation of sob with exertion. Patient has chornic chest pain with exertion. Reports all extremity cramps/pain. No trauma. No vomiting. No abd pain, no diarrhea. Came to ER today due to non resolving symtpopms. Also reports unsteady gait X 4 days. No fall , no presyncope. No tremor.. no jaw claudication  Review of Systems: As mentioned in the history of present illness. All other systems reviewed and are negative. Past Medical History:  Diagnosis Date   Atrial fibrillation (HCC)    CHF (congestive heart failure) (HCC)    Diabetes mellitus without complication (HCC)    Factor 5 Leiden mutation, heterozygous (HCC)    Hypertension    Pacemaker    Past Surgical History:  Procedure Laterality Date   CARDIAC SURGERY     HERNIA REPAIR     TRANSURETHRAL RESECTION OF PROSTATE     Social History:  reports that he has quit smoking. He has never used smokeless tobacco. He reports that he does not drink alcohol and does not use drugs.  Allergies  Allergen Reactions   Celecoxib Other (See Comments)    Other reaction(s): GI Bleed, Other - See Comments GI BLEEDING Other Reaction: GI BLEEDING GI bleeding    Ciprofloxacin Anaphylaxis and Other (See Comments)     Other reaction(s): Other - See Comments Pt went into At Fib Other Reaction: PUT PT IN A FIB    Dicyclomine     Other reaction(s): chest pain   Hydromorphone Nausea And Vomiting    Other reaction(s): Vomiting  Nausea and vomiting   Oxycodone-Acetaminophen Nausea And Vomiting    Other reaction(s): Vomiting   Ranolazine Anaphylaxis    Patient describes abdominal swelling/bloating that radiates to neck/throat Patient describes abdominal swelling/bloating that radiates to neck/throat    Rivaroxaban Rash    Other reaction(s): Other (See Comments), Other - See Comments SEVERE RECTAL BLEED Sever rectal bleeding Sever rectal bleeding SEVERE RECTAL BLEED    Ezetimibe Diarrhea    Other reaction(s): cough, Other - See Comments Cough Cough    Pregabalin Other (See Comments)    "weight gain"  Weight gain   Apixaban    Dabigatran Etexilate Mesilate [Dabigatran]    Dabigatran Etexilate Mesylate Other (See Comments)   Dilaudid [Hydromorphone Hcl]    Fentanyl     Nauseated and makes pt jittery   Nauseated and makes pt jittery   Nitroglycerin    Other     Other reaction(s): Hives, Itching Pt states he breaks out in red bumps with adhesive materials (ex: patches)   Prednisone    Tramadol    Patinet reports developing shortnss of breath with prednisone about 15 years ago. Family History  Problem Relation Age of Onset   Diabetes Mother    CAD Mother  Hypertension Mother    CAD Father    Hyperlipidemia Father    Hypertension Father     Prior to Admission medications   Medication Sig Start Date End Date Taking? Authorizing Provider  albuterol (VENTOLIN HFA) 108 (90 Base) MCG/ACT inhaler Inhale 2 puffs into the lungs every 4 (four) hours as needed. 05/09/21   [provider]  amLODipine (NORVASC) 5 MG tablet Take 5 mg by mouth 2 (two) times daily. 07/26/21   [provider]  atorvastatin (LIPITOR) 80 MG tablet Take 80 mg by mouth daily.    [provider]  clopidogrel (PLAVIX) 75 MG tablet Take 75 mg by mouth daily. 04/29/19   [provider]  finasteride (PROSCAR) 5 MG tablet Take 5 mg by mouth daily. 05/20/21   [provider]  gabapentin (NEURONTIN) 600 MG tablet Take 600 mg by mouth in the morning, at noon, and at bedtime. 02/21/21   [provider]  ipratropium-albuterol (DUONEB) 0.5-2.5 (3) MG/3ML SOLN Take 3 mLs by nebulization every 6 (six) hours. 05/07/21   [provider]  lisinopril (ZESTRIL) 20 MG tablet Take 20 mg by mouth daily. 05/31/21   [provider]  magnesium oxide (MAG-OX) 400 (240 Mg) MG tablet Take 400 mg by mouth daily.    [provider]  metFORMIN (GLUCOPHAGE) 500 MG tablet Take by mouth 2 (two) times daily with a meal. Patient not taking: Reported on 08/01/2021    [provider]  metoprolol succinate (TOPROL-XL) 50 MG 24 hr tablet Take 50 mg by mouth daily.    [provider]  nicotine polacrilex (NICORETTE) 4 MG gum Take 4 mg by mouth as needed for smoking cessation.    [provider]  oxyCODONE-acetaminophen (PERCOCET) 10-325 MG tablet Take 1 tablet by mouth every 6 (six) hours as needed for pain.    [provider]  pantoprazole (PROTONIX) 40 MG tablet Take 40 mg by mouth 2 (two) times daily.    [provider]  potassium chloride SA (KLOR-CON M20) 20 MEQ tablet Take 20 mEq by mouth daily. 11/25/19   [provider]  warfarin (COUMADIN) 7.5 MG tablet Take 7.5 mg by mouth daily.    [provider]  zolpidem (AMBIEN) 10 MG tablet Take 10 mg by mouth at bedtime.    [provider]    Physical Exam: Vitals:   01/24/22 1730 01/24/22 1853 01/24/22 1900 01/24/22 1930  BP: 121/73  125/72 136/85  Pulse: (!) 58  (!) 57 62  Resp: (!) 22  20 (!) 24  Temp:  98.5 F (36.9 C)    TempSrc:  Oral    SpO2: 95%  97% 96%  Weight:      Height:       No distress.  Neuro AAOx3. No focal motor  deficit Resp occasional right lower lung field craklce. Cvs -s1s2 nromal Abdomen soft nontender Extremities warm no edema On examination of scalp there is tenderness overlying the left temporal artery.  This is marked Visual acuity  was not checked by me personally however ER has already checked it and it is 20 x 30 at the left eye. Did check all the gazes and they are preserved.  Pupils are bilaterally equal and reactive to light and round Data Reviewed:  There are no new results to review at this time. Results for orders placed or performed during the hospital encounter of 01/24/22 (from the past 24 hour(s))  CBC with Differential  Status: Abnormal   Collection Time: 01/24/22  2:59 PM  Result Value Ref Range   WBC 5.7 4.0 - 10.5 K/uL   RBC 4.84 4.22 - 5.81 MIL/uL   Hemoglobin 12.6 (L) 13.0 - 17.0 g/dL   HCT 45.441.0 09.839.0 - 11.952.0 %   MCV 84.7 80.0 - 100.0 fL   MCH 26.0 26.0 - 34.0 pg   MCHC 30.7 30.0 - 36.0 g/dL   RDW 14.714.8 82.911.5 - 56.215.5 %   Platelets 173 150 - 400 K/uL   nRBC 0.0 0.0 - 0.2 %   Neutrophils Relative % 65 %   Neutro Abs 3.8 1.7 - 7.7 K/uL   Lymphocytes Relative 21 %   Lymphs Abs 1.2 0.7 - 4.0 K/uL   Monocytes Relative 11 %   Monocytes Absolute 0.6 0.1 - 1.0 K/uL   Eosinophils Relative 2 %   Eosinophils Absolute 0.1 0.0 - 0.5 K/uL   Basophils Relative 1 %   Basophils Absolute 0.1 0.0 - 0.1 K/uL   Immature Granulocytes 0 %   Abs Immature Granulocytes 0.02 0.00 - 0.07 K/uL  Basic metabolic panel     Status: Abnormal   Collection Time: 01/24/22  2:59 PM  Result Value Ref Range   Sodium 136 135 - 145 mmol/L   Potassium 3.9 3.5 - 5.1 mmol/L   Chloride 107 98 - 111 mmol/L   CO2 24 22 - 32 mmol/L   Glucose, Bld 147 (H) 70 - 99 mg/dL   BUN 9 8 - 23 mg/dL   Creatinine, Ser 1.301.11 0.61 - 1.24 mg/dL   Calcium 8.9 8.9 - 86.510.3 mg/dL   GFR, Estimated >78>60 >46>60 mL/min   Anion gap 5 5 - 15  Brain natriuretic peptide     Status: None   Collection Time: 01/24/22  2:59 PM   Result Value Ref Range   B Natriuretic Peptide 69.3 0.0 - 100.0 pg/mL  Troponin I (High Sensitivity)     Status: None   Collection Time: 01/24/22  2:59 PM  Result Value Ref Range   Troponin I (High Sensitivity) 7 <18 ng/L  Protime-INR     Status: Abnormal   Collection Time: 01/24/22  2:59 PM  Result Value Ref Range   Prothrombin Time 29.8 (H) 11.4 - 15.2 seconds   INR 2.9 (H) 0.8 - 1.2  Sedimentation rate     Status: None   Collection Time: 01/24/22  6:27 PM  Result Value Ref Range   Sed Rate 5 0 - 20 mm/hr  Troponin I (High Sensitivity)     Status: None   Collection Time: 01/24/22  6:27 PM  Result Value Ref Range   Troponin I (High Sensitivity) 7 <18 ng/L  Resp panel by RT-PCR (RSV, Flu A&B, Covid) Anterior Nasal Swab     Status: None   Collection Time: 01/24/22  6:27 PM   Specimen: Anterior Nasal Swab  Result Value Ref Range   SARS Coronavirus 2 by RT PCR NEGATIVE NEGATIVE   Influenza A by PCR NEGATIVE NEGATIVE   Influenza B by PCR NEGATIVE NEGATIVE   Resp Syncytial Virus by PCR NEGATIVE NEGATIVE  Urinalysis, Routine w reflex microscopic     Status: Abnormal   Collection Time: 01/24/22  6:27 PM  Result Value Ref Range   Color, Urine STRAW (A) YELLOW   APPearance CLEAR (A) CLEAR   Specific Gravity, Urine 1.006 1.005 - 1.030   pH 6.0 5.0 - 8.0   Glucose, UA NEGATIVE NEGATIVE mg/dL   Hgb urine  dipstick NEGATIVE NEGATIVE   Bilirubin Urine NEGATIVE NEGATIVE   Ketones, ur NEGATIVE NEGATIVE mg/dL   Protein, ur NEGATIVE NEGATIVE mg/dL   Nitrite NEGATIVE NEGATIVE   Leukocytes,Ua NEGATIVE NEGATIVE   CXR - IMPRESSION: No active cardiopulmonary disease.    CT head - IMPRESSION: No acute finding or explanation for symptoms.  Assessment and Plan: Ataxia Mri brain.  Unsteady gait when walking Check b12, foalte, f/u CTA head.  Shortness of breath Ambulatory pulse ox  Subjective visual disturbance, left eye I have left a voicemail chart message for ophthalmologist on  call we will follow-up their advice.  Left temporal headache With associated left temporal area tenderness.  I will also check a CRP as well as an ultrasound of the left temporal artery.  Patient's care is complicated by the fact that he reports a severe allergy to prednisone administration.  Patient has a CAT scan angiography ordered, will follow-up to see if that shows any vascular abnormalities.   Concern for temporal arteriitis remains low-intermediate (rule out stage) given several atypical symptoms of red eye, ataxia, etc as well as negative sed rate thus far, no fever.. Therefore, will wait for additionaal diagnostic work up as above before offering empriic steroid therapy (likely dexamethasone). I discussed with patient in detail.   Advance Care Planning:   Code Status: Prior full  Consults: opthal  Family Communication:   Severity of Illness: The appropriate patient status for this patient is OBSERVATION. Observation status is judged to be reasonable and necessary in order to provide the required intensity of service to ensure the patient's safety. The patient's presenting symptoms, physical exam findings, and initial radiographic and laboratory data in the context of their medical condition is felt to place them at decreased risk for further clinical deterioration. Furthermore, it is anticipated that the patient will be medically stable for discharge from the hospital within 2 midnights of admission.   Author: Nolberto Hanlon, MD 01/24/2022 8:55 PM  For on call review [email or web address].

## 2022-01-24 NOTE — ED Provider Notes (Signed)
[facility] Provider Note    Event Date/Time   First MD Initiated Contact with Patient 01/24/22 1642     (approximate)   History   Blurred Vision and Shortness of Breath   HPI {Remember to add pertinent medical, surgical, social, and/or OB history to HPI:1} [name] is a 66 y.o. male  ***with a history of CAD, atrial fibrillation on Coumadin, diabetes, CHF, hypertension, and hyperlipidemia who presents with  I reviewed the past medical records.  The patient was most recently admitted in June.  Per the hospitalist discharge summary from 6/30 he presented with chest pain and was ruled out for ACS.   Physical Exam   Triage Vital Signs: ED Triage Vitals  Enc Vitals Group     BP 01/24/22 1450 139/80     Pulse Rate 01/24/22 1450 73     Resp 01/24/22 1450 18     Temp 01/24/22 1450 98.2 F (36.8 C)     Temp Source 01/24/22 1450 Oral     SpO2 01/24/22 1450 96 %     Weight 01/24/22 1454 194 lb (88 kg)     Height 01/24/22 1454 5\' 4"  (1.626 m)     Head Circumference --      Peak Flow --      Pain Score 01/24/22 1453 7     Pain Loc --      Pain Edu? --      Excl. in [place]? --     Most recent vital signs: Vitals:   01/24/22 1450 01/24/22 1711  BP: 139/80 (!) 123/92  Pulse: 73 67  Resp: 18 15  Temp: 98.2 F (36.8 C)   SpO2: 96% 99%    {Only need to document appropriate and relevant physical exam:1} General: Awake, no distress. *** CV:  Good peripheral perfusion. *** Resp:  Normal effort. *** Abd:  No distention. *** Other:  ***   ED Results / Procedures / Treatments   Labs (all labs ordered are listed, but only abnormal results are displayed) Labs Reviewed  CBC WITH DIFFERENTIAL/PLATELET - Abnormal; Notable for the following components:      Result Value   Hemoglobin 12.6 (*)    All other components within normal limits  BASIC METABOLIC PANEL - Abnormal; Notable for the following components:   Glucose, Bld 147 (*)    All other  components within normal limits  PROTIME-INR - Abnormal; Notable for the following components:   Prothrombin Time 29.8 (*)    INR 2.9 (*)    All other components within normal limits  BRAIN NATRIURETIC PEPTIDE  SEDIMENTATION RATE  TROPONIN I (HIGH SENSITIVITY)  TROPONIN I (HIGH SENSITIVITY)     EKG  ED ECG REPORT I, Arta Silence, the attending physician, personally viewed and interpreted this ECG.  Date: 01/24/2022 EKG Time: 1524 Rate: 73 Rhythm: normal sinus rhythm QRS Axis: normal Intervals: normal ST/T Wave abnormalities: nonspecific ST abnormalities Narrative Interpretation: no evidence of acute ischemia; no significant change when compared to EKG of 08/01/21   RADIOLOGY  Chest x-ray: I independently viewed and interpreted the images; there is no focal consolidation or edema  CT head: No ICH or other acute abnormality  PROCEDURES:  Critical Care performed: {CriticalCareYesNo:19197::"Yes, see critical care procedure note(s)","No"}  Procedures   MEDICATIONS ORDERED IN ED: Medications - No data to display   IMPRESSION / MDM / [place] / ED COURSE  I reviewed the triage vital signs and the nursing notes.  Differential diagnosis includes, but is not limited to, ***  Patient's presentation is most consistent with {EM COPA:27473}  *** {If the patient is on the monitor, remove the brackets and asterisks on the sentence below and remember to document it as a Procedure as well. Otherwise delete the sentence below:1} {**The patient is on the cardiac monitor to evaluate for evidence of arrhythmia and/or significant heart rate changes.**} {Remember to include, when applicable, any/all of the following data: independent review of imaging independent review of labs (comment specifically on pertinent positives and negatives) review of specific prior hospitalizations, PCP/specialist notes, etc. discuss meds given and  prescribed document any discussion with consultants (including hospitalists) any clinical decision tools you used and why (PECARN, NEXUS, etc.) did you consider admitting the patient? document social determinants of health affecting patient's care (homelessness, inability to follow up in a timely fashion, etc) document any pre-existing conditions increasing risk on current visit (e.g. diabetes and HTN increasing danger of high-risk chest pain/ACS) describes what meds you gave (especially parenteral) and why any other interventions?:1}  ----------------------------------------- 8:27 PM on 01/24/2022 -----------------------------------------  Consulted hospitalist for admission   FINAL CLINICAL IMPRESSION(S) / ED DIAGNOSES   Final diagnoses:  None     Rx / DC Orders   ED Discharge Orders     None        Note:  This document was prepared using Dragon voice recognition software and may include unintentional dictation errors.

## 2022-01-24 NOTE — Progress Notes (Signed)
ANTICOAGULATION CONSULT NOTE - Initial Consult  Pharmacy Consult for Warfarin  Indication: atrial fibrillation  Allergies  Allergen Reactions   Celecoxib Other (See Comments)    Other reaction(s): GI Bleed, Other - See Comments GI BLEEDING Other Reaction: GI BLEEDING GI bleeding    Ciprofloxacin Anaphylaxis and Other (See Comments)    Other reaction(s): Other - See Comments Pt went into At Fib Other Reaction: PUT PT IN A FIB    Dicyclomine     Other reaction(s): chest pain   Hydromorphone Nausea And Vomiting    Other reaction(s): Vomiting  Nausea and vomiting   Oxycodone-Acetaminophen Nausea And Vomiting    Other reaction(s): Vomiting   Ranolazine Anaphylaxis    Patient describes abdominal swelling/bloating that radiates to neck/throat Patient describes abdominal swelling/bloating that radiates to neck/throat    Rivaroxaban Rash    Other reaction(s): Other (See Comments), Other - See Comments SEVERE RECTAL BLEED Sever rectal bleeding Sever rectal bleeding SEVERE RECTAL BLEED    Ezetimibe Diarrhea    Other reaction(s): cough, Other - See Comments Cough Cough    Pregabalin Other (See Comments)    "weight gain"  Weight gain   Apixaban    Dabigatran Etexilate Mesilate [Dabigatran]    Dabigatran Etexilate Mesylate Other (See Comments)   Dilaudid [Hydromorphone Hcl]    Fentanyl     Nauseated and makes pt jittery   Nauseated and makes pt jittery   Nitroglycerin    Other     Other reaction(s): Hives, Itching Pt states he breaks out in red bumps with adhesive materials (ex: patches)   Prednisone    Tramadol     Patient Measurements: Height: 5\' 4"  (162.6 cm) Weight: 88 kg (194 lb) IBW/kg (Calculated) : 59.2 Heparin Dosing Weight:   Vital Signs: Temp: 98.5 F (36.9 C) (12/22 1853) Temp Source: Oral (12/22 1853) BP: 128/80 (12/22 2100) Pulse Rate: 67 (12/22 2100)  Labs: Recent Labs    01/24/22 1459 01/24/22 1827  HGB 12.6*  --   HCT 41.0  --   PLT  173  --   LABPROT 29.8*  --   INR 2.9*  --   CREATININE 1.11  --   CKTOTAL  --  317  TROPONINIHS 7 7    Estimated Creatinine Clearance: 67.2 mL/min (by C-G formula based on SCr of 1.11 mg/dL).   Medical History: Past Medical History:  Diagnosis Date   Atrial fibrillation (HCC)    CHF (congestive heart failure) (HCC)    Diabetes mellitus without complication (HCC)    Factor 5 Leiden mutation, heterozygous (HCC)    Hypertension    Pacemaker    Sarcoidosis     Medications:  (Not in a hospital admission)   Assessment: Pharmacy consulted to dose warfarin for Afib in this 66 year old male admitted for possible stroke.  Pt was on warfarin 7.5 mg PO daily PTA, last dose on 12/22.    12/22:  INR @ 1459 = 2.9  - Of Note:  Pt states he has "home INR machine" and checks and adjusts his home warfarin dose based on readings from home INR machine.   Pt needs to be counseled regarding appropriateness of this practice.   Goal of Therapy:  INR 2-3   Plan:  INR on 12/22 is therapeutic at 2.9 Will continue pt on home warfarin dose of 7.5 mg PO daily.   - Will need to counsel pt regarding dangers of self-titration of warfarin dose based on readings from "home INR machine" .  [name] D 01/24/2022,10:28 PM

## 2022-01-24 NOTE — Assessment & Plan Note (Addendum)
Likely related to A. Fib with [facility] Cardiology consulted

## 2022-01-24 NOTE — ED Triage Notes (Addendum)
Reports 3 days ago started having left sided head pain/eye pain.  Sob, back pain and staggering gait per patient.  Gait was normal when walking into triage.  Patient complains of pain with movement to left eye and forehead. Patient reports he is on blood thinners. Hx of sarcoidosis per patient and reports trouble breathing.  Patients left eyebrow is turning downward and left eye there is blurred vision.

## 2022-01-24 NOTE — Assessment & Plan Note (Signed)
With associated left temporal area tenderness.  I will also check a CRP as well as an ultrasound of the left temporal artery.  Patient's care is complicated by the fact that he reports a severe allergy to prednisone administration.  Patient has a CAT scan angiography ordered, will follow-up to see if that shows any vascular abnormalities.

## 2022-01-24 NOTE — Assessment & Plan Note (Signed)
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

## 2022-01-25 ENCOUNTER — Observation Stay: Payer: Medicare HMO

## 2022-01-25 ENCOUNTER — Encounter: Payer: Self-pay | Admitting: Internal Medicine

## 2022-01-25 DIAGNOSIS — R270 Ataxia, unspecified: Secondary | ICD-10-CM | POA: Diagnosis not present

## 2022-01-25 LAB — PROTIME-INR
INR: 2.5 — ABNORMAL HIGH (ref 0.8–1.2)
Prothrombin Time: 26.8 seconds — ABNORMAL HIGH (ref 11.4–15.2)

## 2022-01-25 LAB — C-REACTIVE PROTEIN: CRP: 1.1 mg/dL — ABNORMAL HIGH (ref <1.0)

## 2022-01-25 LAB — VITAMIN B12: Vitamin B-12: 1009 pg/mL — ABNORMAL HIGH (ref 180–914)

## 2022-01-25 LAB — CBG MONITORING, ED: Glucose-Capillary: 119 mg/dL — ABNORMAL HIGH (ref 70–99)

## 2022-01-25 LAB — CBC
HCT: 38.5 % — ABNORMAL LOW (ref 39.0–52.0)
Hemoglobin: 11.9 g/dL — ABNORMAL LOW (ref 13.0–17.0)
MCH: 26.3 pg (ref 26.0–34.0)
MCHC: 30.9 g/dL (ref 30.0–36.0)
MCV: 85 fL (ref 80.0–100.0)
Platelets: 158 10*3/uL (ref 150–400)
RBC: 4.53 MIL/uL (ref 4.22–5.81)
RDW: 14.8 % (ref 11.5–15.5)
WBC: 5.6 10*3/uL (ref 4.0–10.5)
nRBC: 0 % (ref 0.0–0.2)

## 2022-01-25 MED ORDER — VALACYCLOVIR HCL 500 MG PO TABS
1000.0000 mg | ORAL_TABLET | Freq: Three times a day (TID) | ORAL | Status: DC
  Administered 2022-01-25 – 2022-01-26 (×2): 1000 mg via ORAL
  Filled 2022-01-25 (×2): qty 2

## 2022-01-25 MED ORDER — MORPHINE SULFATE (PF) 2 MG/ML IV SOLN
2.0000 mg | Freq: Once | INTRAVENOUS | Status: AC
  Administered 2022-01-25: 2 mg via INTRAVENOUS
  Filled 2022-01-25: qty 1

## 2022-01-25 MED ORDER — ONDANSETRON HCL 4 MG/2ML IJ SOLN
4.0000 mg | Freq: Four times a day (QID) | INTRAMUSCULAR | Status: DC | PRN
  Administered 2022-01-25 – 2022-01-30 (×6): 4 mg via INTRAVENOUS
  Filled 2022-01-25 (×6): qty 2

## 2022-01-25 MED ORDER — AMLODIPINE BESYLATE 5 MG PO TABS
5.0000 mg | ORAL_TABLET | Freq: Two times a day (BID) | ORAL | Status: DC
  Administered 2022-01-25 – 2022-01-30 (×11): 5 mg via ORAL
  Filled 2022-01-25 (×13): qty 1

## 2022-01-25 MED ORDER — MORPHINE SULFATE (PF) 4 MG/ML IV SOLN
4.0000 mg | Freq: Once | INTRAVENOUS | Status: AC
  Administered 2022-01-25: 4 mg via INTRAVENOUS
  Filled 2022-01-25: qty 1

## 2022-01-25 MED ORDER — PREGABALIN 75 MG PO CAPS
75.0000 mg | ORAL_CAPSULE | Freq: Two times a day (BID) | ORAL | Status: DC
  Administered 2022-01-25 – 2022-01-26 (×3): 75 mg via ORAL
  Filled 2022-01-25 (×3): qty 1

## 2022-01-25 MED ORDER — VALACYCLOVIR HCL 500 MG PO TABS
1000.0000 mg | ORAL_TABLET | Freq: Two times a day (BID) | ORAL | Status: DC
  Administered 2022-01-25: 1000 mg via ORAL
  Filled 2022-01-25 (×2): qty 2

## 2022-01-25 MED ORDER — ARTIFICIAL TEARS OPHTHALMIC OINT
TOPICAL_OINTMENT | Freq: Three times a day (TID) | OPHTHALMIC | Status: DC
  Filled 2022-01-25: qty 3.5

## 2022-01-25 MED ORDER — ACETAMINOPHEN 325 MG PO TABS: 650.0000 mg | ORAL_TABLET | Freq: Four times a day (QID) | ORAL | Status: DC | PRN

## 2022-01-25 MED ORDER — LIDOCAINE 5 % EX PTCH
1.0000 | MEDICATED_PATCH | CUTANEOUS | Status: DC
  Administered 2022-01-25 – 2022-01-30 (×4): 1 via TRANSDERMAL
  Filled 2022-01-25 (×6): qty 1

## 2022-01-25 MED ORDER — MORPHINE SULFATE 15 MG PO TABS
15.0000 mg | ORAL_TABLET | Freq: Four times a day (QID) | ORAL | Status: DC | PRN
  Filled 2022-01-25: qty 1

## 2022-01-25 NOTE — ED Notes (Signed)
Patient reports headache, dizziness, and blurred vision in left eye. Patient reports pain in left lateral head.

## 2022-01-25 NOTE — ED Notes (Signed)
Request made for transport to the floor ?

## 2022-01-25 NOTE — Progress Notes (Signed)
[facility] Progress Note  Patient: [name]    WNI:[id number]  DOA: 01/24/2022     Date of Service: the patient was seen and examined on 01/25/2022  Chief Complaint  Patient presents with   Blurred Vision   Shortness of Breath   Brief hospital course: [name] is a 66 y.o. 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  Patient woke up with burning of left eye and felt tenderness in the left temporal area for the past 4 days which is gradually worsening.  Patient had some blurry vision which has improved.  Patient had fever at home 2 days ago.  Patient also complaining of bilateral chest wall pain and tenderness mostly on the right upper chest area of PPM insertion.  Abdominal pain especially in the lower abdomen, also noticed some black tarry stools for past few days, noticed some blood on wiping and abdominal pain after BM.  Patient is feeling sensation of being drunk and tipsy, does not drink.  Patient is also experiencing unsteady gait, wobbly and drunken gait for past few days. ED workup: Troponin negative x 2, CK3 17, BNP 69 within normal range Serum glucose 147, mild hyperglycemia, CRP 1.1 slightly elevated, vitamin B12 1009 elevated ESR 5, does not meet criteria for temporal arteritis CT Head: No acute finding or explanation for symptoms.  CTA H/N: 1. Negative CTA for large vessel occlusion or other emergent finding. 2. Atheromatous change about the carotid siphons with up to moderate stenoses bilaterally. 3. Mild atheromatous irregularity elsewhere about the major arterial vasculature of the head and neck. No other hemodynamically significant or correctable stenosis. CT A/P: No acute findings  within the abdomen or pelvis.  Aortic atherosclerosis.    Assessment and Plan:  Left temporal pain, could be secondary to viral infection, presented here seems like neuropathy, no vesicles. Less likely temporal artery arteritis Discontinued gabapentin, started Lyrica 75 mg p.o. twice daily Started Valtrex empirically Started lidocaine patch topical Follow serum HSV and CMV Patient stated that he is allergic to prednisone, being sick in the past after taking prednisone but does not seem to be true allergy.  Patient agrees to try again if needs to Consider starting dexamethasone if no improvement after above treatment As per H&P message was sent to ophthalmology I sent a secure chat text to ophthalmology as well, follow recommendation.   Ataxia, unknown cause could be due to gabapentin CT head and CTA head and neck negative for any acute findings Repeat CT scan after 24 hours MRI cannot be done due to PPM Curbside discussion done with neurology Dr. Su Monks, agreed with above management plan. 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 twice daily H&H stable, continue to monitor Hb daily History of black tarry stools and bleeding when wipes Check FOBT Consider GI consult if H&H drops and persistent bleeding   History of sarcoidosis, never treated.  Patient was recommended to follow as an outpatient.   Body mass index is 33.3 kg/m.  Interventions:       Diet: Regular diet DVT Prophylaxis: Therapeutic Anticoagulation with Coumadin    Advance goals of care discussion: Full code  Family Communication: family was not present at  bedside, at the time of interview.  The pt provided permission to discuss medical plan with the family. Opportunity was given to ask question and all questions were answered satisfactorily.   Disposition:  Pt is from Home, admitted with Left temporal pain and left eye burning sensation, ataxic gait, black tarry stools, abdominal pain, still has same complaints, which precludes a safe discharge. Discharge to home, when medically stable, may need 2 to 3 days.  Subjective: Patient was admitted overnight due to left temporal pain and left eye burning sensation, still has same complaint, patient has complaining of lower abdominal pain and right-sided chest wall tenderness and pain across.  Above management plan started, counseling was done to stay for further workup and management. MRI cannot be done due to PPM so we will repeat CT head after 24 hours, which will be done today around 5 PM.   Physical Exam: General:  alert oriented to time, place, and person.  Appear in mild distress, affect appropriate Eyes: PERRLA ENT: Oral Mucosa Clear, moist  Neck: no JVD,  Cardiovascular: S1 and S2 Present, no Murmur,  Respiratory: good respiratory effort, Bilateral Air entry equal and Decreased, no Crackles, no wheezes Abdomen: Bowel Sound present, Soft and mild lower abd tenderness,  Skin: no rashes or vesicles, mild erythema and tenderness on the left temporal area Extremities: no Pedal edema, no calf tenderness Neurologic: without any new focal findings Gait not checked due to patient safety concerns  Vitals:   01/25/22 0130 01/25/22 0505 01/25/22 1046 01/25/22 1053  BP: 131/85 (!) 113/55 114/68 114/68  Pulse: 72 60 61   Resp: 19 17    Temp:  (!) 97.5 F (36.4 C)    TempSrc:  Oral    SpO2: 95% 95% 99%   Weight:      Height:        Intake/Output Summary (Last 24 hours) at 01/25/2022 1513 Last data filed at 01/25/2022 0516 Gross per 24 hour  Intake 500 ml  Output 340 ml  Net 160 ml    Filed Weights   01/24/22 1454  Weight: 88 kg    Data Reviewed: I have personally reviewed and interpreted daily labs, tele strips, imagings as discussed above. I reviewed all nursing notes, pharmacy notes, vitals, pertinent old records I have discussed plan of care as described above with RN and patient/family.  CBC: Recent Labs  Lab 01/24/22 1459 01/25/22 0502  WBC 5.7 5.6  NEUTROABS 3.8  --   HGB 12.6* 11.9*  HCT 41.0 38.5*  MCV 84.7 85.0  PLT 173 858   Basic Metabolic Panel: Recent Labs  Lab 01/24/22 1459  NA 136  K 3.9  CL 107  CO2 24  GLUCOSE 147*  BUN 9  CREATININE 1.11  CALCIUM 8.9    Studies: CT ABDOMEN PELVIS WO CONTRAST  Result Date: 01/25/2022 CLINICAL DATA:  Abdominal pain. EXAM: CT ABDOMEN AND PELVIS WITHOUT CONTRAST TECHNIQUE: Multidetector CT imaging of the abdomen and pelvis was performed following the standard protocol without IV contrast. RADIATION DOSE REDUCTION: This exam was performed according to the departmental dose-optimization program which includes automated exposure control, adjustment of the mA and/or kV according to patient size and/or use  of iterative reconstruction technique. COMPARISON:  06/25/2012. FINDINGS: Lower chest: Mild stable scarring or subsegmental atelectasis at the left lung base. Lung bases otherwise clear. Hepatobiliary: Normal liver. Gallbladder decompressed. No bile duct dilation. Pancreas: Unremarkable. No pancreatic ductal dilatation or surrounding inflammatory changes. Spleen: Normal in size without focal abnormality. Adrenals/Urinary Tract: Normal adrenal glands. Kidneys normal in size, orientation and position. 1.2 cm low-attenuation mass from the right kidney upper pole, consistent with a cyst. No follow-up recommended. No other renal masses, no stones and no hydronephrosis. Normal ureters. Bladder decompressed. Stomach/Bowel: Stomach moderately distended, otherwise unremarkable. Small bowel and colon are normal in  caliber. No wall thickening. No inflammation. Normal appendix visualized. Vascular/Lymphatic: Aortic atherosclerosis. No aneurysm. No enlarged lymph nodes. Reproductive: Unremarkable. Other: No abdominal wall hernia or abnormality. No abdominopelvic ascites. Musculoskeletal: No fracture or acute finding.  No bone lesion. IMPRESSION: 1. No acute findings within the abdomen or pelvis. 2. Aortic atherosclerosis. Electronically Signed   By: Lajean Manes M.D.   On: 01/25/2022 12:14   CT ANGIO HEAD NECK W WO CM  Result Date: 01/24/2022 CLINICAL DATA:  Initial evaluation for neuro deficit, stroke suspected, ataxia with blurry vision. EXAM: CT ANGIOGRAPHY HEAD AND NECK TECHNIQUE: Multidetector CT imaging of the head and neck was performed using the standard protocol during bolus administration of intravenous contrast. Multiplanar CT image reconstructions and MIPs were obtained to evaluate the vascular anatomy. Carotid stenosis measurements (when applicable) are obtained utilizing NASCET criteria, using the distal internal carotid diameter as the denominator. RADIATION DOSE REDUCTION: This exam was performed according to the departmental dose-optimization program which includes automated exposure control, adjustment of the mA and/or kV according to patient size and/or use of iterative reconstruction technique. CONTRAST:  50m OMNIPAQUE IOHEXOL 350 MG/ML SOLN COMPARISON:  CT from earlier the same day. FINDINGS: CTA NECK FINDINGS Aortic arch: Visualized aortic arch normal in caliber with standard branch pattern. Mild atheromatous change about the arch itself. No significant stenosis about the origin the great vessels. Right carotid system: Right common and internal carotid arteries are patent without dissection. Mild atheromatous change about the right carotid bulb without hemodynamically significant greater than 50% stenosis. Left carotid system: Left common and internal carotid arteries are patent without dissection.  Mild atheromatous change about the left carotid bulb without hemodynamically significant greater than 50% stenosis. Vertebral arteries: Left vertebral artery arises directly from the aortic arch. Right vertebral artery slightly dominant. Vertebral arteries patent without stenosis or dissection. Skeleton: No discrete or worrisome osseous lesions. Poor dentition noted. Torus mandibularis noted as well. Other neck: No other acute soft tissue abnormality within the neck. Right-sided Upper chest: Right-sided pacemaker/AICD noted. Upper lobe predominant emphysema. No other acute finding. Review of the MIP images confirms the above findings CTA HEAD FINDINGS Anterior circulation: Atheromatous change within the carotid siphons with up to moderate stenoses bilaterally. A1 segments patent bilaterally. Right A1 hypoplastic. Normal anterior communicating complex. Anterior cerebral arteries patent without significant stenosis. No M1 stenosis or occlusion. No proximal MCA branch occlusion or high-grade stenosis. Distal MCA branches well perfused and symmetric, although demonstrates small vessel atheromatous irregularity. Posterior circulation: Both V4 segments patent without stenosis. Both PICA patent. Basilar patent without stenosis. Superior cerebral arteries patent bilaterally. Both PCAs primarily supplied via the basilar. Atheromatous irregularity within the PCAs without proximal high-grade stenosis. Venous sinuses: Patent allowing for timing the contrast bolus. Anatomic variants: None significant.  No aneurysm. Review of the MIP images confirms the above findings IMPRESSION: 1. Negative CTA for large vessel  occlusion or other emergent finding. 2. Atheromatous change about the carotid siphons with up to moderate stenoses bilaterally. 3. Mild atheromatous irregularity elsewhere about the major arterial vasculature of the head and neck. No other hemodynamically significant or correctable stenosis. Aortic Atherosclerosis  (ICD10-I70.0) and Emphysema (ICD10-J43.9). Electronically Signed   By: Jeannine Boga M.D.   On: 01/24/2022 22:33   CT Head Wo Contrast  Result Date: 01/24/2022 CLINICAL DATA:  Left-sided head pain/eye pain. EXAM: CT HEAD WITHOUT CONTRAST TECHNIQUE: Contiguous axial images were obtained from the base of the skull through the vertex without intravenous contrast. RADIATION DOSE REDUCTION: This exam was performed according to the departmental dose-optimization program which includes automated exposure control, adjustment of the mA and/or kV according to patient size and/or use of iterative reconstruction technique. COMPARISON:  11/03/2016 FINDINGS: Brain: No evidence of acute infarction, hemorrhage, hydrocephalus, extra-axial collection or mass lesion/mass effect. Patchy low-density in the cerebral white matter which is chronic and presumed chronic small vessel ischemia. Vascular: No hyperdense vessel or unexpected calcification. Skull: Normal. Negative for fracture or focal lesion. Sinuses/Orbits: No acute finding. IMPRESSION: No acute finding or explanation for symptoms. Electronically Signed   By: Jorje Guild M.D.   On: 01/24/2022 17:12   DG Chest 2 View  Result Date: 01/24/2022 CLINICAL DATA:  Shortness of breath EXAM: CHEST - 2 VIEW COMPARISON:  08/01/2021 FINDINGS: Cardiac size is within normal limits. Possible coronary artery calcifications are seen. There are no signs of pulmonary edema or focal pulmonary consolidation. There is no pleural effusion or pneumothorax. Pacemaker battery is seen in right infraclavicular region with tips of leads in right atrium and right ventricle. IMPRESSION: No active cardiopulmonary disease. Electronically Signed   By: Elmer Picker M.D.   On: 01/24/2022 15:49    Scheduled Meds:  amLODipine  5 mg Oral BID   atorvastatin  80 mg Oral Daily   clopidogrel  75 mg Oral Daily   finasteride  5 mg Oral Daily   lidocaine  1 patch Transdermal Q24H    lisinopril  20 mg Oral Daily   magnesium oxide  400 mg Oral Daily   metoprolol succinate  25 mg Oral Daily   pantoprazole  40 mg Oral BID   potassium chloride SA  20 mEq Oral Daily   pregabalin  75 mg Oral BID   sodium chloride flush  3 mL Intravenous Q12H   valACYclovir  1,000 mg Oral BID   warfarin  7.5 mg Oral q1600   Warfarin - Pharmacist Dosing Inpatient   Does not apply q1600   zolpidem  10 mg Oral QHS   Continuous Infusions: PRN Meds: acetaminophen, albuterol, morphine, nicotine polacrilex  Time spent: 55 minutes  Author: Val Riles. MD [facility] 01/25/2022 3:13 PM  To reach On-call, see care teams to locate the attending and reach out to them via [email or web address]. If 7PM-7AM, please contact night-coverage If you still have difficulty reaching the attending provider, please page the [facility] (Director on Call) for [facility] on amion for assistance.

## 2022-01-25 NOTE — Progress Notes (Signed)
ANTICOAGULATION CONSULT NOTE - Initial Consult  Pharmacy Consult for Warfarin  Indication: atrial fibrillation  Allergies  Allergen Reactions   Celecoxib Other (See Comments)    Other reaction(s): GI Bleed, Other - See Comments GI BLEEDING Other Reaction: GI BLEEDING GI bleeding    Ciprofloxacin Anaphylaxis and Other (See Comments)    Other reaction(s): Other - See Comments Pt went into At Fib Other Reaction: PUT PT IN A FIB    Dicyclomine     Other reaction(s): chest pain   Hydromorphone Nausea And Vomiting    Other reaction(s): Vomiting  Nausea and vomiting   Oxycodone-Acetaminophen Nausea And Vomiting    Other reaction(s): Vomiting   Ranolazine Anaphylaxis    Patient describes abdominal swelling/bloating that radiates to neck/throat Patient describes abdominal swelling/bloating that radiates to neck/throat    Rivaroxaban Rash    Other reaction(s): Other (See Comments), Other - See Comments SEVERE RECTAL BLEED Sever rectal bleeding Sever rectal bleeding SEVERE RECTAL BLEED    Ezetimibe Diarrhea    Other reaction(s): cough, Other - See Comments Cough Cough    Pregabalin Other (See Comments)    "weight gain"  Weight gain   Apixaban    Dabigatran Etexilate Mesilate [Dabigatran]    Dabigatran Etexilate Mesylate Other (See Comments)   Dilaudid [Hydromorphone Hcl]    Fentanyl     Nauseated and makes pt jittery   Nauseated and makes pt jittery   Nitroglycerin    Other     Other reaction(s): Hives, Itching Pt states he breaks out in red bumps with adhesive materials (ex: patches)   Prednisone    Tramadol     Patient Measurements: Height: 5\' 4"  (162.6 cm) Weight: 88 kg (194 lb) IBW/kg (Calculated) : 59.2 Heparin Dosing Weight:   Vital Signs: Temp: 97.5 F (36.4 C) (12/23 0505) Temp Source: Oral (12/23 0505) BP: 114/68 (12/23 1053) Pulse Rate: 61 (12/23 1046)  Labs: Recent Labs    01/24/22 1459 01/24/22 1827 01/25/22 0502  HGB 12.6*  --  11.9*   HCT 41.0  --  38.5*  PLT 173  --  158  LABPROT 29.8*  --  26.8*  INR 2.9*  --  2.5*  CREATININE 1.11  --   --   CKTOTAL  --  317  --   TROPONINIHS 7 7  --      Estimated Creatinine Clearance: 67.2 mL/min (by C-G formula based on SCr of 1.11 mg/dL).   Medical History: Past Medical History:  Diagnosis Date   Atrial fibrillation (HCC)    CHF (congestive heart failure) (HCC)    Diabetes mellitus without complication (HCC)    Factor 5 Leiden mutation, heterozygous (HCC)    Hypertension    Pacemaker    Sarcoidosis     Medications:  (Not in a hospital admission)  Assessment: Pharmacy consulted to dose warfarin for Afib in this 66 year old male admitted for possible stroke.  Pt was on warfarin 7.5 mg PO daily PTA, last dose on 12/22.    12/22: INR = 2.9 12/23: INR = 2.5   - Of Note:  Pt states he has "home INR machine" and checks and adjusts his home warfarin dose based on readings from home INR machine.   Pt needs to be counseled regarding appropriateness of this practice.   Goal of Therapy:  INR 2-3   Plan:  INR on 12/22 is therapeutic at 2.9 Will continue pt on home warfarin dose of 7.5 mg PO daily.   - Will  need to counsel pt regarding dangers of self-titration of warfarin dose based on readings from "home INR machine" .   Ayahna Solazzo A Jade Burright 01/25/2022,12:10 PM

## 2022-01-25 NOTE — ED Notes (Signed)
Report given to Lauren, RN.

## 2022-01-25 NOTE — ED Notes (Signed)
Patient does not want to keep BP cuff and pulse ox on for monitoring

## 2022-01-25 NOTE — ED Provider Notes (Incomplete)
[facility] Provider Note    Event Date/Time   First MD Initiated Contact with Patient 01/24/22 1642     (approximate)   History   Blurred Vision and Shortness of Breath   HPI  [name] is a 66 y.o. male with a history of CAD, atrial fibrillation on Coumadin, diabetes, CHF, hypertension, and hyperlipidemia who presents with multiple complaints.  He reports left temporal headache, redness to his left eye, and blurred vision to the left eye.  This has been going on for several days.  In addition he feels unsteady or off balance on the left side when walking.  This is also been present for several days.  The patient also reports fatigue, malaise, body aches, sore throat and productive cough for the last 2 to 3 days.  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  I reviewed the past medical records.  The patient was most recently admitted in June.  Per the hospitalist discharge summary from 6/30 he presented with chest pain and was ruled out for ACS.   Physical Exam   Triage Vital Signs: ED Triage Vitals  Enc Vitals Group     BP 01/24/22 1450 139/80     Pulse Rate 01/24/22 1450 73     Resp 01/24/22 1450 18     Temp 01/24/22 1450 98.2 F (36.8 C)     Temp Source 01/24/22 1450 Oral     SpO2 01/24/22 1450 96 %     Weight 01/24/22 1454 194 lb (88 kg)     Height 01/24/22 1454 _0  (1.626 m)     Head Circumference --      Peak Flow --      Pain Score 01/24/22 1453 7     Pain Loc --      Pain Edu? --      Excl. in [place]? --     Most recent vital signs: Vitals:   01/24/22 1450 01/24/22 1711  BP: 139/80 (!) 123/92  Pulse: 73 67  Resp: 18 15  Temp: 98.2 F (36.8 C)   SpO2: 96% 99%     General: Alert and oriented, relatively well-appearing. CV:  Good peripheral perfusion.  Normal heart sounds. Resp:  Normal effort.  Lungs CTAB. Abd:  No distention.  Other:  EOMI.  PERRLA.  No  facial droop.  Motor and sensory intact in all extremities.  Normal coordination with no ataxia on finger-to-nose.  Normal gait.  Mild left temporal area tenderness.  No significant conjunctival injection.   ED Results / Procedures / Treatments   Labs (all labs ordered are listed, but only abnormal results are displayed) Labs Reviewed  CBC WITH DIFFERENTIAL/PLATELET - Abnormal; Notable for the following components:      Result Value   Hemoglobin 12.6 (*)    All other components within normal limits  BASIC METABOLIC PANEL - Abnormal; Notable for the following components:   Glucose, Bld 147 (*)    All other components within normal limits  PROTIME-INR - Abnormal; Notable for the following components:   Prothrombin Time 29.8 (*)    INR 2.9 (*)    All other components within normal limits  BRAIN NATRIURETIC PEPTIDE  SEDIMENTATION RATE  TROPONIN I (HIGH SENSITIVITY)  TROPONIN I (HIGH SENSITIVITY)     EKG  ED ECG REPORT I, Arta Silence, the attending physician, personally viewed and interpreted this  ECG.  Date: 01/24/2022 EKG Time: 1524 Rate: 73 Rhythm: normal sinus rhythm QRS Axis: normal Intervals: normal ST/T Wave abnormalities: nonspecific ST abnormalities Narrative Interpretation: no evidence of acute ischemia; no significant change when compared to EKG of 08/01/21   RADIOLOGY  Chest x-ray: I independently viewed and interpreted the images; there is no focal consolidation or edema  CT head: No ICH or other acute abnormality  PROCEDURES:  Critical Care performed: No  Procedures   MEDICATIONS ORDERED IN ED: Medications - No data to display   IMPRESSION / MDM / [place] / ED COURSE  I reviewed the triage vital signs and the nursing notes.  66 year old male with PMH as noted above presents with multiple complaints mostly over the last several days.  His main complaints are left temporal headache and blurred vision in the left eye; cough, body  aches, and URI symptoms; and unsteadiness or difficulty with balance when walking.  On exam, the patient has mild left temporal area tenderness but no other significant exam findings.  Neurologic exam is normal including his gait.  External eye exam is normal.  Visual acuity is not significantly different between eyes.  Differential diagnosis includes, but is not limited to:  Temporal headache/subjective blurred vision: The patient has no actual significant visual deficit or concerning ocular exam findings.  He has mild temporal tenderness.  Differential includes temporal arteritis, migraine headache, mild conjunctivitis, or other benign etiology.  We will obtain labs including ESR and CBC and reassess. Cough/URI symptoms/body aches: This is most consistent with viral syndrome.  Will obtain a chest x-ray and a respiratory panel Gait disturbance: The patient has no neurologic deficits at this time.  Differential includes TIA although I have a low suspicion for acute stroke.  Will obtain CT head.  Patient's presentation is most consistent with acute presentation with potential threat to life or bodily function.  The patient is on the cardiac monitor to evaluate for evidence of arrhythmia and/or significant heart rate changes.  ----------------------------------------- 8:27 PM on 01/24/2022 -----------------------------------------  Consulted hospitalist for admission   FINAL CLINICAL IMPRESSION(S) / ED DIAGNOSES   Final diagnoses:  None     Rx / DC Orders   ED Discharge Orders     None        Note:  This document was prepared using Dragon voice recognition software and may include unintentional dictation errors.

## 2022-01-25 NOTE — Progress Notes (Signed)
       CROSS COVER NOTE  NAME: [name] MRN: [id number] DOB : Jun 17, 1955   HPI/Events of Note   Active vomiting reported from RN  Assessment and  Interventions   Assessment: Qtc 431 on recent EKg Plan: Prn zofran ordered       Donnie Mesa NP [facility]

## 2022-01-26 ENCOUNTER — Observation Stay: Payer: Medicare HMO

## 2022-01-26 DIAGNOSIS — R519 Headache, unspecified: Secondary | ICD-10-CM

## 2022-01-26 DIAGNOSIS — R2681 Unsteadiness on feet: Secondary | ICD-10-CM

## 2022-01-26 DIAGNOSIS — H11432 Conjunctival hyperemia, left eye: Secondary | ICD-10-CM | POA: Diagnosis not present

## 2022-01-26 DIAGNOSIS — H538 Other visual disturbances: Secondary | ICD-10-CM | POA: Diagnosis not present

## 2022-01-26 DIAGNOSIS — B0230 Zoster ocular disease, unspecified: Secondary | ICD-10-CM

## 2022-01-26 DIAGNOSIS — H5310 Unspecified subjective visual disturbances: Secondary | ICD-10-CM | POA: Diagnosis not present

## 2022-01-26 DIAGNOSIS — R0602 Shortness of breath: Secondary | ICD-10-CM

## 2022-01-26 DIAGNOSIS — R270 Ataxia, unspecified: Secondary | ICD-10-CM | POA: Diagnosis not present

## 2022-01-26 LAB — CBC
HCT: 40.2 % (ref 39.0–52.0)
Hemoglobin: 12.7 g/dL — ABNORMAL LOW (ref 13.0–17.0)
MCH: 26.6 pg (ref 26.0–34.0)
MCHC: 31.6 g/dL (ref 30.0–36.0)
MCV: 84.3 fL (ref 80.0–100.0)
Platelets: 165 10*3/uL (ref 150–400)
RBC: 4.77 MIL/uL (ref 4.22–5.81)
RDW: 14.6 % (ref 11.5–15.5)
WBC: 6.3 10*3/uL (ref 4.0–10.5)
nRBC: 0 % (ref 0.0–0.2)

## 2022-01-26 LAB — BASIC METABOLIC PANEL
Anion gap: 6 (ref 5–15)
BUN: 11 mg/dL (ref 8–23)
CO2: 24 mmol/L (ref 22–32)
Calcium: 8.4 mg/dL — ABNORMAL LOW (ref 8.9–10.3)
Chloride: 108 mmol/L (ref 98–111)
Creatinine, Ser: 0.85 mg/dL (ref 0.61–1.24)
GFR, Estimated: >60 mL/min (ref >60)
Glucose, Bld: 125 mg/dL — ABNORMAL HIGH (ref 70–99)
Potassium: 3.7 mmol/L (ref 3.5–5.1)
Sodium: 138 mmol/L (ref 135–145)

## 2022-01-26 LAB — TROPONIN I (HIGH SENSITIVITY)
Troponin I (High Sensitivity): 6 ng/L (ref <18)
Troponin I (High Sensitivity): 7 ng/L (ref <18)

## 2022-01-26 LAB — C-REACTIVE PROTEIN: CRP: 1 mg/dL — ABNORMAL HIGH (ref <1.0)

## 2022-01-26 LAB — PROTIME-INR
INR: 1.7 — ABNORMAL HIGH (ref 0.8–1.2)
Prothrombin Time: 19.7 seconds — ABNORMAL HIGH (ref 11.4–15.2)

## 2022-01-26 LAB — MAGNESIUM: Magnesium: 2.1 mg/dL (ref 1.7–2.4)

## 2022-01-26 LAB — BRAIN NATRIURETIC PEPTIDE: B Natriuretic Peptide: 26.1 pg/mL (ref 0.0–100.0)

## 2022-01-26 LAB — SEDIMENTATION RATE: Sed Rate: 5 mm/hr (ref 0–20)

## 2022-01-26 LAB — PHOSPHORUS: Phosphorus: 3.2 mg/dL (ref 2.5–4.6)

## 2022-01-26 MED ORDER — WARFARIN SODIUM 10 MG PO TABS
10.0000 mg | ORAL_TABLET | Freq: Once | ORAL | Status: AC
  Administered 2022-01-26: 10 mg via ORAL
  Filled 2022-01-26: qty 1

## 2022-01-26 MED ORDER — HYDROCODONE-ACETAMINOPHEN 7.5-325 MG/15ML PO SOLN
10.0000 mL | Freq: Four times a day (QID) | ORAL | Status: DC | PRN
  Administered 2022-01-26 – 2022-01-30 (×10): 10 mL via ORAL
  Filled 2022-01-26 (×10): qty 15

## 2022-01-26 MED ORDER — GABAPENTIN 800 MG PO TABS: 800.0000 mg | ORAL_TABLET | Freq: Three times a day (TID) | ORAL | Status: DC

## 2022-01-26 MED ORDER — ARTIFICIAL TEARS OPHTHALMIC OINT
TOPICAL_OINTMENT | OPHTHALMIC | Status: DC
  Administered 2022-01-28 (×8): 1 via OPHTHALMIC
  Filled 2022-01-26: qty 3.5

## 2022-01-26 MED ORDER — VITAMIN C 500 MG PO TABS
250.0000 mg | ORAL_TABLET | Freq: Every day | ORAL | Status: DC
  Administered 2022-01-26 – 2022-01-30 (×5): 250 mg via ORAL
  Filled 2022-01-26 (×5): qty 1

## 2022-01-26 MED ORDER — BACITRACIN ZINC 500 UNIT/GM EX OINT
TOPICAL_OINTMENT | Freq: Two times a day (BID) | CUTANEOUS | Status: DC
  Administered 2022-01-29: 1 via TOPICAL
  Filled 2022-01-26 (×7): qty 0.9

## 2022-01-26 MED ORDER — VITAMIN B-12 1000 MCG PO TABS
1000.0000 ug | ORAL_TABLET | Freq: Every day | ORAL | Status: DC
  Administered 2022-01-26 – 2022-01-30 (×5): 1000 ug via ORAL
  Filled 2022-01-26 (×5): qty 1

## 2022-01-26 MED ORDER — SODIUM CHLORIDE 0.9 % IV SOLN: INTRAVENOUS | Status: DC

## 2022-01-26 MED ORDER — GABAPENTIN 400 MG PO CAPS
800.0000 mg | ORAL_CAPSULE | Freq: Three times a day (TID) | ORAL | Status: DC
  Administered 2022-01-26 – 2022-01-30 (×14): 800 mg via ORAL
  Filled 2022-01-26 (×14): qty 2

## 2022-01-26 MED ORDER — IPRATROPIUM-ALBUTEROL 0.5-2.5 (3) MG/3ML IN SOLN: 3.0000 mL | RESPIRATORY_TRACT | Status: AC

## 2022-01-26 MED ORDER — ACETAMINOPHEN 325 MG PO TABS
650.0000 mg | ORAL_TABLET | Freq: Four times a day (QID) | ORAL | Status: DC | PRN
  Administered 2022-01-27 – 2022-01-28 (×2): 650 mg via ORAL
  Filled 2022-01-26 (×3): qty 2

## 2022-01-26 MED ORDER — DEXAMETHASONE SODIUM PHOSPHATE 10 MG/ML IJ SOLN
8.0000 mg | INTRAMUSCULAR | Status: DC
  Administered 2022-01-26 – 2022-01-30 (×5): 8 mg via INTRAVENOUS
  Filled 2022-01-26 (×5): qty 1

## 2022-01-26 MED ORDER — DEXTROSE 5 % IV SOLN
760.0000 mg | Freq: Three times a day (TID) | INTRAVENOUS | Status: DC
  Administered 2022-01-26 – 2022-01-30 (×13): 760 mg via INTRAVENOUS
  Filled 2022-01-26 (×14): qty 15.2

## 2022-01-26 MED ORDER — HYDROCODONE-ACETAMINOPHEN 7.5-325 MG/15ML PO SOLN
10.0000 mL | Freq: Once | ORAL | Status: AC
  Administered 2022-01-26: 10 mL via ORAL
  Filled 2022-01-26: qty 15

## 2022-01-26 MED ORDER — VITAMIN D3 25 MCG (1000 UNIT) PO TABS
1000.0000 [IU] | ORAL_TABLET | Freq: Every day | ORAL | Status: DC
  Administered 2022-01-26 – 2022-01-30 (×5): 1000 [IU] via ORAL
  Filled 2022-01-26 (×10): qty 1

## 2022-01-26 NOTE — Consult Note (Addendum)
NEUROLOGY CONSULTATION NOTE   Date of service: January 26, 2022 Patient Name: [name] MRN:  [id number] DOB:  [birth year] Reason for consult: L head tenderness, L eye pain, and blurry vision on the L Requesting physician: Dr. Vanna Scotland _ _ _   _ __   _ __ _ _  __ __   _ __   __ _  History of Present Illness   This is a 66 year old gentleman with past medical history significant for DM2, CAD s/p multiple stents on plavix, hx TIA or CVA, a fib and factor V Leiden mutation, hypertension, pacemaker, sarcoidosis who presents with tenderness in his left head, left eye pain, and blurry vision on the left.  Symptoms began approximately 4 days ago when he woke up with burning of his left eye.  He reports tenderness around the left orbit and more superiorly to his proximately 60% of the way back across the top of his head.  It is tender to the touch.  There is approximately a 1 cm patch of erythema above his temple.  No vesicles.  He has a history of chickenpox infection and has received his shingles vaccination.  He has some pain on left eye movement particularly when looking downward.  There is minimal conjunctival injection. He feels that his L eye is swollen but this is not evident to me on physical exam. Patient reports a fever at home approximately 2 days ago.  Patient reported unsteady gait and dizziness to the hospitalist but did not report this to me.He is on gabapentin 878m tid. This was discontinued yesterday in favor of starting lyrica but he said the gabapentin worked better so he was switched back to it. He has an AICD initially [facility] in 2010 [facility]. I cannot definitively say from the records whether his device and leads are MRI-compatible.  CT head showed no acute finding or explanation for the symptoms.  CTA head and neck showed no hemodynamically significant stenosis.  CT abdomen pelvis showed no acute findings. CRP 1. ESR 5.  ROS    Per HPI: all other systems reviewed and are negative  Past History   I have reviewed the following:  Past Medical History:  Diagnosis Date   Atrial fibrillation (HCC)    CHF (congestive heart failure) (HCC)    Diabetes mellitus without complication ([facility]    Factor 5 Leiden mutation, heterozygous ([place]    Hypertension    Pacemaker    Sarcoidosis    Past Surgical History:  Procedure Laterality Date   CARDIAC SURGERY     HERNIA REPAIR     TRANSURETHRAL RESECTION OF PROSTATE     Family History  Problem Relation Age of Onset   Diabetes Mother    CAD Mother    Hypertension Mother    CAD Father    Hyperlipidemia Father    Hypertension Father    Social History   Socioeconomic History   Marital status: Single    Spouse name: Not on file   Number of children: Not on file   Years of education: Not on file   Highest education level: Not on file  Occupational History   Not on file  Tobacco Use   Smoking status: Former   Smokeless tobacco: Never  Vaping Use   Vaping Use: Never used  Substance and Sexual Activity   Alcohol use: No   Drug use: No   Sexual activity: Not on file  Other Topics Concern   Not on file  Social History Narrative   Not on file   Social Determinants of Health   Financial Resource Strain: Not on file  Food Insecurity: No Food Insecurity (01/25/2022)   Hunger Vital Sign    Worried About Running Out of Food in the Last Year: Never true    Ran Out of Food in the Last Year: Never true  Transportation Needs: No Transportation Needs (01/25/2022)   PRAPARE - Hydrologist (Medical): No    Lack of Transportation (Non-Medical): No  Physical Activity: Not on file  Stress: Not on file  Social Connections: Not on file   Allergies  Allergen Reactions   Celecoxib Other (See Comments)    Other reaction(s): GI Bleed, Other - See Comments GI BLEEDING Other Reaction: GI BLEEDING GI bleeding    Ciprofloxacin Anaphylaxis  and Other (See Comments)    Other reaction(s): Other - See Comments Pt went into At Fib Other Reaction: PUT PT IN A FIB    Dicyclomine     Other reaction(s): chest pain   Hydromorphone Nausea And Vomiting    Other reaction(s): Vomiting  Nausea and vomiting   Oxycodone-Acetaminophen Nausea And Vomiting    Other reaction(s): Vomiting   Ranolazine Anaphylaxis    Patient describes abdominal swelling/bloating that radiates to neck/throat Patient describes abdominal swelling/bloating that radiates to neck/throat    Rivaroxaban Rash    Other reaction(s): Other (See Comments), Other - See Comments SEVERE RECTAL BLEED Sever rectal bleeding Sever rectal bleeding SEVERE RECTAL BLEED    Ezetimibe Diarrhea    Other reaction(s): cough, Other - See Comments Cough Cough    Pregabalin Other (See Comments)    "weight gain"  Weight gain   Apixaban    Dabigatran Etexilate Mesilate [Dabigatran]    Dabigatran Etexilate Mesylate Other (See Comments)   Dilaudid [Hydromorphone Hcl]    Fentanyl     Nauseated and makes pt jittery   Nauseated and makes pt jittery   Nitroglycerin    Other     Other reaction(s): Hives, Itching Pt states he breaks out in red bumps with adhesive materials (ex: patches)   Prednisone    Tramadol     Medications   Medications Prior to Admission  Medication Sig Dispense Refill Last Dose   albuterol (VENTOLIN HFA) 108 (90 Base) MCG/ACT inhaler Inhale 2 puffs into the lungs every 4 (four) hours as needed.   prn at prn   amLODipine (NORVASC) 2.5 MG tablet Take 2.5 mg by mouth 2 (two) times daily.   01/24/2022 at 0930   ascorbic acid (VITAMIN C) 250 MG tablet Take 250 mg by mouth daily.   01/24/2022 at 0930   atorvastatin (LIPITOR) 80 MG tablet Take 80 mg by mouth daily.   01/24/2022 at 0930   cholecalciferol (VITAMIN D3) 10 MCG (400 UNIT) TABS tablet Take 400 Units by mouth daily.   01/24/2022 at 0930   clopidogrel (PLAVIX) 75 MG tablet Take 75 mg by mouth daily.    01/24/2022 at 0930   cyanocobalamin (VITAMIN B12) 1000 MCG tablet Take 1,000 mcg by mouth daily.   01/24/2022 at 0930   finasteride (PROSCAR) 5 MG tablet Take 5 mg by mouth daily.   01/24/2022 at 0930   gabapentin (NEURONTIN) 800 MG tablet Take 800 mg by mouth 3 (three) times daily. Take 800 mg by mouth in the morning, at noon, and at bedtime.   01/24/2022 at 0930   HYDROcodone-acetaminophen (  HYCET) 7.5-325 mg/15 ml solution Take 7.5 mLs by mouth as directed.   prn at prn   ipratropium-albuterol (DUONEB) 0.5-2.5 (3) MG/3ML SOLN Take 3 mLs by nebulization every 6 (six) hours.   prn at prn   lisinopril (ZESTRIL) 2.5 MG tablet Take 2.5 mg by mouth daily.   01/24/2022 at 0930   magnesium oxide (MAG-OX) 400 (240 Mg) MG tablet Take 400 mg by mouth daily.   01/24/2022 at 0930   metoprolol succinate (TOPROL-XL) 100 MG 24 hr tablet Take 100 mg by mouth daily.   01/24/2022 at 0930   nicotine polacrilex (NICORETTE) 4 MG gum Take 4 mg by mouth as needed for smoking cessation.   prn at prn   pantoprazole (PROTONIX) 40 MG tablet Take 40 mg by mouth 2 (two) times daily.   01/24/2022 at 0930   potassium chloride SA (KLOR-CON M20) 20 MEQ tablet Take 20 mEq by mouth daily.   01/24/2022 at 0930   tadalafil (CIALIS) 20 MG tablet Take 20 mg by mouth daily as needed for erectile dysfunction.   prn at prn   tamsulosin (FLOMAX) 0.4 MG CAPS capsule Take 0.4 mg by mouth daily.   01/24/2022 at 0930   warfarin (COUMADIN) 7.5 MG tablet Take 7.5 mg by mouth daily.   01/24/2022 at 0930   zolpidem (AMBIEN) 10 MG tablet Take 10 mg by mouth at bedtime.   01/23/2022 at 2200   clotrimazole (LOTRIMIN) 1 % cream Apply 1 Application topically 2 (two) times daily as needed. (Patient not taking: Reported on 01/24/2022)   Not Taking   gabapentin (NEURONTIN) 600 MG tablet Take 600 mg by mouth in the morning, at noon, and at bedtime. (Patient not taking: Reported on 01/24/2022)   Not Taking   oxyCODONE-acetaminophen (PERCOCET) 10-325 MG  tablet Take 1 tablet by mouth every 6 (six) hours as needed for pain. (Patient not taking: Reported on 01/24/2022)   Not Taking      Current Facility-Administered Medications:    acetaminophen (TYLENOL) tablet 650 mg, 650 mg, Oral, Q6H PRN, Val Riles, MD   albuterol (PROVENTIL) (2.5 MG/3ML) 0.083% nebulizer solution 2.5 mg, 2.5 mg, Nebulization, Q4H PRN, Alison Murray, RPH, 2.5 mg at 01/24/22 2254   amLODipine (NORVASC) tablet 5 mg, 5 mg, Oral, BID, Gertie Fey, MD, 5 mg at 01/26/22 1607   artificial tears (LACRILUBE) ophthalmic ointment, , Both Eyes, Q1H, Vanna Scotland, MD, Given at 01/26/22 1456   ascorbic acid (VITAMIN C) tablet 250 mg, 250 mg, Oral, Daily, Alanda Amass, Aseem, MD, 250 mg at 01/26/22 1453   atorvastatin (LIPITOR) tablet 80 mg, 80 mg, Oral, Daily, Gertie Fey, MD, 80 mg at 01/26/22 3710   bacitracin ointment, , Topical, BID, Vanna Scotland, MD, Given at 01/26/22 1240   cholecalciferol (VITAMIN D3) tablet 1,000 Units, 1,000 Units, Oral, Daily, Vanna Scotland, MD, 1,000 Units at 01/26/22 1453   clopidogrel (PLAVIX) tablet 75 mg, 75 mg, Oral, Daily, Gertie Fey, MD, 75 mg at 01/26/22 6269   cyanocobalamin (VITAMIN B12) tablet 1,000 mcg, 1,000 mcg, Oral, Daily, Alanda Amass, Aseem, MD, 1,000 mcg at 01/26/22 1453   finasteride (PROSCAR) tablet 5 mg, 5 mg, Oral, Daily, Gertie Fey, MD, 5 mg at 01/26/22 4854   gabapentin (NEURONTIN) capsule 800 mg, 800 mg, Oral, TID, Alanda Amass, Aseem, MD   lidocaine (LIDODERM) 5 % 1 patch, 1 patch, Transdermal, Q24H, Val Riles, MD, 1 patch at 01/25/22 1300   lisinopril (ZESTRIL) tablet 20 mg, 20 mg, Oral, Daily, Gertie Fey, MD, 20 mg at  01/26/22 0823   magnesium oxide (MAG-OX) tablet 400 mg, 400 mg, Oral, Daily, Gertie Fey, MD, 400 mg at 01/26/22 8453   metoprolol succinate (TOPROL-XL) 24 hr tablet 25 mg, 25 mg, Oral, Daily, Gertie Fey, MD, 25 mg at 01/26/22 6468   nicotine polacrilex (NICORETTE) gum 4 mg, 4 mg, Oral, PRN, Gertie Fey, MD   ondansetron [facility])  injection 4 mg, 4 mg, Intravenous, Q6H PRN, Sharion Settler, NP, 4 mg at 01/25/22 1929   pantoprazole (PROTONIX) EC tablet 40 mg, 40 mg, Oral, BID, Gertie Fey, MD, 40 mg at 01/26/22 0321   potassium chloride SA (KLOR-CON M) CR tablet 20 mEq, 20 mEq, Oral, Daily, Gertie Fey, MD, 20 mEq at 01/26/22 2248   sodium chloride flush (NS) 0.9 % injection 3 mL, 3 mL, Intravenous, Q12H, Gertie Fey, MD, 3 mL at 01/26/22 2500   valACYclovir (VALTREX) tablet 1,000 mg, 1,000 mg, Oral, TID, Val Riles, MD, 1,000 mg at 01/26/22 3704   warfarin (COUMADIN) tablet 10 mg, 10 mg, Oral, ONCE-1600, Nazari, Walid A, RPH   Warfarin - Pharmacist Dosing Inpatient, , Does not apply, q1600, Gertie Fey, MD, Given at 01/25/22 1746   zolpidem (AMBIEN) tablet 10 mg, 10 mg, Oral, QHS, Gertie Fey, MD, 10 mg at 01/25/22 2113  Vitals   Vitals:   01/25/22 1734 01/25/22 2044 01/25/22 2312 01/26/22 0959  BP: 122/75 124/80 110/81 115/73  Pulse: 73 80 79 68  Resp: (!) _0 Temp: 98.4 F (36.9 C) 98.7 F (37.1 C) 98.2 F (36.8 C) (!) 97.5 F (36.4 C)  TempSrc: Oral Oral    SpO2: 100% 92% 99% 100%  Weight:   93.6 kg   Height:   _1  (1.676 m)      Body mass index is 33.31 kg/m.  Physical Exam   Physical Exam Gen: A&O x4, NAD HEENT: Atraumatic, normocephalic;mucous membranes moist; oropharynx clear, tongue without atrophy or fasciculations. Slight erythema in approx 2cm patch on the left superior head about 2-3 inches above and 1 inch anterior to the ear Neck: Supple, trachea midline. Resp: CTAB, no w/r/r CV: RRR, no m/g/r; nml S1 and S2. 2+ symmetric peripheral pulses. Abd: soft/NT/ND; nabs x 4 quad Extrem: Nml bulk; no cyanosis, clubbing, or edema.  Neuro: *MS: A&O x4. Follows multi-step commands.  *Speech: fluid, nondysarthric, able to name and repeat *CN:    I: Deferred   II,III: PERRLA, VFF by confrontation but vision overall is blurred in the L eye, optic discs unable to be visualized 2/2  pupillary constriction   III,IV,VI: EOMI w/o nystagmus, no ptosis   V: Sensation intact from V1 to V3 to LT   VII: Eyelid closure was full.  Smile symmetric.   VIII: Hearing intact to voice   IX,X: Voice normal, palate elevates symmetrically    XI: SCM/trap 5/5 bilat   XII: Tongue protrudes midline, no atrophy or fasciculations   *Motor:   Normal bulk.  No tremor, rigidity or bradykinesia. Somewhat effort-dependent but with coaching is 5/5 throughout *Sensory: Intact to light touch, pinprick, temperature vibration throughout. Symmetric. Propioception intact bilat.  No double-simultaneous extinction.  *Coordination:  Finger-to-nose, heel-to-shin, rapid alternating motions were intact. *Reflexes:  2+ more brisk on L throughout without clonus; toes down-going bilat *Gait: deferred   Labs   CBC:  Recent Labs  Lab 01/24/22 1459 01/25/22 0502 01/26/22 0503  WBC 5.7 5.6 6.3  NEUTROABS 3.8  --   --   HGB 12.6* 11.9* 12.7*  HCT 41.0  38.5* 40.2  MCV 84.7 85.0 84.3  PLT 173 158 885    Basic Metabolic Panel:  Lab Results  Component Value Date   NA 138 01/26/2022   K 3.7 01/26/2022   CO2 24 01/26/2022   GLUCOSE 125 (H) 01/26/2022   BUN 11 01/26/2022   CREATININE 0.85 01/26/2022   CALCIUM 8.4 (L) 01/26/2022   GFRNONAA >60 01/26/2022   GFRAA >60 11/03/2016   Lipid Panel:  Lab Results  Component Value Date   LDLCALC 65 08/01/2021   HgbA1c:  Lab Results  Component Value Date   HGBA1C 5.8 (H) 08/01/2021   Urine Drug Screen:     Component Value Date/Time   LABOPIA POSITIVE (A) 08/01/2021 0834   LABOPIA NONE DETECTED 05/27/2007 1525   COCAINSCRNUR NONE DETECTED 08/01/2021 0834   LABBENZ NONE DETECTED 08/01/2021 0834   LABBENZ NONE DETECTED 05/27/2007 1525   AMPHETMU NONE DETECTED 08/01/2021 0834   AMPHETMU NONE DETECTED 05/27/2007 1525   THCU NONE DETECTED 08/01/2021 0834   THCU NONE DETECTED 05/27/2007 1525   LABBARB NONE DETECTED 08/01/2021 0834   LABBARB  05/27/2007  1525    NONE DETECTED        DRUG SCREEN FOR MEDICAL PURPOSES ONLY.  IF CONFIRMATION IS NEEDED FOR ANY PURPOSE, NOTIFY LAB WITHIN 5 DAYS.    Alcohol Level No results found for: "ETH"  CT head  - known acute process, chronic small vessel ischemic disease CT head at 24 hrs - same  CTA H&N - no hemodynamically-significant stenoses  CNS imaging personally reviewed  Impression   This is a 66 year old gentleman with past medical history significant for DM2, CAD s/p multiple stents on plavix, hx TIA or CVA, a fib and factor V Leiden mutation, hypertension, pacemaker, sarcoidosis who presents with tenderness in his left head, left eye pain, and blurry vision on the left. My primary concern is that he could have herpes zoster in the V1 distribution with L eye involvement. All sx are in the V1 distribution including the pain on the top of his head. He may develop associated vesicular lesions over the next few days. I am concerned enough about this and the associated danger of permanent vision loss that I feel switching from po valtrex to IV acyclovir is warranted. Optic neuritis could cause the blurry vision and pain on eye movement but would not explain the constant burning in the V1 distribution (MS can cause trigeminal neuralgia but his sx are not paroxysmal or otherwise consistent with that). MRI brain and orbits with and without contrast are warranted but it is unclear if his device is MRI-compatible. He has an AICD initially placed in 2010 s/p generator replacement at [facility] in 2021 with 4 lead revisions. I cannot definitively say from the records whether his device and leads are compatible. Patient has normal ESR and CRP making GCA less likely. Hospitalist briefly discussed patient with ophtho on admission but full consultation would be beneficial.  Recommendations   - Formal ophthalmology consult - Start acyclovir IV 75m/kg q 8 hrs + dexamethasone - D/c valtrex - If patient is determined to  have a MRI-compatible AICD then [facility] with and without contrast - Continue gabapentin 8050mtid - Will continue to follow ______________________________________________________________________   Thank you for the opportunity to take part in the care of this patient. If you have any further questions, please contact the neurology consultation attending.  Signed,  CoSu MonksMD [facility] [phone]  If 7pm- 7am, please page neurology on call as listed in [place].  **Any copied and pasted documentation in this note was written by me in another application not billed for and pasted by me into this document.Marland Kitchent

## 2022-01-26 NOTE — Consult Note (Signed)
Pharmacy Antibiotic Note  [name] is a 66 y.o. male admitted on 01/24/2022 with concern for  herpes zoster with eye involvement .  Pharmacy has been consulted for acyclovir dosing.   Plan: Start acyclovir 760 mg IV every 8 hours Start normal saline infusion 50 ml/hr  Continue to monitor and dose adjust antibiotics according to renal function and indication  Height: 5\' 6"  (167.6 cm) Weight: 93.6 kg (206 lb 5.6 oz) IBW/kg (Calculated) : 63.8  Temp (24hrs), Avg:98.2 F (36.8 C), Min:97.5 F (36.4 C), Max:98.7 F (37.1 C)  Recent Labs  Lab 01/24/22 1459 01/25/22 0502 01/26/22 0503  WBC 5.7 5.6 6.3  CREATININE 1.11  --  0.85    Estimated Creatinine Clearance: 94 mL/min (by C-G formula based on SCr of 0.85 mg/dL).    Allergies  Allergen Reactions   Celecoxib Other (See Comments)    Other reaction(s): GI Bleed, Other - See Comments GI BLEEDING Other Reaction: GI BLEEDING GI bleeding    Ciprofloxacin Anaphylaxis and Other (See Comments)    Other reaction(s): Other - See Comments Pt went into At Fib Other Reaction: PUT PT IN A FIB    Dicyclomine     Other reaction(s): chest pain   Hydromorphone Nausea And Vomiting    Other reaction(s): Vomiting  Nausea and vomiting   Oxycodone-Acetaminophen Nausea And Vomiting    Other reaction(s): Vomiting   Ranolazine Anaphylaxis    Patient describes abdominal swelling/bloating that radiates to neck/throat Patient describes abdominal swelling/bloating that radiates to neck/throat    Rivaroxaban Rash    Other reaction(s): Other (See Comments), Other - See Comments SEVERE RECTAL BLEED Sever rectal bleeding Sever rectal bleeding SEVERE RECTAL BLEED    Ezetimibe Diarrhea    Other reaction(s): cough, Other - See Comments Cough Cough    Pregabalin Other (See Comments)    "weight gain"  Weight gain   Apixaban    Dabigatran Etexilate Mesilate [Dabigatran]    Dabigatran Etexilate Mesylate Other (See Comments)   Dilaudid  [Hydromorphone Hcl]    Fentanyl     Nauseated and makes pt jittery   Nauseated and makes pt jittery   Nitroglycerin    Other     Other reaction(s): Hives, Itching Pt states he breaks out in red bumps with adhesive materials (ex: patches)   Prednisone    Tramadol     Antimicrobials this admission: 12/23 Valacyclovir >> 12/24  12/24 Acyclovir >>    Microbiology results: 12/24 Resp Panel PCR: sent 12/22 RSV, Flu, Covid PCR: NEgative  Thank you for allowing pharmacy to be a part of this patient's care.  1/23 01/26/2022 4:46 PM

## 2022-01-26 NOTE — Evaluation (Signed)
Physical Therapy Evaluation Patient Details Name: [name] MRN: [id number] DOB: 1955-02-06 Today's Date: 01/26/2022  History of Present Illness  [name] is a 66 y.o. 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 Patient is feeling sensation of being drunk and tipsy, does not drink.  Patient is also experiencing unsteady gait, wobbly and drunken gait for past few days   Clinical Impression  Patient reclining in bed and agreeable to PT eval at start of session. He lives with his father in a single story home with 3 steps to enter with a handrail. Prior to hospitalization he was I with all aspects of care and mobility. He drives and he denies falls in the last 6 months. Upon PT evaluation, patient was I with bed mobility and completed sit <> stand independently without evidence of unsteadiness. Patient did demonstrate some veering to the right with occasional staggering when ambulating approx 300 feet with supervision. He was able to correct his balance with step strategy. He ascended/descended 6 steps with step over step gait pattern and no UE support with supervision. Patient appears to be functionally independent but may benefit from OP PT to address tendency to veer to the right upon discharge from acute care setting. Patient would benefit from skilled physical therapy to address impairments and functional limitations (see PT Problem List below) to work towards stated goals and return to PLOF or maximal functional independence.       Recommendations for follow up therapy are one component of a multi-disciplinary discharge planning process, led by the attending physician.  Recommendations may be updated based on  patient status, additional functional criteria and insurance authorization.  Follow Up Recommendations Outpatient PT      Assistance Recommended at Discharge None  Patient can return home with the following  Other (comment) (patient does not appear to need any asssitance to return home)    Equipment Recommendations None recommended by PT  Recommendations for Other Services       Functional Status Assessment Patient has had a recent decline in their functional status and demonstrates the ability to make significant improvements in function in a reasonable and predictable amount of time.     Precautions / Restrictions Precautions Precautions: Fall Restrictions Weight Bearing Restrictions: No      Mobility  Bed Mobility Overal bed mobility: Independent                  Transfers Overall transfer level: Independent                      Ambulation/Gait Ambulation/Gait assistance: Supervision Gait Distance (Feet): 300 Feet Assistive device: None Gait Pattern/deviations: Staggering right Gait velocity: WNL     General Gait Details: Patient ambulated approximately 300 feet with supervision and no AD. He occasionally drifted or staggered to the right and reported he felt this was because it is hard for him to see out of left eye. He ambulated with horizontal and vertical head nods and backwards with no change in symptoms causing him to drift right.  Stairs Stairs: Yes Stairs assistance: Independent Stair Management: No rails Number of Stairs: 6 General stair comments: patient ascended and decended 6 steps with  no UE support in step over step pattern. He reported breif R calf pain directly after that improved with rest.  Wheelchair Mobility    Modified Rankin (Stroke Patients Only)       Balance Overall balance assessment: Needs assistance Sitting-balance support: No upper extremity supported, Feet unsupported Sitting balance-Leahy Scale:  Normal Sitting balance - Comments: able to don both socks independently     Standing balance-Leahy Scale: Good Standing balance comment: while walking patient occasionally drifted or staggered to the right and reported he felt this was because it is hard for him to see out of left eye. He ambulated with horizontal and vertical head nods and backwards with no change in symptoms causing him to drift right.                             Pertinent Vitals/Pain Pain Assessment Pain Assessment: No/denies pain    Home Living Family/patient expects to be discharged to:: Private residence Living Arrangements: Parent (father) Available Help at Discharge: Family;Available 24 hours/day Type of Home: House Home Access: Stairs to enter Entrance Stairs-Rails: Can reach both;Left;Right Entrance Stairs-Number of Steps: 3   Home Layout: One level Home Equipment: Conservation officer, nature (2 wheels);Cane - single point      Prior Function Prior Level of Function : Driving;Independent/Modified Independent             Mobility Comments: Patient reports he was I with all aspects of mobility. ADLs Comments: Patient reports he was I with all aspects of care     Hand Dominance        Extremity/Trunk Assessment   Upper Extremity Assessment Upper Extremity Assessment: Overall WFL for tasks assessed    Lower Extremity Assessment Lower Extremity Assessment: Overall WFL for tasks assessed    Cervical / Trunk Assessment Cervical / Trunk Assessment: Normal  Communication   Communication: No difficulties  Cognition Arousal/Alertness: Awake/alert Behavior During Therapy: WFL for tasks assessed/performed Overall Cognitive Status: Within Functional Limits for tasks assessed                                          General Comments      Exercises Other Exercises Other Exercises: educated patient on role of PT in acute care setting and on discharge reccomendations.    Assessment/Plan    PT Assessment All further PT needs can be met in the next venue of care  PT Problem List Decreased balance;Decreased mobility       PT Treatment Interventions      PT Goals (Current goals can be found in the Care Plan section)  Acute Rehab PT Goals Patient Stated Goal: to get better PT Goal Formulation: With patient Time For Goal Achievement: 02/09/22 Potential to Achieve Goals: Good    Frequency       Co-evaluation               AM-PAC PT "6 Clicks" Mobility  Outcome Measure Help needed turning from your back to your side while in a flat bed without using bedrails?: None Help needed moving from lying on your back to sitting on the side of a flat bed without using bedrails?: None Help needed moving to and from a bed to a chair (including a wheelchair)?: None Help needed standing up from a chair using your arms (e.g., wheelchair or bedside chair)?:  None Help needed to walk in hospital room?: None Help needed climbing 3-5 steps with a railing? : None 6 Click Score: 24    End of Session Equipment Utilized During Treatment: Gait belt Activity Tolerance: Patient tolerated treatment well;No increased pain Patient left: in bed;with call bell/phone within reach Nurse Communication: Mobility status PT Visit Diagnosis: Difficulty in walking, not elsewhere classified (R26.2);Unsteadiness on feet (R26.81)    Time: 0569-7948 PT Time Calculation (min) (ACUTE ONLY): 16 min   Charges:   PT Evaluation $PT Eval Low Complexity: 1 Low          Paisli Silfies R. Graylon Good, PT, DPT 01/26/22, 4:44 PM

## 2022-01-26 NOTE — Progress Notes (Addendum)
[facility] Progress Note  Patient: [name]    ZOX:[id number]  DOA: 01/24/2022     Date of Service: the patient was seen and examined on 01/26/2022  Chief Complaint  Patient presents with   Blurred Vision   Shortness of Breath   Brief hospital course: [name] is a 66 y.o. 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  Patient woke up with burning of left eye and felt tenderness in the left temporal area for the past 4 days which is gradually worsening.  Patient had some blurry vision which has improved.  Patient had fever at home 2 days ago.  Patient also complaining of bilateral chest wall pain and tenderness mostly on the right upper chest area of PPM insertion.  Abdominal pain especially in the lower abdomen, also noticed some black tarry stools for past few days, noticed some blood on wiping and abdominal pain after BM.  Patient is feeling sensation of being drunk and tipsy, does not drink.  Patient is also experiencing unsteady gait, wobbly and drunken gait for past few days. ED workup: Troponin negative x 2, CK3 17, BNP 69 within normal range Serum glucose 147, mild hyperglycemia, CRP 1.1 slightly elevated, vitamin B12 1009 elevated ESR 5, does not meet criteria for temporal arteritis CT Head: No acute finding or explanation for symptoms.  CTA H/N: 1. Negative CTA for large vessel occlusion or other emergent finding. 2. Atheromatous change about the carotid siphons with up to moderate stenoses bilaterally. 3. Mild atheromatous irregularity elsewhere about the major arterial vasculature of the head and neck. No other hemodynamically significant or correctable stenosis. CT A/P: No acute findings  within the abdomen or pelvis.  Aortic atherosclerosis.   Patient with multiple complaints, but reclarifying he presented to hospital for Left facial paresthesia, blurry vision, Left temporal skin area pain and dizziness. I have kindly requested neurological evaluation as I am not able to find organic source of his symptoms. He is requesting to restart "GABAPENTIN" and d/c lyrica, which we did today. Reportedly takes 800 TID but since he has not had this in a while, I restarted at 400 TID and will adjust as tolerated. I do not see where he has filled hydrocodone for pain in regular intervals. He tells me his PCP fills this. Per PDMP, he received and filled a 3 day supply of hydrocodone oral liquid on 11/25 but no other history to suggest regular use of this medication. Will hold off on restarting this medication at this time.   Assessment and Plan:  Left temporal pain, could be secondary to viral infection, presented here seems like neuropathy, no vesicles. Less likely temporal artery arteritis Discontinued gabapentin, started Lyrica 75 mg p.o. twice daily Continue Valtrex, x 10 days total he is tolerating well Started lidocaine patch topical Patient stated that he is allergic to prednisone, being sick in the past after taking prednisone but does not seem to be true allergy.  Patient agrees to try again if needs to... "Ophthalmology recommends 1g TID if HZO. Cool compresses, ophthalmic ointment, lubrication q1h, ointment qhs. Can follow up in Eye clinic on Tuesday if develops new symptoms." Follow serum HSV and CMV  Ataxia, unknown cause could  be due to gabapentin CT head and CTA head and neck negative for any acute findings Repeat CT scan after 24 hours MRI cannot be done due to PPM Curbside discussion done with neurology Dr. Su Monks, agreed with above management plan. Follow PT and OT eval Consider official consult by neurology if no improvement F/u repeat CT head  CAD s/p multiple  stents, HTN, paroxysmal A-fib, factor V Leiden deficiency S/p PPM Continued amlodipine, Lipitor, lisinopril, metoprolol, Plavix, Coumadin Pharmacy consulted for Coumadin dosing and INR monitoring  H/o GI bleeding and small bowel AVMs s/p clips Continued PPI 40 mg p.o. twice daily H&H stable, continue to monitor Hb daily History of black tarry stools and bleeding when wipes Check FOBT Consider GI consult if H&H drops and persistent bleeding   History of sarcoidosis, never treated.  Patient was recommended to follow as an outpatient.   Body mass index is 33.3 kg/m.  Interventions:       Diet: Regular diet DVT Prophylaxis: Therapeutic Anticoagulation with Coumadin    Advance goals of care discussion: Full code  Family Communication: family was not present at bedside, at the time of interview.  The pt provided permission to discuss medical plan with the family. Opportunity was given to ask question and all questions were answered satisfactorily.   Disposition:  Pt is from Home, admitted with Left temporal pain and left eye burning sensation, ataxic gait, black tarry stools, abdominal pain, still has same complaints, which precludes a safe discharge. Discharge to home, when medically stable, may need 2 to 3 days. Likely home tomorrow 12/25  Subjective:   Multiple complaints today. Reports left head pain on skin, left eye pain, not 100% improved. Left chest pain, similar to prior to admission. Requests Hydrocodone which he states he takes for chest pain at home. Discussed care plan with patient and nursing staff. ESR and CRP unremarkable. Troponin not elevated, EKG stable with low suspicion of ischemia.  Eoms intact.    Physical Exam: General:  alert oriented to time, place, and person.  Appear in mild distress, affect appropriate Eyes: PERRLA EOMS intact, ENT: Oral Mucosa Clear, moist  Neck: no JVD,  Cardiovascular: S1 and S2 Present, no Murmur,  Respiratory: good  respiratory effort, Bilateral Air entry equal and Decreased, no Crackles, no wheezes Abdomen: Bowel Sound present, Soft and mild lower abd tenderness,  Skin: no rashes or vesicles, mild tenderness on the left temporal area Extremities: no Pedal edema, no calf tenderness Neurologic: without any new focal findings Gait not checked due to patient safety concerns  Vitals:   01/25/22 1734 01/25/22 2044 01/25/22 2312 01/26/22 0959  BP: 122/75 124/80 110/81 115/73  Pulse: 73 80 79 68  Resp: (!) _0 Temp: 98.4 F (36.9 C) 98.7 F (37.1 C) 98.2 F (36.8 C) (!) 97.5 F (36.4 C)  TempSrc: Oral Oral    SpO2: 100% 92% 99% 100%  Weight:   93.6 kg   Height:   5' 6" (1.676 m)     Intake/Output Summary (Last 24 hours) at 01/26/2022 1221 Last data filed at 01/26/2022 1047 Gross per 24 hour  Intake 120 ml  Output --  Net 120 ml    Filed Weights   01/24/22 1454 01/25/22 2312  Weight: 88 kg 93.6 kg    Data Reviewed: I have personally reviewed and interpreted daily labs, tele strips, imagings as discussed above. I reviewed all nursing notes, pharmacy notes, vitals, pertinent old records I have discussed plan of  care as described above with RN and patient/family.  CBC: Recent Labs  Lab 01/24/22 1459 01/25/22 0502 01/26/22 0503  WBC 5.7 5.6 6.3  NEUTROABS 3.8  --   --   HGB 12.6* 11.9* 12.7*  HCT 41.0 38.5* 40.2  MCV 84.7 85.0 84.3  PLT 173 158 045    Basic Metabolic Panel: Recent Labs  Lab 01/24/22 1459 01/26/22 0503  NA 136 138  K 3.9 3.7  CL 107 108  CO2 24 24  GLUCOSE 147* 125*  BUN 9 11  CREATININE 1.11 0.85  CALCIUM 8.9 8.4*  MG  --  2.1  PHOS  --  3.2     Studies: DG Chest Port 1 View  Result Date: 01/26/2022 CLINICAL DATA:  Chest pain EXAM: PORTABLE CHEST 1 VIEW COMPARISON:  01/24/2022 FINDINGS: Stable asymmetric elevation right hemidiaphragm. The lungs are clear without focal pneumonia, edema, pneumothorax or pleural effusion. Cardiopericardial  silhouette is at upper limits of normal for size. Right-sided permanent pacemaker/AICD noted. The visualized bony structures of the thorax are unremarkable. IMPRESSION: Stable exam. No acute cardiopulmonary findings. Electronically Signed   By: Misty Stanley M.D.   On: 01/26/2022 12:15   CT HEAD WO CONTRAST (5MM)  Result Date: 01/25/2022 CLINICAL DATA:  Headache, dizziness, blurred vision left eye EXAM: CT HEAD WITHOUT CONTRAST TECHNIQUE: Contiguous axial images were obtained from the base of the skull through the vertex without intravenous contrast. RADIATION DOSE REDUCTION: This exam was performed according to the departmental dose-optimization program which includes automated exposure control, adjustment of the mA and/or kV according to patient size and/or use of iterative reconstruction technique. COMPARISON:  01/24/2022 FINDINGS: Brain: Stable scattered hypodensities throughout the periventricular and subcortical white matter consistent with chronic small vessel ischemic change. No evidence of acute infarct or hemorrhage. Lateral ventricles and midline structures are stable. No acute extra-axial fluid collections. No mass effect. Vascular: Stable atherosclerosis.  No hyperdense vessel. Skull: Normal. Negative for fracture or focal lesion. Sinuses/Orbits: No acute finding. Other: None. IMPRESSION: 1. Stable head CT, no acute intracranial process. Electronically Signed   By: Randa Ngo M.D.   On: 01/25/2022 17:49    Scheduled Meds:  amLODipine  5 mg Oral BID   artificial tears   Both Eyes Q1H   atorvastatin  80 mg Oral Daily   bacitracin   Topical BID   clopidogrel  75 mg Oral Daily   finasteride  5 mg Oral Daily   ipratropium-albuterol  3 mL Nebulization Q4H   lidocaine  1 patch Transdermal Q24H   lisinopril  20 mg Oral Daily   magnesium oxide  400 mg Oral Daily   metoprolol succinate  25 mg Oral Daily   pantoprazole  40 mg Oral BID   potassium chloride SA  20 mEq Oral Daily   pregabalin   75 mg Oral BID   sodium chloride flush  3 mL Intravenous Q12H   valACYclovir  1,000 mg Oral TID   warfarin  7.5 mg Oral q1600   Warfarin - Pharmacist Dosing Inpatient   Does not apply q1600   zolpidem  10 mg Oral QHS   Continuous Infusions: PRN Meds: acetaminophen, albuterol, nicotine polacrilex, ondansetron (ZOFRAN) IV  Time spent: 50 minutes  Author: Vanna Scotland, MD [facility] 01/26/2022 12:21 PM  To reach On-call, see care teams to locate the attending and reach out to them via [email or web address]. If 7PM-7AM, please contact night-coverage If you still have difficulty reaching the attending provider, please page the [facility] (Director  on Call) for [facility] on amion for assistance.

## 2022-01-26 NOTE — Progress Notes (Signed)
ANTICOAGULATION CONSULT NOTE - Initial Consult  Pharmacy Consult for Warfarin  Indication: atrial fibrillation  Allergies  Allergen Reactions   Celecoxib Other (See Comments)    Other reaction(s): GI Bleed, Other - See Comments GI BLEEDING Other Reaction: GI BLEEDING GI bleeding    Ciprofloxacin Anaphylaxis and Other (See Comments)    Other reaction(s): Other - See Comments Pt went into At Fib Other Reaction: PUT PT IN A FIB    Dicyclomine     Other reaction(s): chest pain   Hydromorphone Nausea And Vomiting    Other reaction(s): Vomiting  Nausea and vomiting   Oxycodone-Acetaminophen Nausea And Vomiting    Other reaction(s): Vomiting   Ranolazine Anaphylaxis    Patient describes abdominal swelling/bloating that radiates to neck/throat Patient describes abdominal swelling/bloating that radiates to neck/throat    Rivaroxaban Rash    Other reaction(s): Other (See Comments), Other - See Comments SEVERE RECTAL BLEED Sever rectal bleeding Sever rectal bleeding SEVERE RECTAL BLEED    Ezetimibe Diarrhea    Other reaction(s): cough, Other - See Comments Cough Cough    Pregabalin Other (See Comments)    "weight gain"  Weight gain   Apixaban    Dabigatran Etexilate Mesilate [Dabigatran]    Dabigatran Etexilate Mesylate Other (See Comments)   Dilaudid [Hydromorphone Hcl]    Fentanyl     Nauseated and makes pt jittery   Nauseated and makes pt jittery   Nitroglycerin    Other     Other reaction(s): Hives, Itching Pt states he breaks out in red bumps with adhesive materials (ex: patches)   Prednisone    Tramadol     Patient Measurements: Height: 5\' 6"  (167.6 cm) Weight: 93.6 kg (206 lb 5.6 oz) IBW/kg (Calculated) : 63.8 Heparin Dosing Weight:   Vital Signs: Temp: 97.5 F (36.4 C) (12/24 0959) BP: 115/73 (12/24 0959) Pulse Rate: 68 (12/24 0959)  Labs: Recent Labs    01/24/22 1459 01/24/22 1827 01/25/22 0502 01/26/22 0503 01/26/22 1032 01/26/22 1140   HGB 12.6*  --  11.9* 12.7*  --   --   HCT 41.0  --  38.5* 40.2  --   --   PLT 173  --  158 165  --   --   LABPROT 29.8*  --  26.8*  --   --  19.7*  INR 2.9*  --  2.5*  --   --  1.7*  CREATININE 1.11  --   --  0.85  --   --   CKTOTAL  --  317  --   --   --   --   TROPONINIHS 7 7  --   --  7  --      Estimated Creatinine Clearance: 94 mL/min (by C-G formula based on SCr of 0.85 mg/dL).   Medical History: Past Medical History:  Diagnosis Date   Atrial fibrillation (HCC)    CHF (congestive heart failure) (HCC)    Diabetes mellitus without complication (HCC)    Factor 5 Leiden mutation, heterozygous (HCC)    Hypertension    Pacemaker    Sarcoidosis     Medications:  Medications Prior to Admission  Medication Sig Dispense Refill Last Dose   albuterol (VENTOLIN HFA) 108 (90 Base) MCG/ACT inhaler Inhale 2 puffs into the lungs every 4 (four) hours as needed.   prn at prn   amLODipine (NORVASC) 2.5 MG tablet Take 2.5 mg by mouth 2 (two) times daily.   01/24/2022 at 0930   ascorbic acid (  VITAMIN C) 250 MG tablet Take 250 mg by mouth daily.   01/24/2022 at 0930   atorvastatin (LIPITOR) 80 MG tablet Take 80 mg by mouth daily.   01/24/2022 at 0930   cholecalciferol (VITAMIN D3) 10 MCG (400 UNIT) TABS tablet Take 400 Units by mouth daily.   01/24/2022 at 0930   clopidogrel (PLAVIX) 75 MG tablet Take 75 mg by mouth daily.   01/24/2022 at 0930   cyanocobalamin (VITAMIN B12) 1000 MCG tablet Take 1,000 mcg by mouth daily.   01/24/2022 at 0930   finasteride (PROSCAR) 5 MG tablet Take 5 mg by mouth daily.   01/24/2022 at 0930   gabapentin (NEURONTIN) 800 MG tablet Take 800 mg by mouth 3 (three) times daily. Take 800 mg by mouth in the morning, at noon, and at bedtime.   01/24/2022 at 0930   HYDROcodone-acetaminophen (HYCET) 7.5-325 mg/15 ml solution Take 7.5 mLs by mouth as directed.   prn at prn   ipratropium-albuterol (DUONEB) 0.5-2.5 (3) MG/3ML SOLN Take 3 mLs by nebulization every 6 (six)  hours.   prn at prn   lisinopril (ZESTRIL) 2.5 MG tablet Take 2.5 mg by mouth daily.   01/24/2022 at 0930   magnesium oxide (MAG-OX) 400 (240 Mg) MG tablet Take 400 mg by mouth daily.   01/24/2022 at 0930   metoprolol succinate (TOPROL-XL) 100 MG 24 hr tablet Take 100 mg by mouth daily.   01/24/2022 at 0930   nicotine polacrilex (NICORETTE) 4 MG gum Take 4 mg by mouth as needed for smoking cessation.   prn at prn   pantoprazole (PROTONIX) 40 MG tablet Take 40 mg by mouth 2 (two) times daily.   01/24/2022 at 0930   potassium chloride SA (KLOR-CON M20) 20 MEQ tablet Take 20 mEq by mouth daily.   01/24/2022 at 0930   tadalafil (CIALIS) 20 MG tablet Take 20 mg by mouth daily as needed for erectile dysfunction.   prn at prn   tamsulosin (FLOMAX) 0.4 MG CAPS capsule Take 0.4 mg by mouth daily.   01/24/2022 at 0930   warfarin (COUMADIN) 7.5 MG tablet Take 7.5 mg by mouth daily.   01/24/2022 at 0930   zolpidem (AMBIEN) 10 MG tablet Take 10 mg by mouth at bedtime.   01/23/2022 at 2200   clotrimazole (LOTRIMIN) 1 % cream Apply 1 Application topically 2 (two) times daily as needed. (Patient not taking: Reported on 01/24/2022)   Not Taking   gabapentin (NEURONTIN) 600 MG tablet Take 600 mg by mouth in the morning, at noon, and at bedtime. (Patient not taking: Reported on 01/24/2022)   Not Taking   oxyCODONE-acetaminophen (PERCOCET) 10-325 MG tablet Take 1 tablet by mouth every 6 (six) hours as needed for pain. (Patient not taking: Reported on 01/24/2022)   Not Taking    Assessment: Pharmacy consulted to dose warfarin for Afib in this 66 year old male admitted for possible stroke.  Pt was on warfarin 7.5 mg PO daily PTA, last dose on 12/22.    12/22: INR = 2.9 12/23: INR = 2.5  12/24: INR = 1.7  - Of Note:  Pt states he has "home INR machine" and checks and adjusts his home warfarin dose based on readings from home INR machine.   Pt needs to be counseled regarding appropriateness of this practice.   Goal  of Therapy:  INR 2-3   Plan:  - Warfarin 10mg  x 1 today - INR/CBC daily - Will need to counsel pt regarding dangers of  self-titration of warfarin dose based on readings from "home INR machine" .   Milika Ventress A Herman Fiero 01/26/2022,12:19 PM

## 2022-01-27 ENCOUNTER — Other Ambulatory Visit: Payer: Self-pay

## 2022-01-27 DIAGNOSIS — R269 Unspecified abnormalities of gait and mobility: Secondary | ICD-10-CM

## 2022-01-27 DIAGNOSIS — Z1152 Encounter for screening for COVID-19: Secondary | ICD-10-CM | POA: Diagnosis not present

## 2022-01-27 DIAGNOSIS — R519 Headache, unspecified: Secondary | ICD-10-CM | POA: Diagnosis present

## 2022-01-27 DIAGNOSIS — Z95810 Presence of automatic (implantable) cardiac defibrillator: Secondary | ICD-10-CM | POA: Diagnosis not present

## 2022-01-27 DIAGNOSIS — H5712 Ocular pain, left eye: Secondary | ICD-10-CM | POA: Diagnosis present

## 2022-01-27 DIAGNOSIS — I2510 Atherosclerotic heart disease of native coronary artery without angina pectoris: Secondary | ICD-10-CM | POA: Diagnosis present

## 2022-01-27 DIAGNOSIS — I482 Chronic atrial fibrillation, unspecified: Secondary | ICD-10-CM | POA: Diagnosis present

## 2022-01-27 DIAGNOSIS — R2681 Unsteadiness on feet: Secondary | ICD-10-CM | POA: Diagnosis not present

## 2022-01-27 DIAGNOSIS — D6851 Activated protein C resistance: Secondary | ICD-10-CM | POA: Diagnosis present

## 2022-01-27 DIAGNOSIS — E669 Obesity, unspecified: Secondary | ICD-10-CM | POA: Diagnosis present

## 2022-01-27 DIAGNOSIS — R700 Elevated erythrocyte sedimentation rate: Secondary | ICD-10-CM | POA: Diagnosis present

## 2022-01-27 DIAGNOSIS — K625 Hemorrhage of anus and rectum: Secondary | ICD-10-CM | POA: Diagnosis not present

## 2022-01-27 DIAGNOSIS — I480 Paroxysmal atrial fibrillation: Secondary | ICD-10-CM | POA: Diagnosis present

## 2022-01-27 DIAGNOSIS — R079 Chest pain, unspecified: Secondary | ICD-10-CM | POA: Diagnosis not present

## 2022-01-27 DIAGNOSIS — E785 Hyperlipidemia, unspecified: Secondary | ICD-10-CM | POA: Diagnosis present

## 2022-01-27 DIAGNOSIS — Z6833 Body mass index (BMI) 33.0-33.9, adult: Secondary | ICD-10-CM | POA: Diagnosis not present

## 2022-01-27 DIAGNOSIS — R072 Precordial pain: Secondary | ICD-10-CM | POA: Diagnosis present

## 2022-01-27 DIAGNOSIS — H539 Unspecified visual disturbance: Secondary | ICD-10-CM

## 2022-01-27 DIAGNOSIS — R0602 Shortness of breath: Secondary | ICD-10-CM | POA: Diagnosis present

## 2022-01-27 DIAGNOSIS — D869 Sarcoidosis, unspecified: Secondary | ICD-10-CM | POA: Diagnosis present

## 2022-01-27 DIAGNOSIS — I442 Atrioventricular block, complete: Secondary | ICD-10-CM | POA: Diagnosis present

## 2022-01-27 DIAGNOSIS — I509 Heart failure, unspecified: Secondary | ICD-10-CM | POA: Diagnosis present

## 2022-01-27 DIAGNOSIS — R270 Ataxia, unspecified: Secondary | ICD-10-CM | POA: Diagnosis present

## 2022-01-27 DIAGNOSIS — I495 Sick sinus syndrome: Secondary | ICD-10-CM | POA: Diagnosis present

## 2022-01-27 DIAGNOSIS — H5310 Unspecified subjective visual disturbances: Secondary | ICD-10-CM | POA: Diagnosis present

## 2022-01-27 DIAGNOSIS — I4819 Other persistent atrial fibrillation: Secondary | ICD-10-CM | POA: Diagnosis not present

## 2022-01-27 DIAGNOSIS — E1165 Type 2 diabetes mellitus with hyperglycemia: Secondary | ICD-10-CM | POA: Diagnosis present

## 2022-01-27 DIAGNOSIS — K921 Melena: Secondary | ICD-10-CM | POA: Diagnosis present

## 2022-01-27 DIAGNOSIS — K5520 Angiodysplasia of colon without hemorrhage: Secondary | ICD-10-CM | POA: Diagnosis present

## 2022-01-27 DIAGNOSIS — I110 Hypertensive heart disease with heart failure: Secondary | ICD-10-CM | POA: Diagnosis present

## 2022-01-27 LAB — CREATININE, SERUM
Creatinine, Ser: 0.98 mg/dL (ref 0.61–1.24)
GFR, Estimated: >60 mL/min (ref >60)

## 2022-01-27 LAB — C-REACTIVE PROTEIN: CRP: 0.9 mg/dL (ref <1.0)

## 2022-01-27 LAB — PROTIME-INR
INR: 1.9 — ABNORMAL HIGH (ref 0.8–1.2)
Prothrombin Time: 21.3 seconds — ABNORMAL HIGH (ref 11.4–15.2)

## 2022-01-27 LAB — SEDIMENTATION RATE: Sed Rate: 4 mm/hr (ref 0–20)

## 2022-01-27 MED ORDER — METOPROLOL TARTRATE 5 MG/5ML IV SOLN
5.0000 mg | Freq: Once | INTRAVENOUS | Status: AC
  Administered 2022-01-27: 5 mg via INTRAVENOUS
  Filled 2022-01-27: qty 5

## 2022-01-27 MED ORDER — WARFARIN SODIUM 7.5 MG PO TABS
7.5000 mg | ORAL_TABLET | Freq: Once | ORAL | Status: AC
  Administered 2022-01-27: 7.5 mg via ORAL
  Filled 2022-01-27: qty 1

## 2022-01-27 MED ORDER — METOPROLOL TARTRATE 5 MG/5ML IV SOLN
5.0000 mg | Freq: Four times a day (QID) | INTRAVENOUS | Status: DC | PRN
  Administered 2022-01-27 – 2022-01-28 (×2): 5 mg via INTRAVENOUS
  Filled 2022-01-27 (×2): qty 5

## 2022-01-27 NOTE — Progress Notes (Signed)
[facility] Progress Note  Patient: [name]    RCV:[id number]  DOA: 01/24/2022     Date of Service: the patient was seen and examined on 01/27/2022  Chief Complaint  Patient presents with   Blurred Vision   Shortness of Breath   Brief hospital course: [name] is a 66 y.o. 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  Patient woke up with burning of left eye and felt tenderness in the left temporal area for the past 4 days which is gradually worsening.  Patient had some blurry vision which has improved.  Patient had fever at home 2 days ago.  Patient also complaining of bilateral chest wall pain and tenderness mostly on the right upper chest area of PPM insertion.  Abdominal pain especially in the lower abdomen, also noticed some black tarry stools for past few days, noticed some blood on wiping and abdominal pain after BM.  Patient is feeling sensation of being drunk and tipsy, does not drink.  Patient is also experiencing unsteady gait, wobbly and drunken gait for past few days. ED workup: Troponin negative x 2, CK3 17, BNP 69 within normal range Serum glucose 147, mild hyperglycemia, CRP 1.1 slightly elevated, vitamin B12 1009 elevated ESR 5, does not meet criteria for temporal arteritis CT Head: No acute finding or explanation for symptoms.  CTA H/N: 1. Negative CTA for large vessel occlusion or other emergent finding. 2. Atheromatous change about the carotid siphons with up to moderate stenoses bilaterally. 3. Mild atheromatous irregularity elsewhere about the major arterial vasculature of the head and neck. No other hemodynamically significant or correctable stenosis. CT A/P: No acute findings  within the abdomen or pelvis.  Aortic atherosclerosis.   Patient with multiple complaints, but reclarifying [name] presented to hospital for Left facial paresthesia, blurry vision, Left temporal skin area pain and dizziness. I have kindly requested neurological evaluation as I am not able to find organic source of his symptoms. [name] is requesting to restart "GABAPENTIN" and d/c lyrica, which we did today. Reportedly takes 800 TID but since [name] has not had this in a while, I restarted at 400 TID and will adjust as tolerated. I do not see where [name] has filled hydrocodone for pain in regular intervals. [name] tells me his PCP fills this. Per PDMP, [name] received and filled a 3 day supply of hydrocodone oral liquid on 11/25 but no other history to suggest regular use of this medication. Will hold off on restarting this medication at this time.   Assessment and Plan:  Left temporal pain, could be secondary to viral infection, presented here seems like neuropathy, no vesicles. Less likely temporal artery arteritis Discontinued gabapentin, started Lyrica 75 mg p.o. twice daily D/c valtrex and started on Acyclovir per Neurology Started on Dexamethasone per Neurology D/c lyrica, and restarted home dose gabapentin Patient requesting "home" dose hydrocodone which I do not see is a chronic home med but will restart given c/o pain - lidocaine patch topical Per secure chat recommendations "Ophthalmology recommends 1g TID if HZO. Cool compresses, ophthalmic ointment, lubrication q1h, ointment qhs. Can follow up in Eye clinic on Tuesday if develops new symptoms." Follow serum HSV and CMV - per neurology,  recommending formal Optho consult, and I will reach out to them today  Ataxia, unknown cause could be due to gabapentin CT head and CTA head and neck negative for any acute findings Repeat CT scan after 24 hours MRI cannot be done due to PPM Curbside discussion done with neurology Dr. Su Monks, agreed with above  management plan. Follow PT and OT eval Consider official consult by neurology if no improvement Repeat CT head no acute abnormalities  CAD s/p multiple stents, HTN, paroxysmal A-fib, factor V Leiden deficiency S/p PPM Continued amlodipine, Lipitor, lisinopril, metoprolol, Plavix, Coumadin Pharmacy consulted for Coumadin dosing and INR monitoring  H/o GI bleeding and small bowel AVMs s/p clips Continued PPI 40 mg p.o. twice daily H&H stable, continue to monitor Hb daily History of black tarry stools and bleeding when wipes Check FOBT Consider GI consult if H&H drops and persistent bleeding   History of sarcoidosis, never treated.  Patient was recommended to follow as an outpatient.   Body mass index is 33.3 kg/m.  Interventions:       Diet: Regular diet DVT Prophylaxis: Therapeutic Anticoagulation with Coumadin    Advance goals of care discussion: Full code  Family Communication: family was not present at bedside, at the time of interview.  The pt provided permission to discuss medical plan with the family. Opportunity was given to ask question and all questions were answered satisfactorily.   Disposition:  Pt is from Home, admitted with Left temporal pain and left eye burning sensation, ataxic gait, black tarry stools, abdominal pain, still has same complaints, which precludes a safe discharge. Discharge to home, hoping in 24H  Subjective:  [name] is tolerating steroids well. Denies dysphagia, hives, rash. Denies acute neurological changes We discussed readiness for discharge. [name] states [name] is not ready for dc yet, still having dizziness and difficulty walking. I discussed neurology plan and recommendations with him today in shared decision making. We will continue current therapy plans.    Physical Exam: General:  alert oriented to time, place, and person.  Appear in mild distress, affect appropriate Eyes: PERRLA EOMS intact, ENT: Oral Mucosa Clear, moist  Neck: no JVD,   Cardiovascular: S1 and S2 Present, no Murmur,  Respiratory: good respiratory effort, Bilateral Air entry equal and Decreased, no Crackles, no wheezes Abdomen: Bowel Sound present, Soft and mild lower abd tenderness,  Skin: no rashes or vesicles, mild tenderness on the left temporal area Extremities: no Pedal edema, no calf tenderness Neurologic: without any new focal findings Gait not checked due to patient safety concerns  Vitals:   01/26/22 2156 01/27/22 0627 01/27/22 0800 01/27/22 0909  BP: 110/63 113/70 114/85 117/88  Pulse: 66 71 75   Resp: _0 Temp: 97.7 F (36.5 C) 97.9 F (36.6 C) 97.7 F (36.5 C)   TempSrc: Oral     SpO2: 98% 94% 97%   Weight:      Height:        Intake/Output Summary (Last 24 hours) at 01/27/2022 1126 Last data filed at 01/27/2022 1103 Gross per 24 hour  Intake --  Output 1900 ml  Net -1900 ml    Filed Weights   01/24/22 1454 01/25/22 2312  Weight: 88 kg 93.6 kg    Data Reviewed: I have personally reviewed and interpreted daily labs, tele strips, imagings as discussed above. I reviewed all nursing notes, pharmacy notes, vitals, pertinent old records I have discussed plan of care as described above with RN and patient/family.  CBC: Recent Labs  Lab 01/24/22 1459 01/25/22 0502 01/26/22 0503  WBC 5.7 5.6 6.3  NEUTROABS 3.8  --   --   HGB 12.6* 11.9* 12.7*  HCT 41.0 38.5* 40.2  MCV 84.7 85.0 84.3  PLT 173 158 795    Basic Metabolic Panel: Recent Labs  Lab 01/24/22 1459 01/26/22 0503 01/27/22 0549  NA 136 138  --   K 3.9 3.7  --   CL 107 108  --   CO2 24 24  --   GLUCOSE 147* 125*  --   BUN 9 11  --   CREATININE 1.11 0.85 0.98  CALCIUM 8.9 8.4*  --   MG  --  2.1  --   PHOS  --  3.2  --      Studies: No results found.  Scheduled Meds:  amLODipine  5 mg Oral BID   artificial tears   Both Eyes Q1H   ascorbic acid  250 mg Oral Daily   atorvastatin  80 mg Oral Daily   bacitracin   Topical BID   cholecalciferol   1,000 Units Oral Daily   clopidogrel  75 mg Oral Daily   cyanocobalamin  1,000 mcg Oral Daily   dexamethasone (DECADRON) injection  8 mg Intravenous Q24H   finasteride  5 mg Oral Daily   gabapentin  800 mg Oral TID   lidocaine  1 patch Transdermal Q24H   lisinopril  20 mg Oral Daily   magnesium oxide  400 mg Oral Daily   metoprolol succinate  25 mg Oral Daily   pantoprazole  40 mg Oral BID   potassium chloride SA  20 mEq Oral Daily   sodium chloride flush  3 mL Intravenous Q12H   warfarin  7.5 mg Oral ONCE-1600   Warfarin - Pharmacist Dosing Inpatient   Does not apply q1600   zolpidem  10 mg Oral QHS   Continuous Infusions:  acyclovir 760 mg (01/27/22 0923)   PRN Meds: acetaminophen, albuterol, HYDROcodone-acetaminophen, nicotine polacrilex, ondansetron (ZOFRAN) IV  Time spent: 50 minutes  Author: Vanna Scotland, MD [facility] 01/27/2022 11:26 AM  To reach On-call, see care teams to locate the attending and reach out to them via [email or web address]. If 7PM-7AM, please contact night-coverage If you still have difficulty reaching the attending provider, please page the [facility] (Director on Call) for [facility] on amion for assistance.

## 2022-01-27 NOTE — Progress Notes (Addendum)
       CROSS COVER NOTE  NAME: [name] MRN: [id number] DOB : 07-May-1955 ATTENDING PHYSICIAN: Thomas Hoff, MD    Date of Service   01/27/2022   HPI/Events of Note   Message received from RN. [name] is reporting 10/10 epigastric chest pain with radiation to (L) arm. RN reports HR is fluctuating and is as high as 150.  On bedside evaluation [name] reports 10/10 substernal chest pain described as sharp and shooting that began around 1905 per patient. The pain is not reproducible on exam. [name] endorses dyspnea (present on arrival to [facility] ED and no change in dyspnea) and radiation of the pain down his (L) arm with associated numbness. He declines to answer additional questions and declines auscultation of lungs sounds/precordium at this time citing he is having a heart attack.  On review of outside records [name] has had recent ED visits for AFIB w RVR that converted with IV metoprolol. Recent ablation in September Previously required sotalol  carotid upstroke normal without bruit, jugular venous pressure is normal  EKG Troponin, TSH, Lipid Panel, BNP, A1C PRN Nitroglycerin IV Morphine (Toradol, Robaxin, Lidocaine patch for reproducible pain) 325 mg of Aspirin IV Morphine Supplemental Oxygen. Consider neb for CP 2/2 demand   Interventions   Assessment/Plan:  EKG-->HR 148  Atrial Fibrillation with Rapid Ventricular Response PRN 5 mg IV metoprolol Telemetry X        To reach the provider On-Call:   7AM- 7PM see care teams to locate the attending and reach out to them via [email or web address]. 7PM-7AM contact night-coverage If you still have difficulty reaching the appropriate provider, please page the [facility] (Director on Call) for [facility] on amion for assistance  This document was prepared using Sales executive software and may include unintentional dictation errors.  Bishop Limbo DNP, MBA, FNP-BC Nurse Practitioner [facility] Pager [phone]

## 2022-01-27 NOTE — Progress Notes (Signed)
ANTICOAGULATION CONSULT NOTE - Initial Consult  Pharmacy Consult for Warfarin  Indication: atrial fibrillation  Allergies  Allergen Reactions   Celecoxib Other (See Comments)    Other reaction(s): GI Bleed, Other - See Comments GI BLEEDING Other Reaction: GI BLEEDING GI bleeding    Ciprofloxacin Anaphylaxis and Other (See Comments)    Other reaction(s): Other - See Comments Pt went into At Fib Other Reaction: PUT PT IN A FIB    Dicyclomine     Other reaction(s): chest pain   Hydromorphone Nausea And Vomiting    Other reaction(s): Vomiting  Nausea and vomiting   Oxycodone-Acetaminophen Nausea And Vomiting    Other reaction(s): Vomiting   Ranolazine Anaphylaxis    Patient describes abdominal swelling/bloating that radiates to neck/throat Patient describes abdominal swelling/bloating that radiates to neck/throat    Rivaroxaban Rash    Other reaction(s): Other (See Comments), Other - See Comments SEVERE RECTAL BLEED Sever rectal bleeding Sever rectal bleeding SEVERE RECTAL BLEED    Ezetimibe Diarrhea    Other reaction(s): cough, Other - See Comments Cough Cough    Pregabalin Other (See Comments)    "weight gain"  Weight gain   Apixaban    Dabigatran Etexilate Mesilate [Dabigatran]    Dabigatran Etexilate Mesylate Other (See Comments)   Dilaudid [Hydromorphone Hcl]    Fentanyl     Nauseated and makes pt jittery   Nauseated and makes pt jittery   Nitroglycerin    Other     Other reaction(s): Hives, Itching Pt states he breaks out in red bumps with adhesive materials (ex: patches)   Prednisone     Doesn't recall reaction but comfortable trying decadron inpatient while monitored. MD aware.    Tramadol     Patient Measurements: Height: 5\' 6"  (167.6 cm) Weight: 93.6 kg (206 lb 5.6 oz) IBW/kg (Calculated) : 63.8 Heparin Dosing Weight:   Vital Signs: Temp: 97.9 F (36.6 C) (12/25 0627) Temp Source: Oral (12/24 2156) BP: 113/70 (12/25 0627) Pulse Rate: 71  (12/25 0627)  Labs: Recent Labs    01/24/22 1459 01/24/22 1827 01/25/22 0502 01/26/22 0458 01/26/22 0503 01/26/22 1032 01/26/22 1140 01/27/22 0549  HGB 12.6*  --  11.9*  --  12.7*  --   --   --   HCT 41.0  --  38.5*  --  40.2  --   --   --   PLT 173  --  158  --  165  --   --   --   LABPROT 29.8*  --  26.8*  --   --   --  19.7* 21.3*  INR 2.9*  --  2.5*  --   --   --  1.7* 1.9*  CREATININE 1.11  --   --   --  0.85  --   --  0.98  CKTOTAL  --  317  --   --   --   --   --   --   TROPONINIHS 7 7  --  6  --  7  --   --      Estimated Creatinine Clearance: 81.5 mL/min (by C-G formula based on SCr of 0.98 mg/dL).   Medical History: Past Medical History:  Diagnosis Date   Atrial fibrillation (HCC)    CHF (congestive heart failure) (HCC)    Diabetes mellitus without complication (HCC)    Factor 5 Leiden mutation, heterozygous (HCC)    Hypertension    Pacemaker    Sarcoidosis  Medications:  Medications Prior to Admission  Medication Sig Dispense Refill Last Dose   albuterol (VENTOLIN HFA) 108 (90 Base) MCG/ACT inhaler Inhale 2 puffs into the lungs every 4 (four) hours as needed.   prn at prn   amLODipine (NORVASC) 2.5 MG tablet Take 2.5 mg by mouth 2 (two) times daily.   01/24/2022 at 0930   ascorbic acid (VITAMIN C) 250 MG tablet Take 250 mg by mouth daily.   01/24/2022 at 0930   atorvastatin (LIPITOR) 80 MG tablet Take 80 mg by mouth daily.   01/24/2022 at 0930   cholecalciferol (VITAMIN D3) 10 MCG (400 UNIT) TABS tablet Take 400 Units by mouth daily.   01/24/2022 at 0930   clopidogrel (PLAVIX) 75 MG tablet Take 75 mg by mouth daily.   01/24/2022 at 0930   cyanocobalamin (VITAMIN B12) 1000 MCG tablet Take 1,000 mcg by mouth daily.   01/24/2022 at 0930   finasteride (PROSCAR) 5 MG tablet Take 5 mg by mouth daily.   01/24/2022 at 0930   gabapentin (NEURONTIN) 800 MG tablet Take 800 mg by mouth 3 (three) times daily. Take 800 mg by mouth in the morning, at noon, and at  bedtime.   01/24/2022 at 0930   HYDROcodone-acetaminophen (HYCET) 7.5-325 mg/15 ml solution Take 7.5 mLs by mouth as directed.   prn at prn   ipratropium-albuterol (DUONEB) 0.5-2.5 (3) MG/3ML SOLN Take 3 mLs by nebulization every 6 (six) hours.   prn at prn   lisinopril (ZESTRIL) 2.5 MG tablet Take 2.5 mg by mouth daily.   01/24/2022 at 0930   magnesium oxide (MAG-OX) 400 (240 Mg) MG tablet Take 400 mg by mouth daily.   01/24/2022 at 0930   metoprolol succinate (TOPROL-XL) 100 MG 24 hr tablet Take 100 mg by mouth daily.   01/24/2022 at 0930   nicotine polacrilex (NICORETTE) 4 MG gum Take 4 mg by mouth as needed for smoking cessation.   prn at prn   pantoprazole (PROTONIX) 40 MG tablet Take 40 mg by mouth 2 (two) times daily.   01/24/2022 at 0930   potassium chloride SA (KLOR-CON M20) 20 MEQ tablet Take 20 mEq by mouth daily.   01/24/2022 at 0930   tadalafil (CIALIS) 20 MG tablet Take 20 mg by mouth daily as needed for erectile dysfunction.   prn at prn   tamsulosin (FLOMAX) 0.4 MG CAPS capsule Take 0.4 mg by mouth daily.   01/24/2022 at 0930   warfarin (COUMADIN) 7.5 MG tablet Take 7.5 mg by mouth daily.   01/24/2022 at 0930   zolpidem (AMBIEN) 10 MG tablet Take 10 mg by mouth at bedtime.   01/23/2022 at 2200   clotrimazole (LOTRIMIN) 1 % cream Apply 1 Application topically 2 (two) times daily as needed. (Patient not taking: Reported on 01/24/2022)   Not Taking   gabapentin (NEURONTIN) 600 MG tablet Take 600 mg by mouth in the morning, at noon, and at bedtime. (Patient not taking: Reported on 01/24/2022)   Not Taking   oxyCODONE-acetaminophen (PERCOCET) 10-325 MG tablet Take 1 tablet by mouth every 6 (six) hours as needed for pain. (Patient not taking: Reported on 01/24/2022)   Not Taking    Assessment: Pharmacy consulted to dose warfarin for Afib in this 66 year old male admitted for possible stroke.  Pt was on warfarin 7.5 mg PO daily PTA, last dose on 12/22.    12/22: INR = 2.9 12/23: INR =  2.5  12/24: INR = 1.7 12/25: INR = 1.9  -  Of Note:  Pt states he has "home INR machine" and checks and adjusts his home warfarin dose based on readings from home INR machine.   Pt needs to be counseled regarding appropriateness of this practice.   Goal of Therapy:  INR 2-3   Plan:  - Warfarin 7.5mg  x 1 today - INR/CBC daily - Will need to counsel pt regarding dangers of self-titration of warfarin dose based on readings from "home INR machine" .   Cleophus Mendonsa A Kaylen Motl 01/27/2022,7:36 AM

## 2022-01-27 NOTE — Progress Notes (Addendum)
Subjective: Some improvement in symptoms today. No vesicular eruptions, but patient states that there are 2 new small hyperpigmented spots along his left scalp that are several days old.   Objective: Current vital signs: BP 117/88 (BP Location: Right Arm)   Pulse 75   Temp 97.7 F (36.5 C)   Resp 16   Ht _0  (1.676 m)   Wt 93.6 kg   SpO2 97%   BMI 33.31 kg/m  Vital signs in last 24 hours: Temp:  [97.7 F (36.5 C)-97.9 F (36.6 C)] 97.7 F (36.5 C) (12/25 0800) Pulse Rate:  [66-75] 75 (12/25 0800) Resp:  [16] 16 (12/25 0800) BP: (110-117)/(63-88) 117/88 (12/25 0909) SpO2:  [94 %-98 %] 97 % (12/25 0800)  Intake/Output from previous day: 12/24 0701 - 12/25 0700 In: 120 [P.O.:120] Out: 1000 [Urine:1000] Intake/Output this shift: Total I/O In: -  Out: 900 [Urine:900] Nutritional status:  Diet Order             Diet regular Room service appropriate? Yes; Fluid consistency: Thin  Diet effective now                  HEENT: [place]/AT. No scleral injection OU. No proptosis. No periorbital swelling or discoloration. No vesicular eruption seen.  Lungs: Respirations unlabored Ext: No edema  Neurologic Exam: Ment: Awake and alert. Speech normal.  CN: PERRL 3 mm >> 2 mm. Visual fields intact all 4 quadrants of each eye. Visual acuity OS: 20/50, OD 20/70. EOMI and conjugate but with eye pain OS with movement. Subtle left ptosis. Scalp in left V1 is tender to light tactile stimulation, causing patient to wince. Smile symmetric. Phonation intact. Tongue protrudes midline.  Motor: 5/5 x 4 but with some alternating giveway on different trials.  Sensory: Intact to FT BUE and BLE Gait: Normal  Lab Results: Results for orders placed or performed during the hospital encounter of 01/24/22 (from the past 48 hour(s))  C-reactive protein     Status: Abnormal   Collection Time: 01/26/22  4:58 AM  Result Value Ref Range   CRP 1.0 (H) <1.0 mg/dL    Comment: Performed at [facility], [address], [place], [place] [address]  Troponin I (High Sensitivity)     Status: None   Collection Time: 01/26/22  4:58 AM  Result Value Ref Range   Troponin I (High Sensitivity) 6 <18 ng/L    Comment: (NOTE) Elevated high sensitivity troponin I (hsTnI) values and significant  changes across serial measurements may suggest ACS but many other  chronic and acute conditions are known to elevate hsTnI results.  Refer to the "Links" section for chest pain algorithms and additional  guidance. Performed at [facility], [address] [place], [place] [address]   Phosphorus     Status: None   Collection Time: 01/26/22  5:03 AM  Result Value Ref Range   Phosphorus 3.2 2.5 - 4.6 mg/dL    Comment: Performed at [facility], [address] [place], [place] [address]  Magnesium     Status: None   Collection Time: 01/26/22  5:03 AM  Result Value Ref Range   Magnesium 2.1 1.7 - 2.4 mg/dL    Comment: Performed at [facility], [address] [place], [place] [address]  CBC     Status: Abnormal   Collection Time: 01/26/22  5:03 AM  Result Value Ref Range   WBC 6.3 4.0 - 10.5 K/uL   RBC 4.77 4.22 -  5.81 MIL/uL   Hemoglobin 12.7 (L) 13.0 - 17.0 g/dL   HCT 40.2 39.0 - 52.0 %   MCV 84.3 80.0 - 100.0 fL   MCH 26.6 26.0 - 34.0 pg   MCHC 31.6 30.0 - 36.0 g/dL   RDW 14.6 11.5 - 15.5 %   Platelets 165 150 - 400 K/uL   nRBC 0.0 0.0 - 0.2 %    Comment: Performed at [facility], [address] [place], [place] [address]  Basic metabolic panel     Status: Abnormal   Collection Time: 01/26/22  5:03 AM  Result Value Ref Range   Sodium 138 135 - 145 mmol/L   Potassium 3.7 3.5 - 5.1 mmol/L   Chloride 108 98 - 111 mmol/L   CO2 24 22 - 32 mmol/L   Glucose, Bld 125 (H) 70 - 99 mg/dL    Comment: Glucose reference range applies only to samples taken after fasting for at least 8 hours.   BUN 11 8 - 23 mg/dL   Creatinine, Ser 0.85 0.61 - 1.24 mg/dL    Calcium 8.4 (L) 8.9 - 10.3 mg/dL   GFR, Estimated >60 >60 mL/min    Comment: (NOTE) Calculated using the CKD-EPI Creatinine Equation (2021)    Anion gap 6 5 - 15    Comment: Performed at [facility], [address] [place], [place] [address]  Troponin I (High Sensitivity)     Status: None   Collection Time: 01/26/22 10:32 AM  Result Value Ref Range   Troponin I (High Sensitivity) 7 <18 ng/L    Comment: (NOTE) Elevated high sensitivity troponin I (hsTnI) values and significant  changes across serial measurements may suggest ACS but many other  chronic and acute conditions are known to elevate hsTnI results.  Refer to the "Links" section for chest pain algorithms and additional  guidance. Performed at [facility], [address] [place], [place] [address]   Sedimentation rate     Status: None   Collection Time: 01/26/22 10:32 AM  Result Value Ref Range   Sed Rate 5 0 - 20 mm/hr    Comment: Performed at [facility], [address] [place], [place] [address]  Brain natriuretic peptide     Status: None   Collection Time: 01/26/22 10:32 AM  Result Value Ref Range   B Natriuretic Peptide 26.1 0.0 - 100.0 pg/mL    Comment: Performed at [facility], [address] [place], [place] [address]  Protime-INR     Status: Abnormal   Collection Time: 01/26/22 11:40 AM  Result Value Ref Range   Prothrombin Time 19.7 (H) 11.4 - 15.2 seconds   INR 1.7 (H) 0.8 - 1.2    Comment: (NOTE) INR goal varies based on device and disease states. Performed at [facility], [address] [place], [place] [address]   C-reactive protein     Status: None   Collection Time: 01/27/22  5:46 AM  Result Value Ref Range   CRP 0.9 <1.0 mg/dL    Comment: Performed at [facility], [place] [address], [place], [place] [address]  Sedimentation rate     Status: None   Collection Time: 01/27/22  5:46 AM  Result Value Ref Range   Sed Rate 4 0 - 20 mm/hr     Comment: Performed at [facility], [address] [place], [place] [address]  Protime-INR     Status: Abnormal   Collection Time: 01/27/22  5:49  AM  Result Value Ref Range   Prothrombin Time 21.3 (H) 11.4 - 15.2 seconds   INR 1.9 (H) 0.8 - 1.2    Comment: (NOTE) INR goal varies based on device and disease states. Performed at [facility], [address] [place], [place] [address]   Creatinine, serum     Status: None   Collection Time: 01/27/22  5:49 AM  Result Value Ref Range   Creatinine, Ser 0.98 0.61 - 1.24 mg/dL   GFR, Estimated >60 >60 mL/min    Comment: (NOTE) Calculated using the CKD-EPI Creatinine Equation (2021) Performed at [facility], [address], [place], [place] [address]     Recent Results (from the past 240 hour(s))  Resp panel by RT-PCR (RSV, Flu A&B, Covid) Anterior Nasal Swab     Status: None   Collection Time: 01/24/22  6:27 PM   Specimen: Anterior Nasal Swab  Result Value Ref Range Status   SARS Coronavirus 2 by RT PCR NEGATIVE NEGATIVE Final    Comment: (NOTE) SARS-CoV-2 target nucleic acids are NOT DETECTED.  The SARS-CoV-2 RNA is generally detectable in upper respiratory specimens during the acute phase of infection. The lowest concentration of SARS-CoV-2 viral copies this assay can detect is 138 copies/mL. A negative result does not preclude SARS-Cov-2 infection and should not be used as the sole basis for treatment or other patient management decisions. A negative result may occur with  improper specimen collection/handling, submission of specimen other than nasopharyngeal swab, presence of viral mutation(s) within the areas targeted by this assay, and inadequate number of viral copies(<138 copies/mL). A negative result must be combined with clinical observations, patient history, and epidemiological information. The expected result is Negative.  Fact Sheet for Patients:   EntrepreneurPulse.com.au  Fact Sheet for Healthcare Providers:  IncredibleEmployment.be  This test is no t yet approved or cleared by the [place] FDA and  has been authorized for detection and/or diagnosis of SARS-CoV-2 by FDA under an Emergency Use Authorization (EUA). This EUA will remain  in effect (meaning this test can be used) for the duration of the COVID-19 declaration under Section 564(b)(1) of the Act, 21 U.S.C.section 360bbb-3(b)(1), unless the authorization is terminated  or revoked sooner.       Influenza A by PCR NEGATIVE NEGATIVE Final   Influenza B by PCR NEGATIVE NEGATIVE Final    Comment: (NOTE) The Xpert Xpress SARS-CoV-2/FLU/RSV plus assay is intended as an aid in the diagnosis of influenza from Nasopharyngeal swab specimens and should not be used as a sole basis for treatment. Nasal washings and aspirates are unacceptable for Xpert Xpress SARS-CoV-2/FLU/RSV testing.  Fact Sheet for Patients: EntrepreneurPulse.com.au  Fact Sheet for Healthcare Providers: IncredibleEmployment.be  This test is not yet approved or cleared by the [place] FDA and has been authorized for detection and/or diagnosis of SARS-CoV-2 by FDA under an Emergency Use Authorization (EUA). This EUA will remain in effect (meaning this test can be used) for the duration of the COVID-19 declaration under Section 564(b)(1) of the Act, 21 U.S.C. section 360bbb-3(b)(1), unless the authorization is terminated or revoked.     Resp Syncytial Virus by PCR NEGATIVE NEGATIVE Final    Comment: (NOTE) Fact Sheet for Patients: EntrepreneurPulse.com.au  Fact Sheet for Healthcare Providers: IncredibleEmployment.be  This test is not yet approved or cleared by the [place] FDA and has been authorized for detection and/or diagnosis of SARS-CoV-2 by FDA under an Emergency Use  Authorization (EUA). This EUA will remain in effect (  meaning this test can be used) for the duration of the COVID-19 declaration under Section 564(b)(1) of the Act, 21 U.S.C. section 360bbb-3(b)(1), unless the authorization is terminated or revoked.  Performed at [facility], [address] [place], [place] [address]     Lipid Panel No results for input(s): "CHOL", "TRIG", "HDL", "CHOLHDL", "VLDL", "LDLCALC" in the last 72 hours.  Studies/Results: DG Chest Port 1 View  Result Date: 01/26/2022 CLINICAL DATA:  Chest pain EXAM: PORTABLE CHEST 1 VIEW COMPARISON:  01/24/2022 FINDINGS: Stable asymmetric elevation right hemidiaphragm. The lungs are clear without focal pneumonia, edema, pneumothorax or pleural effusion. Cardiopericardial silhouette is at upper limits of normal for size. Right-sided permanent pacemaker/AICD noted. The visualized bony structures of the thorax are unremarkable. IMPRESSION: Stable exam. No acute cardiopulmonary findings. Electronically Signed   By: Misty Stanley M.D.   On: 01/26/2022 12:15   CT HEAD WO CONTRAST (5MM)  Result Date: 01/25/2022 CLINICAL DATA:  Headache, dizziness, blurred vision left eye EXAM: CT HEAD WITHOUT CONTRAST TECHNIQUE: Contiguous axial images were obtained from the base of the skull through the vertex without intravenous contrast. RADIATION DOSE REDUCTION: This exam was performed according to the departmental dose-optimization program which includes automated exposure control, adjustment of the mA and/or kV according to patient size and/or use of iterative reconstruction technique. COMPARISON:  01/24/2022 FINDINGS: Brain: Stable scattered hypodensities throughout the periventricular and subcortical white matter consistent with chronic small vessel ischemic change. No evidence of acute infarct or hemorrhage. Lateral ventricles and midline structures are stable. No acute extra-axial fluid collections. No mass effect. Vascular: Stable  atherosclerosis.  No hyperdense vessel. Skull: Normal. Negative for fracture or focal lesion. Sinuses/Orbits: No acute finding. Other: None. IMPRESSION: 1. Stable head CT, no acute intracranial process. Electronically Signed   By: Randa Ngo M.D.   On: 01/25/2022 17:49    Medications: Scheduled:  amLODipine  5 mg Oral BID   artificial tears   Both Eyes Q1H   ascorbic acid  250 mg Oral Daily   atorvastatin  80 mg Oral Daily   bacitracin   Topical BID   cholecalciferol  1,000 Units Oral Daily   clopidogrel  75 mg Oral Daily   cyanocobalamin  1,000 mcg Oral Daily   dexamethasone (DECADRON) injection  8 mg Intravenous Q24H   finasteride  5 mg Oral Daily   gabapentin  800 mg Oral TID   lidocaine  1 patch Transdermal Q24H   lisinopril  20 mg Oral Daily   magnesium oxide  400 mg Oral Daily   metoprolol succinate  25 mg Oral Daily   pantoprazole  40 mg Oral BID   potassium chloride SA  20 mEq Oral Daily   sodium chloride flush  3 mL Intravenous Q12H   warfarin  7.5 mg Oral ONCE-1600   Warfarin - Pharmacist Dosing Inpatient   Does not apply q1600   zolpidem  10 mg Oral QHS   Continuous:  acyclovir 760 mg (01/27/22 0923)   Assessment: This is a 66 year old gentleman with past medical history significant for DM2, CAD s/p multiple stents on plavix, hx TIA or CVA, a fib and factor V Leiden mutation, hypertension, pacemaker placements and revisions most recently 2 years ago at [facility] and sarcoidosis who presented to the hospital on 12/22 with a 3 day history of pain and tenderness involving his left periorbital and scalp region, left retroorbital eye pain exacerbated by movement, and blurred vision of his left eye.  - DDx: - Primary concern  is that he could have herpes zoster in the V1 distribution with L eye involvement (herpes zoster ophthalmicus).  - All symptoms are in the V1 distribution including the pain involving the temporal/parietal scalp as well as the top of his head, respecting the  midline. No Hutchinson's sign seen on exam today (Hutchinson's sign is defined as skin lesions at the tip, side, or root of the nose and is a strong predictor of ocular inflammation and corneal denervation in herpes zoster ophthalmicus).  - Many cases of herpes zoster ophthalmicus exhibit a prodromal period of fever, malaise, headache, and eye pain prior to the eruption of the skin rash. Patients may describe eye pressure, tearing, eye redness, or decreasing vision. Pain in the distribution of the trigeminal nerve may be severe. - If herpes zoster is the etiology, then he may develop associated vesicular lesions over the next few days.  - We are concerned enough about this and the associated danger of permanent vision loss that we feel switching from po valtrex to IV acyclovir is warranted.  - Optic neuritis could cause the blurry vision and pain on eye movement but would not explain the constant burning in the V1 distribution.  - MS can cause trigeminal neuralgia but his sx are not paroxysmal or otherwise consistent with that. 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 The patient also complained of ataxia on admission, which has since resolved. Although the ocular symptoms with headache are atypical for stroke, possible newly manifest neurosarcoid with flare could cause symptoms/signs localizable to separate locations along the neuraxis. - Patient has normal ESR and CRP making GCA less likely.  - MRI brain and orbits with and without contrast are warranted but it is unclear if his device is MRI-compatible. He has an AICD initially placed in 2010 s/p generator replacement at [facility] in 2021 with 4 lead revisions, per Dr. Artemio Aly note, but patient states this morning that his most recent pacemaker placement was 2 years ago at [facility], with Dr. Carilyn Goodpasture as the electrophysiologist who placed the pacemaker. He also states this morning that although the pacemaker  is 64 years old, the leads are several years older, from a prior pacemaker placement. We cannot definitively say from the records as well as patient's changing recollection of his pacemaker placement history whether his device and leads are compatible. Best option would be to contact Dr. Carilyn Goodpasture at [facility] on Tuesday for his opinion on MRI compatibility.  - Hospitalist briefly discussed patient with ophtho on admission but full consultation would be beneficial.  - Bedside evaluation today: - Symptoms: This morning, the patient states that his left "eye squeeching" (aka squinting) is improved nearly to baseline. His blurred vision is improved. The eye pain with movement is not improved. The scalp tenderness and pain in the left V1 distribution is improved.  - Signs: Pupils equal. Visual acuity in left eye is unexpectedly better than that in right eye, despite visual blurring complaint being on the left. No scleral injection. No vesicular eruptions. Scalp in left V1 is tender to light tactile stimulation, causing patient to wince. Eye pain is triggered by testing of EOM, which are full.   Recommendations: - Formal Ophthalmology consult is recommended - Continue acyclovir IV 10 mg/kg q 8 hrs + dexamethasone - Would contact Dr. Carilyn Goodpasture at [facility] on Tuesday (Cardiologist who, per patient, performed his most recent pacemaker placement 2 years ago) for his opinion on MRI compatibility.  If patient is determined to have an MRI-compatible AICD then would transfer to [facility] for MRI brain and orbits with and without contrast   LOS: 0 days   _0  signed: Dr. Kerney Elbe 01/27/2022  12:35 PM

## 2022-01-28 ENCOUNTER — Other Ambulatory Visit: Payer: Self-pay

## 2022-01-28 DIAGNOSIS — D869 Sarcoidosis, unspecified: Secondary | ICD-10-CM | POA: Insufficient documentation

## 2022-01-28 DIAGNOSIS — D682 Hereditary deficiency of other clotting factors: Secondary | ICD-10-CM | POA: Diagnosis present

## 2022-01-28 DIAGNOSIS — R079 Chest pain, unspecified: Secondary | ICD-10-CM

## 2022-01-28 DIAGNOSIS — R270 Ataxia, unspecified: Secondary | ICD-10-CM | POA: Diagnosis not present

## 2022-01-28 DIAGNOSIS — I4819 Other persistent atrial fibrillation: Secondary | ICD-10-CM

## 2022-01-28 LAB — CMV IGM: CMV IgM: <30 AU/mL (ref 0.0–29.9)

## 2022-01-28 LAB — TROPONIN I (HIGH SENSITIVITY): Troponin I (High Sensitivity): 17 ng/L (ref <18)

## 2022-01-28 LAB — PROTIME-INR
INR: 2.9 — ABNORMAL HIGH (ref 0.8–1.2)
Prothrombin Time: 30.4 seconds — ABNORMAL HIGH (ref 11.4–15.2)

## 2022-01-28 MED ORDER — POLYVINYL ALCOHOL 1.4 % OP SOLN: 1.0000 [drp] | OPHTHALMIC | Status: DC | PRN

## 2022-01-28 MED ORDER — DILTIAZEM HCL 30 MG PO TABS: 30.0000 mg | ORAL_TABLET | Freq: Four times a day (QID) | ORAL | Status: DC | PRN

## 2022-01-28 MED ORDER — METOPROLOL SUCCINATE ER 50 MG PO TB24
50.0000 mg | ORAL_TABLET | Freq: Every day | ORAL | Status: DC
  Administered 2022-01-29 – 2022-01-30 (×2): 50 mg via ORAL
  Filled 2022-01-28 (×2): qty 1

## 2022-01-28 MED ORDER — MIDODRINE HCL 5 MG PO TABS
5.0000 mg | ORAL_TABLET | Freq: Three times a day (TID) | ORAL | Status: DC
  Administered 2022-01-28: 5 mg via ORAL
  Filled 2022-01-28: qty 1

## 2022-01-28 MED ORDER — SODIUM CHLORIDE 0.9 % IV SOLN
INTRAVENOUS | Status: DC
  Administered 2022-01-30: 1000 mL via INTRAVENOUS

## 2022-01-28 MED ORDER — WARFARIN SODIUM 4 MG PO TABS
4.0000 mg | ORAL_TABLET | Freq: Once | ORAL | Status: AC
  Administered 2022-01-28: 4 mg via ORAL
  Filled 2022-01-28: qty 1

## 2022-01-28 MED ORDER — MORPHINE SULFATE (PF) 2 MG/ML IV SOLN
2.0000 mg | INTRAVENOUS | Status: DC | PRN
  Administered 2022-01-28: 2 mg via INTRAVENOUS
  Filled 2022-01-28: qty 1

## 2022-01-28 MED ORDER — METOPROLOL TARTRATE 25 MG PO TABS
12.5000 mg | ORAL_TABLET | Freq: Once | ORAL | Status: AC
  Administered 2022-01-28: 12.5 mg via ORAL
  Filled 2022-01-28: qty 1

## 2022-01-28 MED ORDER — MORPHINE SULFATE (PF) 4 MG/ML IV SOLN
4.0000 mg | INTRAVENOUS | Status: DC | PRN
  Administered 2022-01-28 – 2022-01-31 (×17): 4 mg via INTRAVENOUS
  Filled 2022-01-28 (×17): qty 1

## 2022-01-28 NOTE — Assessment & Plan Note (Addendum)
Trop negative, likely related to A. Fib with RVR Morphine for pain Cards consult pending Has been intolerant of diltiazem in the past.

## 2022-01-28 NOTE — Assessment & Plan Note (Signed)
Continue amlodipine, lisinopril, Toprol

## 2022-01-28 NOTE — Progress Notes (Signed)
   01/28/22 1039  Assess: MEWS Score  Temp 98 F (36.7 C)  BP 98/87  MAP (mmHg) 92  Pulse Rate (!) 136  Resp 17  Level of Consciousness Alert  SpO2 95 %  O2 Device Room Air  Assess: MEWS Score  MEWS Temp 0  MEWS Systolic 1  MEWS Pulse 3  MEWS RR 0  MEWS LOC 0  MEWS Score 4  MEWS Score Color Red  Assess: if the MEWS score is Yellow or Red  Were vital signs taken at a resting state? Yes  Focused Assessment Change from prior assessment (see assessment flowsheet)  Does the patient meet 2 or more of the SIRS criteria? No  Does the patient have a confirmed or suspected source of infection? No  MEWS guidelines implemented *See Row Information* Yes  Treat  Pain Scale 0-10  Pain Score 9  Pain Type Acute pain  Pain Location Chest  Pain Descriptors / Indicators Aching  Pain Intervention(s) Medication (See eMAR)  Take Vital Signs  Increase Vital Sign Frequency  Red: Q 1hr X 4 then Q 4hr X 4, if remains red, continue Q 4hrs  Notify: Charge Nurse/RN  Name of Charge Nurse/RN Notified Verl Bangs  Date Charge Nurse/RN Notified 01/28/22  Time Charge Nurse/RN Notified 1044  Provider Notification  Provider Name/Title Tinnie Gens  Date Provider Notified 01/28/22  Time Provider Notified 1044  Notification Reason Change in status  Provider response No new orders (MD is checking with cardiology)  Date of Provider Response 01/28/22  Time of Provider Response 1044  Assess: SIRS CRITERIA  SIRS Temperature  0  SIRS Pulse 1  SIRS Respirations  0  SIRS WBC 0  SIRS Score Sum  1

## 2022-01-28 NOTE — Progress Notes (Signed)
   01/28/22 0750  Assess: MEWS Score  Temp 98.2 F (36.8 C)  BP 107/82  MAP (mmHg) 92  Pulse Rate (!) 136  Resp 17  Level of Consciousness Alert  SpO2 97 %  O2 Device Room Air  Assess: MEWS Score  MEWS Temp 0  MEWS Systolic 0  MEWS Pulse 3  MEWS RR 0  MEWS LOC 0  MEWS Score 3  MEWS Score Color Yellow  Assess: if the MEWS score is Yellow or Red  Were vital signs taken at a resting state? Yes  Focused Assessment Change from prior assessment (see assessment flowsheet)  Does the patient meet 2 or more of the SIRS criteria? No  MEWS guidelines implemented *See Row Information* Yes  Treat  MEWS Interventions Administered prn meds/treatments  Pain Scale 0-10  Pain Score 9  Pain Type Acute pain  Pain Location Chest  Pain Descriptors / Indicators Aching  Pain Intervention(s) MD notified (Comment)  Take Vital Signs  Increase Vital Sign Frequency  Yellow: Q 2hr X 2 then Q 4hr X 2, if remains yellow, continue Q 4hrs  Escalate  MEWS: Escalate Yellow: discuss with charge nurse/RN and consider discussing with provider and RRT  Notify: Charge Nurse/RN  Name of Charge Nurse/RN Notified Verl Bangs  Date Charge Nurse/RN Notified 01/28/22  Time Charge Nurse/RN Notified 9485  Provider Notification  Provider Name/Title Tinnie Gens  MD  Date Provider Notified 01/28/22  Time Provider Notified [phone]  Method of Notification  (text)  Notification Reason Change in status  Provider response See new orders  Date of Provider Response 01/28/22  Time of Provider Response 0804  Assess: SIRS CRITERIA  SIRS Temperature  0  SIRS Pulse 1  SIRS Respirations  0  SIRS WBC 0  SIRS Score Sum  1

## 2022-01-28 NOTE — Assessment & Plan Note (Signed)
On no meds Continue diet

## 2022-01-28 NOTE — Consult Note (Signed)
CARDIOLOGY CONSULT NOTE               Patient ID: [name] MRN: [id number] DOB/AGE: [birth year] 64 y.o.  Admit date: 01/24/2022 Referring Physician Dr Nolberto Hanlon hospitalist Primary Physician Tomasa Blase Pagan MD Primary Cardiologist  Reason for Consultation shortness of breath blurred vision headache pacemaker defibrillator in place evaluate for compatibility for MRI  HPI: 66 year old male multiple medical problems multivessel coronary disease including stent sarcoid under no direct treatments atrial fibrillation congestive heart failure diabetes permanent pacemaker defibrillator hypertension factor V Leiden heterozygote obesity came to emergency room with complaints of headache blurred vision patient has a pacemaker in place and cardiology was asked to evaluate for possible MRI procedure with particular pacemaker in place.  Patient has an extensive history of cardiac disease as well as multiple allergies to multiple complaints about his general overall care send patient presents for further evaluation and management  Review of systems complete and found to be negative unless listed above     Past Medical History:  Diagnosis Date   Atrial fibrillation (HCC)    CHF (congestive heart failure) (HCC)    Diabetes mellitus without complication (HCC)    Factor 5 Leiden mutation, heterozygous (HCC)    Hypertension    Pacemaker    Sarcoidosis     Past Surgical History:  Procedure Laterality Date   CARDIAC SURGERY     HERNIA REPAIR     TRANSURETHRAL RESECTION OF PROSTATE      Medications Prior to Admission  Medication Sig Dispense Refill Last Dose   albuterol (VENTOLIN HFA) 108 (90 Base) MCG/ACT inhaler Inhale 2 puffs into the lungs every 4 (four) hours as needed.   prn at prn   amLODipine (NORVASC) 2.5 MG tablet Take 2.5 mg by mouth 2 (two) times daily.   01/24/2022 at 0930   ascorbic acid (VITAMIN C) 250 MG tablet Take 250 mg by mouth daily.   01/24/2022 at 0930    atorvastatin (LIPITOR) 80 MG tablet Take 80 mg by mouth daily.   01/24/2022 at 0930   cholecalciferol (VITAMIN D3) 10 MCG (400 UNIT) TABS tablet Take 400 Units by mouth daily.   01/24/2022 at 0930   clopidogrel (PLAVIX) 75 MG tablet Take 75 mg by mouth daily.   01/24/2022 at 0930   cyanocobalamin (VITAMIN B12) 1000 MCG tablet Take 1,000 mcg by mouth daily.   01/24/2022 at 0930   finasteride (PROSCAR) 5 MG tablet Take 5 mg by mouth daily.   01/24/2022 at 0930   gabapentin (NEURONTIN) 800 MG tablet Take 800 mg by mouth 3 (three) times daily. Take 800 mg by mouth in the morning, at noon, and at bedtime.   01/24/2022 at 0930   HYDROcodone-acetaminophen (HYCET) 7.5-325 mg/15 ml solution Take 7.5 mLs by mouth as directed.   prn at prn   ipratropium-albuterol (DUONEB) 0.5-2.5 (3) MG/3ML SOLN Take 3 mLs by nebulization every 6 (six) hours.   prn at prn   lisinopril (ZESTRIL) 2.5 MG tablet Take 2.5 mg by mouth daily.   01/24/2022 at 0930   magnesium oxide (MAG-OX) 400 (240 Mg) MG tablet Take 400 mg by mouth daily.   01/24/2022 at 0930   metoprolol succinate (TOPROL-XL) 100 MG 24 hr tablet Take 100 mg by mouth daily.   01/24/2022 at 0930   nicotine polacrilex (NICORETTE) 4 MG gum Take 4 mg by mouth as needed for smoking cessation.   prn at prn   pantoprazole (PROTONIX) 40 MG tablet Take 40  mg by mouth 2 (two) times daily.   01/24/2022 at 0930   potassium chloride SA (KLOR-CON M20) 20 MEQ tablet Take 20 mEq by mouth daily.   01/24/2022 at 0930   tadalafil (CIALIS) 20 MG tablet Take 20 mg by mouth daily as needed for erectile dysfunction.   prn at prn   tamsulosin (FLOMAX) 0.4 MG CAPS capsule Take 0.4 mg by mouth daily.   01/24/2022 at 0930   warfarin (COUMADIN) 7.5 MG tablet Take 7.5 mg by mouth daily.   01/24/2022 at 0930   zolpidem (AMBIEN) 10 MG tablet Take 10 mg by mouth at bedtime.   01/23/2022 at 2200   clotrimazole (LOTRIMIN) 1 % cream Apply 1 Application topically 2 (two) times daily as needed.  (Patient not taking: Reported on 01/24/2022)   Not Taking   gabapentin (NEURONTIN) 600 MG tablet Take 600 mg by mouth in the morning, at noon, and at bedtime. (Patient not taking: Reported on 01/24/2022)   Not Taking   oxyCODONE-acetaminophen (PERCOCET) 10-325 MG tablet Take 1 tablet by mouth every 6 (six) hours as needed for pain. (Patient not taking: Reported on 01/24/2022)   Not Taking   Social History   Socioeconomic History   Marital status: Single    Spouse name: Not on file   Number of children: Not on file   Years of education: Not on file   Highest education level: Not on file  Occupational History   Not on file  Tobacco Use   Smoking status: Former   Smokeless tobacco: Never  Vaping Use   Vaping Use: Never used  Substance and Sexual Activity   Alcohol use: No   Drug use: No   Sexual activity: Not on file  Other Topics Concern   Not on file  Social History Narrative   Not on file   Social Determinants of Health   Financial Resource Strain: Not on file  Food Insecurity: No Food Insecurity (01/25/2022)   Hunger Vital Sign    Worried About Running Out of Food in the Last Year: Never true    Ran Out of Food in the Last Year: Never true  Transportation Needs: No Transportation Needs (01/25/2022)   PRAPARE - Administrator, Civil Service (Medical): No    Lack of Transportation (Non-Medical): No  Physical Activity: Not on file  Stress: Not on file  Social Connections: Not on file  Intimate Partner Violence: Not At Risk (01/26/2022)   Humiliation, Afraid, Rape, and Kick questionnaire    Fear of Current or Ex-Partner: No    Emotionally Abused: No    Physically Abused: No    Sexually Abused: No    Family History  Problem Relation Age of Onset   Diabetes Mother    CAD Mother    Hypertension Mother    CAD Father    Hyperlipidemia Father    Hypertension Father       Review of systems complete and found to be negative unless listed above       PHYSICAL EXAM  General: Well developed, well nourished, in no acute distress HEENT:  Normocephalic and atramatic Neck:  No JVD.  Lungs: Clear bilaterally to auscultation and percussion. Heart: HRRR . Normal S1 and S2 without gallops or murmurs.  Abdomen: Bowel sounds are positive, abdomen soft and non-tender  Msk:  Back normal, normal gait. Normal strength and tone for age. Extremities: No clubbing, cyanosis or edema.   Neuro: Alert and oriented X 3. Psych:  Good affect, responds appropriately  Labs:   Lab Results  Component Value Date   WBC 6.3 01/26/2022   HGB 12.7 (L) 01/26/2022   HCT 40.2 01/26/2022   MCV 84.3 01/26/2022   PLT 165 01/26/2022    Recent Labs  Lab 01/26/22 0503 01/27/22 0549  NA 138  --   K 3.7  --   CL 108  --   CO2 24  --   BUN 11  --   CREATININE 0.85 0.98  CALCIUM 8.4*  --   GLUCOSE 125*  --    Lab Results  Component Value Date   CKTOTAL 317 01/24/2022   CKMB 2.7 09/09/2012   TROPONINI <0.03 11/03/2016    Lab Results  Component Value Date   CHOL 126 08/01/2021   CHOL 140 09/10/2012   CHOL 127 08/24/2011   Lab Results  Component Value Date   HDL 34 (L) 08/01/2021   HDL 21 (L) 09/10/2012   HDL 20 (L) 08/24/2011   Lab Results  Component Value Date   LDLCALC 65 08/01/2021   LDLCALC 63 09/10/2012   LDLCALC 47 08/24/2011   Lab Results  Component Value Date   TRIG 137 08/01/2021   TRIG 281 (H) 09/10/2012   TRIG 298 (H) 08/24/2011   Lab Results  Component Value Date   CHOLHDL 3.7 08/01/2021   No results found for: "LDLDIRECT"    Radiology: [facility] Chest Port 1 View  Result Date: 01/26/2022 CLINICAL DATA:  Chest pain EXAM: PORTABLE CHEST 1 VIEW COMPARISON:  01/24/2022 FINDINGS: Stable asymmetric elevation right hemidiaphragm. The lungs are clear without focal pneumonia, edema, pneumothorax or pleural effusion. Cardiopericardial silhouette is at upper limits of normal for size. Right-sided permanent pacemaker/AICD noted. The  visualized bony structures of the thorax are unremarkable. IMPRESSION: Stable exam. No acute cardiopulmonary findings. Electronically Signed   By: Kennith Center M.D.   On: 01/26/2022 12:15   CT HEAD WO CONTRAST ( )  Result Date: 01/25/2022 CLINICAL DATA:  Headache, dizziness, blurred vision left eye EXAM: CT HEAD WITHOUT CONTRAST TECHNIQUE: Contiguous axial images were obtained from the base of the skull through the vertex without intravenous contrast. RADIATION DOSE REDUCTION: This exam was performed according to the departmental dose-optimization program which includes automated exposure control, adjustment of the mA and/or kV according to patient size and/or use of iterative reconstruction technique. COMPARISON:  01/24/2022 FINDINGS: Brain: Stable scattered hypodensities throughout the periventricular and subcortical white matter consistent with chronic small vessel ischemic change. No evidence of acute infarct or hemorrhage. Lateral ventricles and midline structures are stable. No acute extra-axial fluid collections. No mass effect. Vascular: Stable atherosclerosis.  No hyperdense vessel. Skull: Normal. Negative for fracture or focal lesion. Sinuses/Orbits: No acute finding. Other: None. IMPRESSION: 1. Stable head CT, no acute intracranial process. Electronically Signed   By: Sharlet Salina M.D.   On: 01/25/2022 17:49   CT ABDOMEN PELVIS WO CONTRAST  Result Date: 01/25/2022 CLINICAL DATA:  Abdominal pain. EXAM: CT ABDOMEN AND PELVIS WITHOUT CONTRAST TECHNIQUE: Multidetector CT imaging of the abdomen and pelvis was performed following the standard protocol without IV contrast. RADIATION DOSE REDUCTION: This exam was performed according to the departmental dose-optimization program which includes automated exposure control, adjustment of the mA and/or kV according to patient size and/or use of iterative reconstruction technique. COMPARISON:  06/25/2012. FINDINGS: Lower chest: Mild stable scarring or  subsegmental atelectasis at the left lung base. Lung bases otherwise clear. Hepatobiliary: Normal liver. Gallbladder decompressed. No bile duct dilation. Pancreas: Unremarkable.  No pancreatic ductal dilatation or surrounding inflammatory changes. Spleen: Normal in size without focal abnormality. Adrenals/Urinary Tract: Normal adrenal glands. Kidneys normal in size, orientation and position. 1.2 cm low-attenuation mass from the right kidney upper pole, consistent with a cyst. No follow-up recommended. No other renal masses, no stones and no hydronephrosis. Normal ureters. Bladder decompressed. Stomach/Bowel: Stomach moderately distended, otherwise unremarkable. Small bowel and colon are normal in caliber. No wall thickening. No inflammation. Normal appendix visualized. Vascular/Lymphatic: Aortic atherosclerosis. No aneurysm. No enlarged lymph nodes. Reproductive: Unremarkable. Other: No abdominal wall hernia or abnormality. No abdominopelvic ascites. Musculoskeletal: No fracture or acute finding.  No bone lesion. IMPRESSION: 1. No acute findings within the abdomen or pelvis. 2. Aortic atherosclerosis. Electronically Signed   By: Amie Portland M.D.   On: 01/25/2022 12:14   CT ANGIO HEAD NECK W WO CM  Result Date: 01/24/2022 CLINICAL DATA:  Initial evaluation for neuro deficit, stroke suspected, ataxia with blurry vision. EXAM: CT ANGIOGRAPHY HEAD AND NECK TECHNIQUE: Multidetector CT imaging of the head and neck was performed using the standard protocol during bolus administration of intravenous contrast. Multiplanar CT image reconstructions and MIPs were obtained to evaluate the vascular anatomy. Carotid stenosis measurements (when applicable) are obtained utilizing NASCET criteria, using the distal internal carotid diameter as the denominator. RADIATION DOSE REDUCTION: This exam was performed according to the departmental dose-optimization program which includes automated exposure control, adjustment of the mA  and/or kV according to patient size and/or use of iterative reconstruction technique. CONTRAST:  49mL OMNIPAQUE IOHEXOL 350 MG/ML SOLN COMPARISON:  CT from earlier the same day. FINDINGS: CTA NECK FINDINGS Aortic arch: Visualized aortic arch normal in caliber with standard branch pattern. Mild atheromatous change about the arch itself. No significant stenosis about the origin the great vessels. Right carotid system: Right common and internal carotid arteries are patent without dissection. Mild atheromatous change about the right carotid bulb without hemodynamically significant greater than 50% stenosis. Left carotid system: Left common and internal carotid arteries are patent without dissection. Mild atheromatous change about the left carotid bulb without hemodynamically significant greater than 50% stenosis. Vertebral arteries: Left vertebral artery arises directly from the aortic arch. Right vertebral artery slightly dominant. Vertebral arteries patent without stenosis or dissection. Skeleton: No discrete or worrisome osseous lesions. Poor dentition noted. Torus mandibularis noted as well. Other neck: No other acute soft tissue abnormality within the neck. Right-sided Upper chest: Right-sided pacemaker/AICD noted. Upper lobe predominant emphysema. No other acute finding. Review of the MIP images confirms the above findings CTA HEAD FINDINGS Anterior circulation: Atheromatous change within the carotid siphons with up to moderate stenoses bilaterally. A1 segments patent bilaterally. Right A1 hypoplastic. Normal anterior communicating complex. Anterior cerebral arteries patent without significant stenosis. No M1 stenosis or occlusion. No proximal MCA branch occlusion or high-grade stenosis. Distal MCA branches well perfused and symmetric, although demonstrates small vessel atheromatous irregularity. Posterior circulation: Both V4 segments patent without stenosis. Both PICA patent. Basilar patent without stenosis.  Superior cerebral arteries patent bilaterally. Both PCAs primarily supplied via the basilar. Atheromatous irregularity within the PCAs without proximal high-grade stenosis. Venous sinuses: Patent allowing for timing the contrast bolus. Anatomic variants: None significant.  No aneurysm. Review of the MIP images confirms the above findings IMPRESSION: 1. Negative CTA for large vessel occlusion or other emergent finding. 2. Atheromatous change about the carotid siphons with up to moderate stenoses bilaterally. 3. Mild atheromatous irregularity elsewhere about the major arterial vasculature of the head and neck. No other hemodynamically  significant or correctable stenosis. Aortic Atherosclerosis (ICD10-I70.0) and Emphysema (ICD10-J43.9). Electronically Signed   By: Rise MuBenjamin  McClintock M.D.   On: 01/24/2022 22:33   CT Head Wo Contrast  Result Date: 01/24/2022 CLINICAL DATA:  Left-sided head pain/eye pain. EXAM: CT HEAD WITHOUT CONTRAST TECHNIQUE: Contiguous axial images were obtained from the base of the skull through the vertex without intravenous contrast. RADIATION DOSE REDUCTION: This exam was performed according to the departmental dose-optimization program which includes automated exposure control, adjustment of the mA and/or kV according to patient size and/or use of iterative reconstruction technique. COMPARISON:  11/03/2016 FINDINGS: Brain: No evidence of acute infarction, hemorrhage, hydrocephalus, extra-axial collection or mass lesion/mass effect. Patchy low-density in the cerebral white matter which is chronic and presumed chronic small vessel ischemia. Vascular: No hyperdense vessel or unexpected calcification. Skull: Normal. Negative for fracture or focal lesion. Sinuses/Orbits: No acute finding. IMPRESSION: No acute finding or explanation for symptoms. Electronically Signed   By: Tiburcio PeaJonathan  Watts M.D.   On: 01/24/2022 17:12   DG Chest 2 View  Result Date: 01/24/2022 CLINICAL DATA:  Shortness of  breath EXAM: CHEST - 2 VIEW COMPARISON:  08/01/2021 FINDINGS: Cardiac size is within normal limits. Possible coronary artery calcifications are seen. There are no signs of pulmonary edema or focal pulmonary consolidation. There is no pleural effusion or pneumothorax. Pacemaker battery is seen in right infraclavicular region with tips of leads in right atrium and right ventricle. IMPRESSION: No active cardiopulmonary disease. Electronically Signed   By: Ernie AvenaPalani  Rathinasamy M.D.   On: 01/24/2022 15:49    EKG: Normal sinus rhythm nonspecific ST-T changes low voltage rate of 70 incomplete right bundle branch block  ASSESSMENT AND PLAN:  Ataxia Headache Visual disturbance left eye Shortness of breath Multivessel coronary artery disease Sick sinus syndrome pacemaker in place Obesity . Plan Recommend to have cardiac evaluation at this stage Echocardiogram for assessment of left ventricular function wall motion may be beneficial Consider outpatient functional study Do not recommend invasive cardiac cath at this stage [facility] be seen with pacemaker in place Strongly recommend patient [facility]   Signed: Alwyn Peawayne D Nixie Laube MD 01/28/2022, 2:48 PM

## 2022-01-28 NOTE — Assessment & Plan Note (Signed)
ON no meds for this

## 2022-01-28 NOTE — Assessment & Plan Note (Signed)
S/p numerous prior ablations, has Complete heart block with pacer

## 2022-01-28 NOTE — Progress Notes (Signed)
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 Neurology was consulted.  He was started on high-dose acyclovir.  He has failed to develop any clinically significant vesicular rash.  He was also mildly ataxic insulin given timing of MRI scheduled.  He has not has implantable defibrillator as well as his pacemaker is unclear if patient can undergo MRI imaging. He then developed chest pain and A-fib with rapid ventricular response.  Etiology did not tolerate diltiazem with in the past and metoprolol was ineffective.  Rate controlled.  Cardiology consultation was obtained given his complicated history  Consultations Neurology Cardiology Ophthalmology  Assessment and Plan: * Ataxia-resolved as of 01/28/2022    Chest pain Trop negative, likely related to A. Fib with RVR Morphine for pain Cards consult pending Has been intolerant of diltiazem in the past.  CAD (coronary artery disease) Continue Lipitor, Plavix, b-blockers, Warfarin  Atrial fibrillation, chronic (HCC) S/p numerous prior ablations, has Complete heart block with pacer  Hypertension Continue amlodipine, lisinopril, Toprol  DM (diabetes mellitus), type 2 (HCC) On no meds Continue diet  Sarcoidosis ON no meds for this   Unsteady gait when walking Check b12, foalte, f/u CTA head.All WNL Mri brain if able--has implantable defibrillator/pacemaker  Shortness of breath Likely related to A. Fib with RVR Cardiology consulted  Subjective visual disturbance, left  eye Neurology concern for HSV1 and VZV On Acyclovir  Left temporal headache Unclear etiology--does not appear that he has HSV1 or VZV outbreak        Subjective: Patient reports substernal chest pain radiating to his left arm.   Physical Exam: Vitals:   01/28/22 1147 01/28/22 1150 01/28/22 1244 01/28/22 1422  BP: 95/78  112/78 104/68  Pulse: (!) 138 89 (!) 134 (!) 124  Resp: 18  17 18   Temp: 98.3 F (36.8 C)  97.6 F (36.4 C) 98.8 F (37.1 C)  TempSrc:   Oral   SpO2: 96% 96% 96% 94%  Weight:      Height:       Physical Examination: General appearance - alert, well appearing, and in no distress Chest - clear to auscultation, no wheezes, rales or rhonchi, symmetric air entry throughout. Heart -irregularly irregular tachy rate Abdomen - soft, nontender, nondistended, no masses or organomegaly Extremities - Elevated peripheral pulses normal, no pedal edema, no clubbing or cyanosis  Data Reviewed: Results for orders placed or performed during the hospital encounter of 01/24/22 (from the past 24 hour(s))  Protime-INR     Status: Abnormal   Collection Time: 01/28/22  7:28 AM  Result Value Ref Range   Prothrombin Time 30.4 (H) 11.4 - 15.2 seconds   INR 2.9 (H) 0.8 - 1.2  Troponin I (High Sensitivity)     Status: None   Collection Time: 01/28/22  9:06 AM  Result Value Ref Range   Troponin I (High Sensitivity) 17 <18 ng/L     Family Communication: Patient at bedside  Disposition: Status is: Inpatient Remains inpatient appropriate because: Continued workup  Planned Discharge Destination: Home DVT prophylaxis: On warfarin with Time spent:  37 minutes  Author: Reva Bores, MD 01/28/2022 4:04 PM  For on call review [email or web address].

## 2022-01-28 NOTE — Assessment & Plan Note (Signed)
Continue Lipitor, Plavix, b-blockers, Warfarin

## 2022-01-28 NOTE — Progress Notes (Addendum)
ANTICOAGULATION CONSULT NOTE   Pharmacy Consult for Warfarin  Indication: atrial fibrillation  Allergies  Allergen Reactions   Celecoxib Other (See Comments)    Other reaction(s): GI Bleed, Other - See Comments GI BLEEDING Other Reaction: GI BLEEDING GI bleeding    Ciprofloxacin Anaphylaxis and Other (See Comments)    Other reaction(s): Other - See Comments Pt went into At Fib Other Reaction: PUT PT IN A FIB    Dicyclomine     Other reaction(s): chest pain   Hydromorphone Nausea And Vomiting    Other reaction(s): Vomiting  Nausea and vomiting   Oxycodone-Acetaminophen Nausea And Vomiting    Other reaction(s): Vomiting   Ranolazine Anaphylaxis    Patient describes abdominal swelling/bloating that radiates to neck/throat Patient describes abdominal swelling/bloating that radiates to neck/throat    Rivaroxaban Rash    Other reaction(s): Other (See Comments), Other - See Comments SEVERE RECTAL BLEED Sever rectal bleeding Sever rectal bleeding SEVERE RECTAL BLEED    Ezetimibe Diarrhea    Other reaction(s): cough, Other - See Comments Cough Cough    Pregabalin Other (See Comments)    "weight gain"  Weight gain   Apixaban    Dabigatran Etexilate Mesilate [Dabigatran]    Dabigatran Etexilate Mesylate Other (See Comments)   Dilaudid [Hydromorphone Hcl]    Fentanyl     Nauseated and makes pt jittery   Nauseated and makes pt jittery   Nitroglycerin    Other     Other reaction(s): Hives, Itching Pt states he breaks out in red bumps with adhesive materials (ex: patches)   Prednisone     Doesn't recall reaction but comfortable trying decadron inpatient while monitored. MD aware.    Tramadol     Patient Measurements: Height: 5\' 6"  (167.6 cm) Weight: 93.6 kg (206 lb 5.6 oz) IBW/kg (Calculated) : 63.8 Heparin Dosing Weight:   Vital Signs: Temp: 98.2 F (36.8 C) (12/26 0750) Temp Source: Oral (12/26 0750) BP: 107/82 (12/26 0750) Pulse Rate: 136 (12/26  0750)  Labs: Recent Labs    01/26/22 0458 01/26/22 0503 01/26/22 1032 01/26/22 1140 01/27/22 0549 01/28/22 0728  HGB  --  12.7*  --   --   --   --   HCT  --  40.2  --   --   --   --   PLT  --  165  --   --   --   --   LABPROT  --   --   --  19.7* 21.3* 30.4*  INR  --   --   --  1.7* 1.9* 2.9*  CREATININE  --  0.85  --   --  0.98  --   TROPONINIHS 6  --  7  --   --   --      Estimated Creatinine Clearance: 81.5 mL/min (by C-G formula based on SCr of 0.98 mg/dL).   Medical History: Past Medical History:  Diagnosis Date   Atrial fibrillation (HCC)    CHF (congestive heart failure) (HCC)    Diabetes mellitus without complication (HCC)    Factor 5 Leiden mutation, heterozygous (HCC)    Hypertension    Pacemaker    Sarcoidosis     Medications:  Medications Prior to Admission  Medication Sig Dispense Refill Last Dose   albuterol (VENTOLIN HFA) 108 (90 Base) MCG/ACT inhaler Inhale 2 puffs into the lungs every 4 (four) hours as needed.   prn at prn   amLODipine (NORVASC) 2.5 MG tablet Take 2.5 mg  by mouth 2 (two) times daily.   01/24/2022 at 0930   ascorbic acid (VITAMIN C) 250 MG tablet Take 250 mg by mouth daily.   01/24/2022 at 0930   atorvastatin (LIPITOR) 80 MG tablet Take 80 mg by mouth daily.   01/24/2022 at 0930   cholecalciferol (VITAMIN D3) 10 MCG (400 UNIT) TABS tablet Take 400 Units by mouth daily.   01/24/2022 at 0930   clopidogrel (PLAVIX) 75 MG tablet Take 75 mg by mouth daily.   01/24/2022 at 0930   cyanocobalamin (VITAMIN B12) 1000 MCG tablet Take 1,000 mcg by mouth daily.   01/24/2022 at 0930   finasteride (PROSCAR) 5 MG tablet Take 5 mg by mouth daily.   01/24/2022 at 0930   gabapentin (NEURONTIN) 800 MG tablet Take 800 mg by mouth 3 (three) times daily. Take 800 mg by mouth in the morning, at noon, and at bedtime.   01/24/2022 at 0930   HYDROcodone-acetaminophen (HYCET) 7.5-325 mg/15 ml solution Take 7.5 mLs by mouth as directed.   prn at prn    ipratropium-albuterol (DUONEB) 0.5-2.5 (3) MG/3ML SOLN Take 3 mLs by nebulization every 6 (six) hours.   prn at prn   lisinopril (ZESTRIL) 2.5 MG tablet Take 2.5 mg by mouth daily.   01/24/2022 at 0930   magnesium oxide (MAG-OX) 400 (240 Mg) MG tablet Take 400 mg by mouth daily.   01/24/2022 at 0930   metoprolol succinate (TOPROL-XL) 100 MG 24 hr tablet Take 100 mg by mouth daily.   01/24/2022 at 0930   nicotine polacrilex (NICORETTE) 4 MG gum Take 4 mg by mouth as needed for smoking cessation.   prn at prn   pantoprazole (PROTONIX) 40 MG tablet Take 40 mg by mouth 2 (two) times daily.   01/24/2022 at 0930   potassium chloride SA (KLOR-CON M20) 20 MEQ tablet Take 20 mEq by mouth daily.   01/24/2022 at 0930   tadalafil (CIALIS) 20 MG tablet Take 20 mg by mouth daily as needed for erectile dysfunction.   prn at prn   tamsulosin (FLOMAX) 0.4 MG CAPS capsule Take 0.4 mg by mouth daily.   01/24/2022 at 0930   warfarin (COUMADIN) 7.5 MG tablet Take 7.5 mg by mouth daily.   01/24/2022 at 0930   zolpidem (AMBIEN) 10 MG tablet Take 10 mg by mouth at bedtime.   01/23/2022 at 2200   clotrimazole (LOTRIMIN) 1 % cream Apply 1 Application topically 2 (two) times daily as needed. (Patient not taking: Reported on 01/24/2022)   Not Taking   gabapentin (NEURONTIN) 600 MG tablet Take 600 mg by mouth in the morning, at noon, and at bedtime. (Patient not taking: Reported on 01/24/2022)   Not Taking   oxyCODONE-acetaminophen (PERCOCET) 10-325 MG tablet Take 1 tablet by mouth every 6 (six) hours as needed for pain. (Patient not taking: Reported on 01/24/2022)   Not Taking    Assessment: Pharmacy consulted to dose warfarin for Afib in this 66 year old male admitted for possible stroke.  Pt was on warfarin 7.5 mg PO daily PTA, last dose on 12/22. Pt checks INR at home and adjusts his home warfarin dose based on level. No major DDIs, except for plavix which can increase risk of bleeding.   12/22: INR = 2.9  12/23: INR =  2.5 7.5 mg  12/24: INR = 1.7 10 mg 12/25: INR = 1.9 7.5 mg 12/26: INR = 2.9 4 mg   Goal of Therapy:  INR 2-3   Plan:  INR is therapeutic, but big jump from 1.9 to 2.9, possibly due to 10 mg dose given on 12/24. Will give warfarin 4 mg x 1 today (~50% decrease from home dose). Predict INR will trend up slightly tomorrow. INR daily. CBC at least every 3 days.    Oswald Hillock, PharmD, BCPS 01/28/2022,8:29 AM

## 2022-01-28 NOTE — Progress Notes (Signed)
Attempting EKG @ 1940. Patient arguing with staff that  he did not need to remove electronics from bed before completing EKG. Stating he needed his (smart) watch and his phone to be charged. Explained that these electronics could possibly interfere with the EKG and not give an accurate result. NP at bedside. EKG completed. In handing the results from EKG to NP, patient grabbed the results & stated he needed to see it first. He then passed it to NP and they discussed what treatments/rx had been given in past. Orders given RX administered. NP stepped out to review chart. Patient continued to complain of chest pain and stated if he did not see a cardiologist he would walk to ED. Made NP aware and NP back in to speak with patient.

## 2022-01-28 NOTE — Progress Notes (Signed)
Patient called requesting to speak with nurse @ [phone] (RN receiving report). Patient called again at [facility] stating he felt like he was having a heart attack. Patient met staff at the door of room stating he needed help now. Encouraged pt to return to bed and attempted to assess. Staff taking vitals. C/o pain in epigastric area that radiated to left arm. Described as stabbing and aching. Notified NP on duty.

## 2022-01-28 NOTE — Hospital Course (Signed)
Patient 3 PMH of CAD s/p multiple stents on Plavix and warfarin: HTN, paroxysmal A-fib, CHF, HLD, H/o CVA, H/O complete heart block with pacemaker in place, factor V Leiden deficiency on Coumadin, sarcoidosis, GI bleeding related to AVMs who presented with left temporal pain concerning for HSV versus varicella outbreak. Neurology was consulted.  He was started on high-dose acyclovir.  He has failed to develop any clinically significant vesicular rash.  He was also mildly ataxic insulin given timing of MRI scheduled.  He has not has implantable defibrillator as well as his pacemaker is unclear if patient can undergo MRI imaging. He then developed chest pain and A-fib with rapid ventricular response.  Etiology did not tolerate diltiazem with in the past and metoprolol was ineffective.  Rate controlled.  Cardiology consultation was obtained given his complicated history  Consultations Neurology Cardiology Ophthalmology

## 2022-01-28 NOTE — Progress Notes (Signed)
Patient requested food and pain medication. Upon RN entering room, patient with cell phone in one hand and tele monitor in the other. Cell phone aimed at tele monitor and phone set to record with red light on and timer running.

## 2022-01-29 ENCOUNTER — Encounter (HOSPITAL_COMMUNITY): Payer: Self-pay

## 2022-01-29 ENCOUNTER — Other Ambulatory Visit: Payer: Self-pay

## 2022-01-29 DIAGNOSIS — R270 Ataxia, unspecified: Secondary | ICD-10-CM | POA: Diagnosis not present

## 2022-01-29 DIAGNOSIS — R079 Chest pain, unspecified: Secondary | ICD-10-CM

## 2022-01-29 LAB — CBC
HCT: 37.6 % — ABNORMAL LOW (ref 39.0–52.0)
Hemoglobin: 11.8 g/dL — ABNORMAL LOW (ref 13.0–17.0)
MCH: 26.5 pg (ref 26.0–34.0)
MCHC: 31.4 g/dL (ref 30.0–36.0)
MCV: 84.5 fL (ref 80.0–100.0)
Platelets: 199 10*3/uL (ref 150–400)
RBC: 4.45 MIL/uL (ref 4.22–5.81)
RDW: 14.6 % (ref 11.5–15.5)
WBC: 15.1 10*3/uL — ABNORMAL HIGH (ref 4.0–10.5)
nRBC: 0 % (ref 0.0–0.2)

## 2022-01-29 LAB — HSV DNA BY PCR (REFERENCE LAB)
HSV 1 DNA: NEGATIVE
HSV 2 DNA: NEGATIVE

## 2022-01-29 LAB — PROTIME-INR
INR: 3.4 — ABNORMAL HIGH (ref 0.8–1.2)
Prothrombin Time: 34.2 seconds — ABNORMAL HIGH (ref 11.4–15.2)

## 2022-01-29 LAB — TROPONIN I (HIGH SENSITIVITY): Troponin I (High Sensitivity): 15 ng/L (ref <18)

## 2022-01-29 MED ORDER — DEXAMETHASONE SODIUM PHOSPHATE 10 MG/ML IJ SOLN: 8.0000 mg | INTRAMUSCULAR | 0 refills | Status: DC

## 2022-01-29 MED ORDER — LISINOPRIL 5 MG PO TABS
2.5000 mg | ORAL_TABLET | Freq: Every day | ORAL | Status: DC
  Administered 2022-01-29 – 2022-01-30 (×2): 2.5 mg via ORAL
  Filled 2022-01-29 (×2): qty 1

## 2022-01-29 MED ORDER — DEXTROSE 5 % IV SOLN: 760.0000 mg | Freq: Three times a day (TID) | INTRAVENOUS | Status: DC

## 2022-01-29 MED ORDER — WARFARIN SODIUM 4 MG PO TABS
4.0000 mg | ORAL_TABLET | Freq: Once | ORAL | Status: AC
  Administered 2022-01-29: 4 mg via ORAL
  Filled 2022-01-29: qty 1

## 2022-01-29 NOTE — Progress Notes (Signed)
ANTICOAGULATION CONSULT NOTE   Pharmacy Consult for Warfarin  Indication: atrial fibrillation  Allergies  Allergen Reactions   Celecoxib Other (See Comments)    Other reaction(s): GI Bleed, Other - See Comments GI BLEEDING Other Reaction: GI BLEEDING GI bleeding    Ciprofloxacin Anaphylaxis and Other (See Comments)    Other reaction(s): Other - See Comments Pt went into At Fib Other Reaction: PUT PT IN A FIB    Dicyclomine     Other reaction(s): chest pain   Hydromorphone Nausea And Vomiting    Other reaction(s): Vomiting  Nausea and vomiting   Oxycodone-Acetaminophen Nausea And Vomiting    Other reaction(s): Vomiting   Ranolazine Anaphylaxis    Patient describes abdominal swelling/bloating that radiates to neck/throat Patient describes abdominal swelling/bloating that radiates to neck/throat    Rivaroxaban Rash    Other reaction(s): Other (See Comments), Other - See Comments SEVERE RECTAL BLEED Sever rectal bleeding Sever rectal bleeding SEVERE RECTAL BLEED    Ezetimibe Diarrhea    Other reaction(s): cough, Other - See Comments Cough Cough    Pregabalin Other (See Comments)    "weight gain"  Weight gain   Apixaban    Dabigatran Etexilate Mesilate [Dabigatran]    Dabigatran Etexilate Mesylate Other (See Comments)   Dilaudid [Hydromorphone Hcl]    Fentanyl     Nauseated and makes pt jittery   Nauseated and makes pt jittery   Nitroglycerin    Other     Other reaction(s): Hives, Itching Pt states he breaks out in red bumps with adhesive materials (ex: patches)   Prednisone     Doesn't recall reaction but comfortable trying decadron inpatient while monitored. MD aware.    Tramadol     Patient Measurements: Height: 5\' 6"  (167.6 cm) Weight: 93.6 kg (206 lb 5.6 oz) IBW/kg (Calculated) : 63.8 Heparin Dosing Weight:   Vital Signs: Temp: 99.1 F (37.3 C) (12/27 1130) BP: 136/70 (12/27 1130) Pulse Rate: 70 (12/27 1130)  Labs: Recent Labs     01/27/22 0549 01/28/22 0728 01/28/22 0906 01/29/22 0549  HGB  --   --   --  11.8*  HCT  --   --   --  37.6*  PLT  --   --   --  199  LABPROT 21.3* 30.4*  --  34.2*  INR 1.9* 2.9*  --  3.4*  CREATININE 0.98  --   --   --   TROPONINIHS  --   --  17 15     Estimated Creatinine Clearance: 81.5 mL/min (by C-G formula based on SCr of 0.98 mg/dL).   Medical History: Past Medical History:  Diagnosis Date   Atrial fibrillation (HCC)    CHF (congestive heart failure) (HCC)    Diabetes mellitus without complication (HCC)    Factor 5 Leiden mutation, heterozygous (HCC)    Hypertension    Pacemaker    Sarcoidosis     Medications:  Medications Prior to Admission  Medication Sig Dispense Refill Last Dose   albuterol (VENTOLIN HFA) 108 (90 Base) MCG/ACT inhaler Inhale 2 puffs into the lungs every 4 (four) hours as needed.   prn at prn   amLODipine (NORVASC) 2.5 MG tablet Take 2.5 mg by mouth 2 (two) times daily.   01/24/2022 at 0930   ascorbic acid (VITAMIN C) 250 MG tablet Take 250 mg by mouth daily.   01/24/2022 at 0930   atorvastatin (LIPITOR) 80 MG tablet Take 80 mg by mouth daily.   01/24/2022 at 0930  cholecalciferol (VITAMIN D3) 10 MCG (400 UNIT) TABS tablet Take 400 Units by mouth daily.   01/24/2022 at 0930   clopidogrel (PLAVIX) 75 MG tablet Take 75 mg by mouth daily.   01/24/2022 at 0930   cyanocobalamin (VITAMIN B12) 1000 MCG tablet Take 1,000 mcg by mouth daily.   01/24/2022 at 0930   finasteride (PROSCAR) 5 MG tablet Take 5 mg by mouth daily.   01/24/2022 at 0930   gabapentin (NEURONTIN) 800 MG tablet Take 800 mg by mouth 3 (three) times daily. Take 800 mg by mouth in the morning, at noon, and at bedtime.   01/24/2022 at 0930   HYDROcodone-acetaminophen (HYCET) 7.5-325 mg/15 ml solution Take 7.5 mLs by mouth as directed.   prn at prn   ipratropium-albuterol (DUONEB) 0.5-2.5 (3) MG/3ML SOLN Take 3 mLs by nebulization every 6 (six) hours.   prn at prn   lisinopril (ZESTRIL)  2.5 MG tablet Take 2.5 mg by mouth daily.   01/24/2022 at 0930   magnesium oxide (MAG-OX) 400 (240 Mg) MG tablet Take 400 mg by mouth daily.   01/24/2022 at 0930   metoprolol succinate (TOPROL-XL) 100 MG 24 hr tablet Take 100 mg by mouth daily.   01/24/2022 at 0930   nicotine polacrilex (NICORETTE) 4 MG gum Take 4 mg by mouth as needed for smoking cessation.   prn at prn   pantoprazole (PROTONIX) 40 MG tablet Take 40 mg by mouth 2 (two) times daily.   01/24/2022 at 0930   potassium chloride SA (KLOR-CON M20) 20 MEQ tablet Take 20 mEq by mouth daily.   01/24/2022 at 0930   tadalafil (CIALIS) 20 MG tablet Take 20 mg by mouth daily as needed for erectile dysfunction.   prn at prn   tamsulosin (FLOMAX) 0.4 MG CAPS capsule Take 0.4 mg by mouth daily.   01/24/2022 at 0930   warfarin (COUMADIN) 7.5 MG tablet Take 7.5 mg by mouth daily.   01/24/2022 at 0930   zolpidem (AMBIEN) 10 MG tablet Take 10 mg by mouth at bedtime.   01/23/2022 at 2200   clotrimazole (LOTRIMIN) 1 % cream Apply 1 Application topically 2 (two) times daily as needed. (Patient not taking: Reported on 01/24/2022)   Not Taking   gabapentin (NEURONTIN) 600 MG tablet Take 600 mg by mouth in the morning, at noon, and at bedtime. (Patient not taking: Reported on 01/24/2022)   Not Taking   oxyCODONE-acetaminophen (PERCOCET) 10-325 MG tablet Take 1 tablet by mouth every 6 (six) hours as needed for pain. (Patient not taking: Reported on 01/24/2022)   Not Taking    Assessment: Pharmacy consulted to dose warfarin for Afib in this 66 year old male admitted for possible stroke.  Pt was on warfarin 7.5 mg PO daily PTA, last dose on 12/22. Pt checks INR at home and adjusts his home warfarin dose based on level. No major DDIs, except for plavix which can increase risk of bleeding.   12/22: INR = 2.9  12/23: INR = 2.5 7.5 mg  12/24: INR = 1.7 10 mg 12/25: INR = 1.9 7.5 mg 12/26: INR = 2.9 4 mg 12/27: INR = 3.4 4 mg   Goal of Therapy:  INR 2-3    Plan:  - INR continues to trend upward, likely d/t the effects of the 10mg  dose received on 12/24. Will reorder Warfarin 4mg  for today. Suspected INR may still be slightly elevated tomorrow, but closer to 3.0. - INR daily, CBC at least every 3 days.  Bettey Costa, PharmD Clinical Pharmacist 01/29/2022 11:49 AM

## 2022-01-29 NOTE — Progress Notes (Deleted)
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  Neurology was consulted, he was empirically started on high-dose acyclovir for concern for early HSV.  Hospital course complicated by A-fib with RVR with rates into the 150s on 12/26.  Cardiology was consulted.  Due to IgA changes, ophthalmology was also called.  Subjective / 24h Interval events: Multiple complaints this morning.  He is quite upset that he is not feeling any better.  He is upset that A-fib with RVR was yesterday and cardiology only saw him today.  He is upset ophthalmology has not seen him yet.  Assesement and Plan: Active Problems:   Chest pain   CAD (coronary artery disease)   Atrial fibrillation, chronic (HCC)   Hypertension   HLD (hyperlipidemia)   Left temporal headache   Subjective visual disturbance, left eye   Shortness of breath   Unsteady gait when walking   Sarcoidosis   Factor V deficiency (HCC)   DM (diabetes mellitus), type 2 (HCC)  Principal problem Left temporal headache, left eye visual disturbances -neurology was consulted, appreciate input.  He was empirically started on acyclovir, continue.  Ophthalmology consulted, appreciate input -Needs an MRI of the brain with and without contrast as well as MRI of the orbits, patient has history MRI card in his car / wallet, discussed with RN about getting security to bring his wallet -Reports persistent symptoms  Active problems A-fib with RVR - had an episode yesterday with rates in the 150s-160s, cardiology consulted, appreciate input.  Continue Coumadin and metoprolol  CAD-chest pain yesterday in the setting of A-fib with RVR.  Troponin  negative.  Essential hypertension-continue amlodipine, lisinopril, Toprol.  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 per patient, B12, folate, CT head unremarkable  Scheduled Meds:  amLODipine  5 mg Oral BID   ascorbic acid  250 mg Oral Daily   atorvastatin  80 mg Oral Daily   bacitracin   Topical BID   cholecalciferol  1,000 Units Oral Daily   clopidogrel  75 mg Oral Daily   cyanocobalamin  1,000 mcg Oral Daily   dexamethasone (DECADRON) injection  8 mg Intravenous Q24H   finasteride  5 mg Oral Daily   gabapentin  800 mg Oral TID   lidocaine  1 patch Transdermal Q24H   lisinopril  20 mg Oral Daily   magnesium oxide  400 mg Oral Daily   metoprolol succinate  50 mg Oral Daily   midodrine  5 mg Oral TID WC   pantoprazole  40 mg Oral BID   potassium chloride SA  20 mEq Oral Daily   sodium chloride flush  3 mL Intravenous Q12H   warfarin  4 mg Oral ONCE-1600   Warfarin - Pharmacist Dosing Inpatient   Does not apply q1600   zolpidem  10 mg Oral QHS   Continuous Infusions:  sodium chloride 50 mL/hr at 01/28/22 1230   acyclovir 760 mg (01/29/22 0425)   PRN Meds:.acetaminophen, albuterol, diltiazem, HYDROcodone-acetaminophen, metoprolol tartrate, morphine injection, nicotine polacrilex, ondansetron (ZOFRAN) IV, polyvinyl alcohol  Current Outpatient Medications  Medication Instructions  albuterol (VENTOLIN HFA) 108 (90 Base) MCG/ACT inhaler 2 puffs, Inhalation, Every 4 hours PRN   amLODipine (NORVASC) 2.5 mg, Oral, 2 times daily   ascorbic acid (VITAMIN C) 250 mg, Oral, Daily   atorvastatin (LIPITOR) 80 mg, Oral, Daily   cholecalciferol (VITAMIN D3) 400 Units, Oral, Daily   clopidogrel (PLAVIX) 75 mg, Oral, Daily   clotrimazole (LOTRIMIN) 1 % cream 1 Application, 2 times daily PRN   cyanocobalamin (VITAMIN B12) 1,000 mcg, Oral, Daily   finasteride (PROSCAR) 5 mg,  Oral, Daily   gabapentin (NEURONTIN) 600 mg, 3 times daily   gabapentin (NEURONTIN) 800 mg, Oral, 3 times daily, Take 800 mg by mouth in the morning, at noon, and at bedtime.   HYDROcodone-acetaminophen (HYCET) 7.5-325 mg/15 ml solution 7.5 mLs, Oral, As directed   ipratropium-albuterol (DUONEB) 0.5-2.5 (3) MG/3ML SOLN 3 mLs, Nebulization, Every 6 hours   lisinopril (ZESTRIL) 2.5 mg, Oral, Daily   magnesium oxide (MAG-OX) 400 mg, Oral, Daily   metoprolol succinate (TOPROL-XL) 100 mg, Oral, Daily   nicotine polacrilex (NICORETTE) 4 mg, Oral, As needed   oxyCODONE-acetaminophen (PERCOCET) 10-325 MG tablet 1 tablet, Every 6 hours PRN   pantoprazole (PROTONIX) 40 mg, Oral, 2 times daily   potassium chloride SA (KLOR-CON M20) 20 MEQ tablet 20 mEq, Oral, Daily   tadalafil (CIALIS) 20 mg, Oral, Daily PRN   tamsulosin (FLOMAX) 0.4 mg, Oral, Daily   warfarin (COUMADIN) 7.5 mg, Oral, Daily   zolpidem (AMBIEN) 10 mg, Oral, Daily at bedtime    Diet Orders (From admission, onward)     Start     Ordered   01/27/22 1234  Diet regular Room service appropriate? Yes; Fluid consistency: Thin  Diet effective now       Comments: Start after stroke swallow screen complete Ok to have pepsie/coke  Question Answer Comment  Room service appropriate? Yes   Fluid consistency: Thin      01/27/22 1233            DVT prophylaxis: SCDs Start: 01/24/22 2226 warfarin (COUMADIN) tablet 4 mg   Lab Results  Component Value Date   PLT 199 01/29/2022      Code Status: Full Code  Family Communication: no family at bedside   Status is: Inpatient  Remains inpatient appropriate because: persistent symptoms  Level of care: Med-Surg  Consultants:  Cardiology  Neurology Ophthalmology   Objective: Vitals:   01/28/22 2043 01/29/22 0136 01/29/22 0658 01/29/22 0757  BP: 126/76 121/68 117/65 (!) 144/68  Pulse: 63 63 (!) 57 70  Resp: 16 16 18 16   Temp: 97.8 F (36.6 C) 98.3 F (36.8 C)  97.8 F (36.6  C)  TempSrc:      SpO2: 98% 97% 97% 99%  Weight:      Height:        Intake/Output Summary (Last 24 hours) at 01/29/2022 0948 Last data filed at 01/28/2022 1600 Gross per 24 hour  Intake 995.6 ml  Output 600 ml  Net 395.6 ml   Wt Readings from Last 3 Encounters:  01/25/22 93.6 kg  08/01/21 88 kg  11/03/16 91.2 kg    Examination:  Constitutional: NAD Eyes: no scleral icterus ENMT: Mucous membranes are moist.  Neck: normal, supple Respiratory: clear to auscultation bilaterally, no wheezing, no crackles. Normal respiratory effort. No accessory muscle use.  Cardiovascular: Regular rate and rhythm, no murmurs / rubs / gallops. No LE edema.  Abdomen: non distended, no tenderness. Bowel sounds positive.  Musculoskeletal: no clubbing / cyanosis.  Skin: no rashes  Data Reviewed: I have independently reviewed following labs and imaging studies   CBC Recent Labs  Lab 01/24/22 1459 01/25/22 0502 01/26/22 0503 01/29/22 0549  WBC 5.7 5.6 6.3 15.1*  HGB 12.6* 11.9* 12.7* 11.8*  HCT 41.0 38.5* 40.2 37.6*  PLT 173 158 165 199  MCV 84.7 85.0 84.3 84.5  MCH 26.0 26.3 26.6 26.5  MCHC 30.7 30.9 31.6 31.4  RDW 14.8 14.8 14.6 14.6  LYMPHSABS 1.2  --   --   --   MONOABS 0.6  --   --   --   EOSABS 0.1  --   --   --   BASOSABS 0.1  --   --   --     Recent Labs  Lab 01/24/22 1459 01/25/22 0502 01/26/22 0458 01/26/22 0503 01/26/22 1032 01/26/22 1140 01/27/22 0546 01/27/22 0549 01/28/22 0728 01/29/22 0549  NA 136  --   --  138  --   --   --   --   --   --   K 3.9  --   --  3.7  --   --   --   --   --   --   CL 107  --   --  108  --   --   --   --   --   --   CO2 24  --   --  24  --   --   --   --   --   --   GLUCOSE 147*  --   --  125*  --   --   --   --   --   --   BUN 9  --   --  11  --   --   --   --   --   --   CREATININE 1.11  --   --  0.85  --   --   --  0.98  --   --   CALCIUM 8.9  --   --  8.4*  --   --   --   --   --   --   MG  --   --   --  2.1  --   --   --    --   --   --   CRP 1.1*  --  1.0*  --   --   --  0.9  --   --   --   INR 2.9* 2.5*  --   --   --  1.7*  --  1.9* 2.9* 3.4*  BNP 69.3  --   --   --  26.1  --   --   --   --   --     ------------------------------------------------------------------------------------------------------------------ No results for input(s): "CHOL", "HDL", "LDLCALC", "TRIG", "CHOLHDL", "LDLDIRECT" in the last 72 hours.  Lab Results  Component Value Date   HGBA1C 5.8 (H) 08/01/2021   ------------------------------------------------------------------------------------------------------------------ No results for input(s): "TSH", "T4TOTAL", "T3FREE", "THYROIDAB" in the last 72 hours.  Invalid input(s): "FREET3"  Cardiac Enzymes No results for input(s): "CKMB", "TROPONINI", "MYOGLOBIN" in the last 168 hours.  Invalid input(s): "CK" ------------------------------------------------------------------------------------------------------------------    Component Value Date/Time   BNP 26.1 01/26/2022 1032    CBG: Recent Labs  Lab 01/24/22 2219 01/25/22 1124  GLUCAP 86 119*    Recent Results (from the past 240 hour(s))  Resp panel by RT-PCR (RSV, Flu A&B, Covid) Anterior  Nasal Swab     Status: None   Collection Time: 01/24/22  6:27 PM   Specimen: Anterior Nasal Swab  Result Value Ref Range Status   SARS Coronavirus 2 by RT PCR NEGATIVE NEGATIVE Final    Comment: (NOTE) SARS-CoV-2 target nucleic acids are NOT DETECTED.  The SARS-CoV-2 RNA is generally detectable in upper respiratory specimens during the acute phase of infection. The lowest concentration of SARS-CoV-2 viral copies this assay can detect is 138 copies/mL. A negative result does not preclude SARS-Cov-2 infection and should not be used as the sole basis for treatment or other patient management decisions. A negative result may occur with  improper specimen collection/handling, submission of specimen other than nasopharyngeal swab,  presence of viral mutation(s) within the areas targeted by this assay, and inadequate number of viral copies(<138 copies/mL). A negative result must be combined with clinical observations, patient history, and epidemiological information. The expected result is Negative.  Fact Sheet for Patients:  BloggerCourse.com  Fact Sheet for Healthcare Providers:  SeriousBroker.it  This test is no t yet approved or cleared by the [place] FDA and  has been authorized for detection and/or diagnosis of SARS-CoV-2 by FDA under an Emergency Use Authorization (EUA). This EUA will remain  in effect (meaning this test can be used) for the duration of the COVID-19 declaration under Section 564(b)(1) of the Act, 21 U.S.C.section 360bbb-3(b)(1), unless the authorization is terminated  or revoked sooner.       Influenza A by PCR NEGATIVE NEGATIVE Final   Influenza B by PCR NEGATIVE NEGATIVE Final    Comment: (NOTE) The Xpert Xpress SARS-CoV-2/FLU/RSV plus assay is intended as an aid in the diagnosis of influenza from Nasopharyngeal swab specimens and should not be used as a sole basis for treatment. Nasal washings and aspirates are unacceptable for Xpert Xpress SARS-CoV-2/FLU/RSV testing.  Fact Sheet for Patients: BloggerCourse.com  Fact Sheet for Healthcare Providers: SeriousBroker.it  This test is not yet approved or cleared by the [place] FDA and has been authorized for detection and/or diagnosis of SARS-CoV-2 by FDA under an Emergency Use Authorization (EUA). This EUA will remain in effect (meaning this test can be used) for the duration of the COVID-19 declaration under Section 564(b)(1) of the Act, 21 U.S.C. section 360bbb-3(b)(1), unless the authorization is terminated or revoked.     Resp Syncytial Virus by PCR NEGATIVE NEGATIVE Final    Comment: (NOTE) Fact Sheet for  Patients: BloggerCourse.com  Fact Sheet for Healthcare Providers: SeriousBroker.it  This test is not yet approved or cleared by the [place] FDA and has been authorized for detection and/or diagnosis of SARS-CoV-2 by FDA under an Emergency Use Authorization (EUA). This EUA will remain in effect (meaning this test can be used) for the duration of the COVID-19 declaration under Section 564(b)(1) of the Act, 21 U.S.C. section 360bbb-3(b)(1), unless the authorization is terminated or revoked.  Performed at [facility], [address] [place], [place] [address]      Radiology Studies: No results found.   Pamella Pert, MD, PhD [facility]  Between 7 am - 7 pm I am available, please contact me via Amion (for emergencies) or Securechat (non urgent messages)  Between 7 pm - 7 am I am not available, please contact night coverage MD/APP via Amion

## 2022-01-29 NOTE — TOC Progression Note (Signed)
Transition of Care [facility]) - Progression Note    Patient Details  Name: [name] MRN: [id number] Date of Birth: Apr 09, 1955  Transition of Care [facility]) CM/SW Contact  Tempie Hoist, [place] Phone Number: 01/29/2022, 1:57 PM  Clinical Narrative:     As per the provider, this patient will transfer to [facility].       Expected Discharge Plan and Services    Acute transfer     Expected Discharge Date: 01/29/22                                     Social Determinants of Health (SDOH) Interventions SDOH Screenings   Food Insecurity: No Food Insecurity (01/25/2022)  Housing: Medium Risk (01/25/2022)  Transportation Needs: No Transportation Needs (01/25/2022)  Utilities: Not At Risk (01/25/2022)  Tobacco Use: Medium Risk (01/25/2022)    Readmission Risk Interventions     No data to display

## 2022-01-29 NOTE — Discharge Summary (Signed)
Physician Discharge Summary  [name] [id number] DOB: 09/17/1955 DOA: 01/24/2022  PCP: Shawn Ahr, MD  Admit date: 01/24/2022 Discharge date: 01/29/2022  Admitted From: home Disposition:  transfer to [place]: none Equipment/Devices: none  Discharge Condition: stable CODE STATUS: Full code Diet Orders (From admission, onward)     Start     Ordered   01/27/22 1234  Diet regular Room service appropriate? Yes; Fluid consistency: Thin  Diet effective now       Comments: Start after stroke swallow screen complete Ok to have pepsie/coke  Question Answer Comment  Room service appropriate? Yes   Fluid consistency: Thin      01/27/22 1233            HPI: Per admitting MD, [name] is a 66 y.o. male with medical history significant of CAD and stenting and sarcoidosis (per patient, never treated) presents to [facility] with complaints of 4 days of left temporal pain as well as red ness of left eye as ewll as bluring of vision in left eye. Noprecipitating/relieving/agravating factors. No documented fever, but complain of fever. Patient is compalinting of whole body aches. Complain of dry cough, sensation of sob with exertion. Patient has chornic chest pain with exertion. Reports all extremity cramps/pain. No trauma. No vomiting. No abd pain, no diarrhea. Came to ER today due to non resolving symtpopms. Also reports unsteady gait X 4 days. No fall , no presyncope. No tremor.. no jaw claudication   Hospital Course / Discharge diagnoses: Active Problems:   Chest pain   CAD (coronary artery disease)   Atrial fibrillation, chronic (HCC)   Hypertension   HLD (hyperlipidemia)   Left temporal headache   Subjective visual disturbance, left eye   Shortness of breath   Unsteady gait when walking   Sarcoidosis   Factor V deficiency ([place])   DM (diabetes mellitus), type 2 ([place])   Principal problem Left temporal headache, left eye  visual disturbances -neurology was consulted, appreciate input.  He was empirically started on acyclovir and steroids, continue.  Ophthalmology consulted initially recommended HSV treatment, cool compresses, ointment nightly.  Neurology recommends an MRI of the brain with and without contrast as well as orbits.  There was initial discussions on whether he is ICD/pacemaker is compatible, but turns out that it is, according to the MRI department, " [employer]) ICD/Pacemaker; Model: GALLANT [id number], which is MRI conditional and able to be scanned on the 1.5T and 3.0T magnet once the trained nurse has calibrated/progammed it for MRI mode".  Given these findings, go ahead and transferred to [facility] for MRI as it cannot happen at [facility]   Active problems A-fib with RVR - had an episode yesterday with rates in the 150s-160s, cardiology consulted, appreciate input.  Continue Coumadin and metoprolol.  Now converted back to sinus rhythm  CAD-chest pain yesterday in the setting of A-fib with RVR.  Troponin negative. Essential hypertension-continue amlodipine, lisinopril, Toprol.  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 Prior small bowel AVMs -patient reports some intermittent melena, but hemoglobin is stable now.  Given pending transfer hold off on consulting gastroenterology at [facility], may need consultation at [facility] if hemoglobin dropping  Sepsis ruled out   Discharge Instructions   Allergies as of 01/29/2022  Reactions   Celecoxib Other (See Comments)   Other reaction(s): GI Bleed, Other - See Comments GI BLEEDING Other Reaction: GI BLEEDING GI bleeding   Ciprofloxacin Anaphylaxis, Other (See Comments)   Other reaction(s): Other - See Comments Pt went into At Fib Other Reaction: PUT PT IN  A FIB   Dicyclomine    Other reaction(s): chest pain   Hydromorphone Nausea And Vomiting   Other reaction(s): Vomiting Nausea and vomiting   Oxycodone-acetaminophen Nausea And Vomiting   Other reaction(s): Vomiting   Ranolazine Anaphylaxis   Patient describes abdominal swelling/bloating that radiates to neck/throat Patient describes abdominal swelling/bloating that radiates to neck/throat   Rivaroxaban Rash   Other reaction(s): Other (See Comments), Other - See Comments SEVERE RECTAL BLEED Sever rectal bleeding Sever rectal bleeding SEVERE RECTAL BLEED   Ezetimibe Diarrhea   Other reaction(s): cough, Other - See Comments Cough Cough   Pregabalin Other (See Comments)   "weight gain" Weight gain   Apixaban    Dabigatran Etexilate Mesilate [dabigatran]    Dabigatran Etexilate Mesylate Other (See Comments)   Dilaudid [hydromorphone Hcl]    Fentanyl    Nauseated and makes pt jittery  Nauseated and makes pt jittery   Nitroglycerin    Other    Other reaction(s): Hives, Itching Pt states he breaks out in red bumps with adhesive materials (ex: patches)   Prednisone    Doesn't recall reaction but comfortable trying decadron inpatient while monitored. MD aware.    Tramadol         Medication List     STOP taking these medications    oxyCODONE-acetaminophen 10-325 MG tablet Commonly known as: PERCOCET       TAKE these medications    acyclovir 760 mg in dextrose 5 % 250 mL Inject 760 mg into the vein every 8 (eight) hours.   albuterol 108 (90 Base) MCG/ACT inhaler Commonly known as: VENTOLIN HFA Inhale 2 puffs into the lungs every 4 (four) hours as needed.   amLODipine 2.5 MG tablet Commonly known as: NORVASC Take 2.5 mg by mouth 2 (two) times daily.   ascorbic acid 250 MG tablet Commonly known as: VITAMIN C Take 250 mg by mouth daily.   atorvastatin 80 MG tablet Commonly known as: LIPITOR Take 80 mg by mouth daily.   cholecalciferol 10 MCG (400 UNIT) Tabs  tablet Commonly known as: VITAMIN D3 Take 400 Units by mouth daily.   clopidogrel 75 MG tablet Commonly known as: PLAVIX Take 75 mg by mouth daily.   clotrimazole 1 % cream Commonly known as: LOTRIMIN Apply 1 Application topically 2 (two) times daily as needed.   cyanocobalamin 1000 MCG tablet Commonly known as: VITAMIN B12 Take 1,000 mcg by mouth daily.   dexamethasone 10 MG/ML injection Commonly known as: DECADRON Inject 0.8 mLs (8 mg total) into the vein daily.   finasteride 5 MG tablet Commonly known as: PROSCAR Take 5 mg by mouth daily.   gabapentin 800 MG tablet Commonly known as: NEURONTIN Take 800 mg by mouth 3 (three) times daily. Take 800 mg by mouth in the morning, at noon, and at bedtime. What changed: Another medication with the same name was removed. Continue taking this medication, and follow the directions you see here.   HYDROcodone-acetaminophen 7.5-325 mg/15 ml solution Commonly known as: HYCET Take 7.5 mLs by mouth as directed.   ipratropium-albuterol 0.5-2.5 (3) MG/3ML Soln Commonly known as: DUONEB Take 3 mLs by nebulization every 6 (six) hours.   Klor-Con M20 20 MEQ  tablet Generic drug: potassium chloride SA Take 20 mEq by mouth daily.   lisinopril 2.5 MG tablet Commonly known as: ZESTRIL Take 2.5 mg by mouth daily.   magnesium oxide 400 (240 Mg) MG tablet Commonly known as: MAG-OX Take 400 mg by mouth daily.   metoprolol succinate 100 MG 24 hr tablet Commonly known as: TOPROL-XL Take 100 mg by mouth daily.   nicotine polacrilex 4 MG gum Commonly known as: NICORETTE Take 4 mg by mouth as needed for smoking cessation.   pantoprazole 40 MG tablet Commonly known as: PROTONIX Take 40 mg by mouth 2 (two) times daily.   tadalafil 20 MG tablet Commonly known as: CIALIS Take 20 mg by mouth daily as needed for erectile dysfunction.   tamsulosin 0.4 MG Caps capsule Commonly known as: FLOMAX Take 0.4 mg by mouth daily.   warfarin 7.5 MG  tablet Commonly known as: COUMADIN Take 7.5 mg by mouth daily.   zolpidem 10 MG tablet Commonly known as: AMBIEN Take 10 mg by mouth at bedtime.         Consultations: Neurology  Ophthalmology  Cardiology  Procedures/Studies:  [facility] Chest Port 1 View  Result Date: 01/26/2022 CLINICAL DATA:  Chest pain EXAM: PORTABLE CHEST 1 VIEW COMPARISON:  01/24/2022 FINDINGS: Stable asymmetric elevation right hemidiaphragm. The lungs are clear without focal pneumonia, edema, pneumothorax or pleural effusion. Cardiopericardial silhouette is at upper limits of normal for size. Right-sided permanent pacemaker/AICD noted. The visualized bony structures of the thorax are unremarkable. IMPRESSION: Stable exam. No acute cardiopulmonary findings. Electronically Signed   By: Kennith Center M.D.   On: 01/26/2022 12:15   CT HEAD WO CONTRAST ( )  Result Date: 01/25/2022 CLINICAL DATA:  Headache, dizziness, blurred vision left eye EXAM: CT HEAD WITHOUT CONTRAST TECHNIQUE: Contiguous axial images were obtained from the base of the skull through the vertex without intravenous contrast. RADIATION DOSE REDUCTION: This exam was performed according to the departmental dose-optimization program which includes automated exposure control, adjustment of the mA and/or kV according to patient size and/or use of iterative reconstruction technique. COMPARISON:  01/24/2022 FINDINGS: Brain: Stable scattered hypodensities throughout the periventricular and subcortical white matter consistent with chronic small vessel ischemic change. No evidence of acute infarct or hemorrhage. Lateral ventricles and midline structures are stable. No acute extra-axial fluid collections. No mass effect. Vascular: Stable atherosclerosis.  No hyperdense vessel. Skull: Normal. Negative for fracture or focal lesion. Sinuses/Orbits: No acute finding. Other: None. IMPRESSION: 1. Stable head CT, no acute intracranial process. Electronically Signed   By:  Sharlet Salina M.D.   On: 01/25/2022 17:49   CT ABDOMEN PELVIS WO CONTRAST  Result Date: 01/25/2022 CLINICAL DATA:  Abdominal pain. EXAM: CT ABDOMEN AND PELVIS WITHOUT CONTRAST TECHNIQUE: Multidetector CT imaging of the abdomen and pelvis was performed following the standard protocol without IV contrast. RADIATION DOSE REDUCTION: This exam was performed according to the departmental dose-optimization program which includes automated exposure control, adjustment of the mA and/or kV according to patient size and/or use of iterative reconstruction technique. COMPARISON:  06/25/2012. FINDINGS: Lower chest: Mild stable scarring or subsegmental atelectasis at the left lung base. Lung bases otherwise clear. Hepatobiliary: Normal liver. Gallbladder decompressed. No bile duct dilation. Pancreas: Unremarkable. No pancreatic ductal dilatation or surrounding inflammatory changes. Spleen: Normal in size without focal abnormality. Adrenals/Urinary Tract: Normal adrenal glands. Kidneys normal in size, orientation and position. 1.2 cm low-attenuation mass from the right kidney upper pole, consistent with a cyst. No follow-up recommended. No other renal masses,  no stones and no hydronephrosis. Normal ureters. Bladder decompressed. Stomach/Bowel: Stomach moderately distended, otherwise unremarkable. Small bowel and colon are normal in caliber. No wall thickening. No inflammation. Normal appendix visualized. Vascular/Lymphatic: Aortic atherosclerosis. No aneurysm. No enlarged lymph nodes. Reproductive: Unremarkable. Other: No abdominal wall hernia or abnormality. No abdominopelvic ascites. Musculoskeletal: No fracture or acute finding.  No bone lesion. IMPRESSION: 1. No acute findings within the abdomen or pelvis. 2. Aortic atherosclerosis. Electronically Signed   By: Amie Portlandavid  Ormond M.D.   On: 01/25/2022 12:14   CT ANGIO HEAD NECK W WO CM  Result Date: 01/24/2022 CLINICAL DATA:  Initial evaluation for neuro deficit, stroke  suspected, ataxia with blurry vision. EXAM: CT ANGIOGRAPHY HEAD AND NECK TECHNIQUE: Multidetector CT imaging of the head and neck was performed using the standard protocol during bolus administration of intravenous contrast. Multiplanar CT image reconstructions and MIPs were obtained to evaluate the vascular anatomy. Carotid stenosis measurements (when applicable) are obtained utilizing NASCET criteria, using the distal internal carotid diameter as the denominator. RADIATION DOSE REDUCTION: This exam was performed according to the departmental dose-optimization program which includes automated exposure control, adjustment of the mA and/or kV according to patient size and/or use of iterative reconstruction technique. CONTRAST:  75mL OMNIPAQUE IOHEXOL 350 MG/ML SOLN COMPARISON:  CT from earlier the same day. FINDINGS: CTA NECK FINDINGS Aortic arch: Visualized aortic arch normal in caliber with standard branch pattern. Mild atheromatous change about the arch itself. No significant stenosis about the origin the great vessels. Right carotid system: Right common and internal carotid arteries are patent without dissection. Mild atheromatous change about the right carotid bulb without hemodynamically significant greater than 50% stenosis. Left carotid system: Left common and internal carotid arteries are patent without dissection. Mild atheromatous change about the left carotid bulb without hemodynamically significant greater than 50% stenosis. Vertebral arteries: Left vertebral artery arises directly from the aortic arch. Right vertebral artery slightly dominant. Vertebral arteries patent without stenosis or dissection. Skeleton: No discrete or worrisome osseous lesions. Poor dentition noted. Torus mandibularis noted as well. Other neck: No other acute soft tissue abnormality within the neck. Right-sided Upper chest: Right-sided pacemaker/AICD noted. Upper lobe predominant emphysema. No other acute finding. Review of the  MIP images confirms the above findings CTA HEAD FINDINGS Anterior circulation: Atheromatous change within the carotid siphons with up to moderate stenoses bilaterally. A1 segments patent bilaterally. Right A1 hypoplastic. Normal anterior communicating complex. Anterior cerebral arteries patent without significant stenosis. No M1 stenosis or occlusion. No proximal MCA branch occlusion or high-grade stenosis. Distal MCA branches well perfused and symmetric, although demonstrates small vessel atheromatous irregularity. Posterior circulation: Both V4 segments patent without stenosis. Both PICA patent. Basilar patent without stenosis. Superior cerebral arteries patent bilaterally. Both PCAs primarily supplied via the basilar. Atheromatous irregularity within the PCAs without proximal high-grade stenosis. Venous sinuses: Patent allowing for timing the contrast bolus. Anatomic variants: None significant.  No aneurysm. Review of the MIP images confirms the above findings IMPRESSION: 1. Negative CTA for large vessel occlusion or other emergent finding. 2. Atheromatous change about the carotid siphons with up to moderate stenoses bilaterally. 3. Mild atheromatous irregularity elsewhere about the major arterial vasculature of the head and neck. No other hemodynamically significant or correctable stenosis. Aortic Atherosclerosis (ICD10-I70.0) and Emphysema (ICD10-J43.9). Electronically Signed   By: Rise MuBenjamin  McClintock M.D.   On: 01/24/2022 22:33   CT Head Wo Contrast  Result Date: 01/24/2022 CLINICAL DATA:  Left-sided head pain/eye pain. EXAM: CT HEAD WITHOUT CONTRAST TECHNIQUE: Contiguous  axial images were obtained from the base of the skull through the vertex without intravenous contrast. RADIATION DOSE REDUCTION: This exam was performed according to the departmental dose-optimization program which includes automated exposure control, adjustment of the mA and/or kV according to patient size and/or use of iterative  reconstruction technique. COMPARISON:  11/03/2016 FINDINGS: Brain: No evidence of acute infarction, hemorrhage, hydrocephalus, extra-axial collection or mass lesion/mass effect. Patchy low-density in the cerebral white matter which is chronic and presumed chronic small vessel ischemia. Vascular: No hyperdense vessel or unexpected calcification. Skull: Normal. Negative for fracture or focal lesion. Sinuses/Orbits: No acute finding. IMPRESSION: No acute finding or explanation for symptoms. Electronically Signed   By: Jorje Guild M.D.   On: 01/24/2022 17:12   DG Chest 2 View  Result Date: 01/24/2022 CLINICAL DATA:  Shortness of breath EXAM: CHEST - 2 VIEW COMPARISON:  08/01/2021 FINDINGS: Cardiac size is within normal limits. Possible coronary artery calcifications are seen. There are no signs of pulmonary edema or focal pulmonary consolidation. There is no pleural effusion or pneumothorax. Pacemaker battery is seen in right infraclavicular region with tips of leads in right atrium and right ventricle. IMPRESSION: No active cardiopulmonary disease. Electronically Signed   By: Elmer Picker M.D.   On: 01/24/2022 15:49     Subjective: -Continues to complain of persistent left-sided headache  Discharge Exam: BP 136/70 (BP Location: Right Arm)   Pulse 70   Temp 99.1 F (37.3 C)   Resp 18   Ht 5\' 6"  (1.676 m)   Wt 93.6 kg   SpO2 99%   BMI 33.31 kg/m   General: Pt is alert, awake, not in acute distress Cardiovascular: RRR, S1/S2 +, no rubs, no gallops Respiratory: CTA bilaterally, no wheezing, no rhonchi Abdominal: Soft, NT, ND, bowel sounds + Extremities: no edema, no cyanosis   The results of significant diagnostics from this hospitalization (including imaging, microbiology, ancillary and laboratory) are listed below for reference.     Microbiology: Recent Results (from the past 240 hour(s))  Resp panel by RT-PCR (RSV, Flu A&B, Covid) Anterior Nasal Swab     Status: None    Collection Time: 01/24/22  6:27 PM   Specimen: Anterior Nasal Swab  Result Value Ref Range Status   SARS Coronavirus 2 by RT PCR NEGATIVE NEGATIVE Final    Comment: (NOTE) SARS-CoV-2 target nucleic acids are NOT DETECTED.  The SARS-CoV-2 RNA is generally detectable in upper respiratory specimens during the acute phase of infection. The lowest concentration of SARS-CoV-2 viral copies this assay can detect is 138 copies/mL. A negative result does not preclude SARS-Cov-2 infection and should not be used as the sole basis for treatment or other patient management decisions. A negative result may occur with  improper specimen collection/handling, submission of specimen other than nasopharyngeal swab, presence of viral mutation(s) within the areas targeted by this assay, and inadequate number of viral copies(<138 copies/mL). A negative result must be combined with clinical observations, patient history, and epidemiological information. The expected result is Negative.  Fact Sheet for Patients:  EntrepreneurPulse.com.au  Fact Sheet for Healthcare Providers:  IncredibleEmployment.be  This test is no t yet approved or cleared by the [place] FDA and  has been authorized for detection and/or diagnosis of SARS-CoV-2 by FDA under an Emergency Use Authorization (EUA). This EUA will remain  in effect (meaning this test can be used) for the duration of the COVID-19 declaration under Section 564(b)(1) of the Act, 21 U.S.C.section 360bbb-3(b)(1), unless the authorization is terminated  or revoked sooner.       Influenza A by PCR NEGATIVE NEGATIVE Final   Influenza B by PCR NEGATIVE NEGATIVE Final    Comment: (NOTE) The Xpert Xpress SARS-CoV-2/FLU/RSV plus assay is intended as an aid in the diagnosis of influenza from Nasopharyngeal swab specimens and should not be used as a sole basis for treatment. Nasal washings and aspirates are unacceptable for  Xpert Xpress SARS-CoV-2/FLU/RSV testing.  Fact Sheet for Patients: EntrepreneurPulse.com.au  Fact Sheet for Healthcare Providers: IncredibleEmployment.be  This test is not yet approved or cleared by the [place] FDA and has been authorized for detection and/or diagnosis of SARS-CoV-2 by FDA under an Emergency Use Authorization (EUA). This EUA will remain in effect (meaning this test can be used) for the duration of the COVID-19 declaration under Section 564(b)(1) of the Act, 21 U.S.C. section 360bbb-3(b)(1), unless the authorization is terminated or revoked.     Resp Syncytial Virus by PCR NEGATIVE NEGATIVE Final    Comment: (NOTE) Fact Sheet for Patients: EntrepreneurPulse.com.au  Fact Sheet for Healthcare Providers: IncredibleEmployment.be  This test is not yet approved or cleared by the [place] FDA and has been authorized for detection and/or diagnosis of SARS-CoV-2 by FDA under an Emergency Use Authorization (EUA). This EUA will remain in effect (meaning this test can be used) for the duration of the COVID-19 declaration under Section 564(b)(1) of the Act, 21 U.S.C. section 360bbb-3(b)(1), unless the authorization is terminated or revoked.  Performed at [facility] [address] [place], [place] [address]      Labs: Basic Metabolic Panel: Recent Labs  Lab 01/24/22 1459 01/26/22 0503 01/27/22 0549  NA 136 138  --   K 3.9 3.7  --   CL 107 108  --   CO2 24 24  --   GLUCOSE 147* 125*  --   BUN 9 11  --   CREATININE 1.11 0.85 0.98  CALCIUM 8.9 8.4*  --   MG  --  2.1  --   PHOS  --  3.2  --    Liver Function Tests: No results for input(s): "AST", "ALT", "ALKPHOS", "BILITOT", "PROT", "ALBUMIN" in the last 168 hours. CBC: Recent Labs  Lab 01/24/22 1459 01/25/22 0502 01/26/22 0503 01/29/22 0549  WBC 5.7 5.6 6.3 15.1*  NEUTROABS 3.8  --   --   --   HGB 12.6*  11.9* 12.7* 11.8*  HCT 41.0 38.5* 40.2 37.6*  MCV 84.7 85.0 84.3 84.5  PLT 173 158 165 199   CBG: Recent Labs  Lab 01/24/22 2219 01/25/22 1124  GLUCAP 86 119*   Hgb A1c No results for input(s): "HGBA1C" in the last 72 hours. Lipid Profile No results for input(s): "CHOL", "HDL", "LDLCALC", "TRIG", "CHOLHDL", "LDLDIRECT" in the last 72 hours. Thyroid function studies No results for input(s): "TSH", "T4TOTAL", "T3FREE", "THYROIDAB" in the last 72 hours.  Invalid input(s): "FREET3" Urinalysis    Component Value Date/Time   COLORURINE STRAW (A) 01/24/2022 1827   APPEARANCEUR CLEAR (A) 01/24/2022 1827   APPEARANCEUR Clear 06/25/2012 1147   LABSPEC 1.006 01/24/2022 1827   LABSPEC 1.002 06/25/2012 1147   PHURINE 6.0 01/24/2022 1827   GLUCOSEU NEGATIVE 01/24/2022 1827   GLUCOSEU Negative 06/25/2012 1147   HGBUR NEGATIVE 01/24/2022 1827   BILIRUBINUR NEGATIVE 01/24/2022 1827   BILIRUBINUR Negative 06/25/2012 1147   [place] 01/24/2022 1827   PROTEINUR NEGATIVE 01/24/2022 1827   UROBILINOGEN 0.2 05/27/2007 1525   NITRITE NEGATIVE 01/24/2022 1827   LEUKOCYTESUR NEGATIVE  01/24/2022 1827   LEUKOCYTESUR Negative 06/25/2012 1147    FURTHER DISCHARGE INSTRUCTIONS:   Get Medicines reviewed and adjusted: Please take all your medications with you for your next visit with your Primary MD   Laboratory/radiological data: Please request your Primary MD to go over all hospital tests and procedure/radiological results at the follow up, please ask your Primary MD to get all Hospital records sent to his/her office.   In some cases, they will be blood work, cultures and biopsy results pending at the time of your discharge. Please request that your primary care M.D. goes through all the records of your hospital data and follows up on these results.   Also Note the following: If you experience worsening of your admission symptoms, develop shortness of breath, life threatening  emergency, suicidal or homicidal thoughts you must seek medical attention immediately by calling 911 or calling your MD immediately  if symptoms less severe.   You must read complete instructions/literature along with all the possible adverse reactions/side effects for all the Medicines you take and that have been prescribed to you. Take any new Medicines after you have completely understood and accpet all the possible adverse reactions/side effects.    Do not drive when taking Pain medications or sleeping medications (Benzodaizepines)   Do not take more than prescribed Pain, Sleep and Anxiety Medications. It is not advisable to combine anxiety,sleep and pain medications without talking with your primary care practitioner   Special Instructions: If you have smoked or chewed Tobacco  in the last 2 yrs please stop smoking, stop any regular Alcohol  and or any Recreational drug use.   Wear Seat belts while driving.   Please note: You were cared for by a hospitalist during your hospital stay. Once you are discharged, your primary care physician will handle any further medical issues. Please note that NO REFILLS for any discharge medications will be authorized once you are discharged, as it is imperative that you return to your primary care physician (or establish a relationship with a primary care physician if you do not have one) for your post hospital discharge needs so that they can reassess your need for medications and monitor your lab values.  Time coordinating discharge: 40 minutes  SIGNED:  Marzetta Board, MD, PhD 01/29/2022, 11:36 AM

## 2022-01-30 LAB — PROTIME-INR
INR: 3.4 — ABNORMAL HIGH (ref 0.8–1.2)
Prothrombin Time: 33.8 seconds — ABNORMAL HIGH (ref 11.4–15.2)

## 2022-01-30 MED ORDER — WARFARIN SODIUM 3 MG PO TABS
3.0000 mg | ORAL_TABLET | Freq: Once | ORAL | Status: AC
  Administered 2022-01-30: 3 mg via ORAL
  Filled 2022-01-30: qty 1

## 2022-01-30 MED ORDER — SENNOSIDES-DOCUSATE SODIUM 8.6-50 MG PO TABS
2.0000 | ORAL_TABLET | Freq: Two times a day (BID) | ORAL | Status: DC
  Administered 2022-01-30 (×2): 2 via ORAL
  Filled 2022-01-30 (×2): qty 2

## 2022-01-30 NOTE — Care Management Important Message (Signed)
Important Message  Patient Details  Name: [name] MRN: [id number] Date of Birth: 07-06-1955   Medicare Important Message Given:  N/A - LOS <3 / Initial given by admissions     Olegario Messier A Nolah Krenzer 01/30/2022, 8:52 AM

## 2022-01-30 NOTE — Progress Notes (Addendum)
Patient seen and examined this morning, discussed with him at bedside regarding delay in transportation to [facility] due to lack of beds.  He is feeling a little bit improved but still with left-sided headache and left-sided vision changes.  Vitals reviewed and have remained within acceptable parameters.  Physical and neurologic exam is unchanged.  He remains stable and appropriate for transfer to [facility], still awaiting bed.    Latesia Norrington M. Elvera Lennox, MD, PhD [facility]  Between 7 am - 7 pm you can contact me via Amion (for emergencies) or Securechat (non urgent matters).  I am not available 7 pm - 7 am, please contact night coverage MD/APP via Amion

## 2022-01-30 NOTE — TOC Transition Note (Signed)
Transition of Care [facility]) - CM/SW Discharge Note   Patient Details  Name: [name] MRN: [id number] Date of Birth: [birth year]  Transition of Care [facility]) CM/SW Contact:  Tempie Hoist, LCSWA Phone Number: 01/30/2022, 4:18 PM   Clinical Narrative:     The patient is [facility].   Final next level of care: Acute to Acute Transfer     Patient Goals and CMS Choice      Discharge Placement    [facility]              Patient to be transferred to facility by: EMS      Discharge Plan and Services Additional resources added to the After Visit Summary for                                       Social Determinants of Health (SDOH) Interventions SDOH Screenings   Food Insecurity: No Food Insecurity (01/25/2022)  Housing: Medium Risk (01/25/2022)  Transportation Needs: No Transportation Needs (01/25/2022)  Utilities: Not At Risk (01/25/2022)  Tobacco Use: Medium Risk (01/25/2022)     Readmission Risk Interventions     No data to display

## 2022-01-30 NOTE — Progress Notes (Signed)
ANTICOAGULATION CONSULT NOTE   Pharmacy Consult for Warfarin  Indication: atrial fibrillation  Allergies  Allergen Reactions   Celecoxib Other (See Comments)    Other reaction(s): GI Bleed, Other - See Comments GI BLEEDING Other Reaction: GI BLEEDING GI bleeding    Ciprofloxacin Anaphylaxis and Other (See Comments)    Other reaction(s): Other - See Comments Pt went into At Fib Other Reaction: PUT PT IN A FIB    Dicyclomine     Other reaction(s): chest pain   Hydromorphone Nausea And Vomiting    Other reaction(s): Vomiting  Nausea and vomiting   Oxycodone-Acetaminophen Nausea And Vomiting    Other reaction(s): Vomiting   Ranolazine Anaphylaxis    Patient describes abdominal swelling/bloating that radiates to neck/throat Patient describes abdominal swelling/bloating that radiates to neck/throat    Rivaroxaban Rash    Other reaction(s): Other (See Comments), Other - See Comments SEVERE RECTAL BLEED Sever rectal bleeding Sever rectal bleeding SEVERE RECTAL BLEED    Ezetimibe Diarrhea    Other reaction(s): cough, Other - See Comments Cough Cough    Pregabalin Other (See Comments)    "weight gain"  Weight gain   Apixaban    Dabigatran Etexilate Mesilate [Dabigatran]    Dabigatran Etexilate Mesylate Other (See Comments)   Dilaudid [Hydromorphone Hcl]    Fentanyl     Nauseated and makes pt jittery   Nauseated and makes pt jittery   Nitroglycerin    Other     Other reaction(s): Hives, Itching Pt states he breaks out in red bumps with adhesive materials (ex: patches)   Prednisone     Doesn't recall reaction but comfortable trying decadron inpatient while monitored. MD aware.    Tramadol     Patient Measurements: Height: 5\' 6"  (167.6 cm) Weight: 93.6 kg (206 lb 5.6 oz) IBW/kg (Calculated) : 63.8 Heparin Dosing Weight:   Vital Signs: Temp: 98.2 F (36.8 C) (12/28 0331) Temp Source: Oral (12/28 0331) BP: 121/65 (12/28 0331) Pulse Rate: 55 (12/28  0331)  Labs: Recent Labs    01/28/22 0728 01/28/22 0906 01/29/22 0549 01/30/22 0351  HGB  --   --  11.8*  --   HCT  --   --  37.6*  --   PLT  --   --  199  --   LABPROT 30.4*  --  34.2* 33.8*  INR 2.9*  --  3.4* 3.4*  TROPONINIHS  --  17 15  --      Estimated Creatinine Clearance: 81.5 mL/min (by C-G formula based on SCr of 0.98 mg/dL).   Medical History: Past Medical History:  Diagnosis Date   Atrial fibrillation (HCC)    CHF (congestive heart failure) (HCC)    Diabetes mellitus without complication (HCC)    Factor 5 Leiden mutation, heterozygous (HCC)    Hypertension    Pacemaker    Sarcoidosis     Medications:  Medications Prior to Admission  Medication Sig Dispense Refill Last Dose   albuterol (VENTOLIN HFA) 108 (90 Base) MCG/ACT inhaler Inhale 2 puffs into the lungs every 4 (four) hours as needed.   prn at prn   amLODipine (NORVASC) 2.5 MG tablet Take 2.5 mg by mouth 2 (two) times daily.   01/24/2022 at 0930   ascorbic acid (VITAMIN C) 250 MG tablet Take 250 mg by mouth daily.   01/24/2022 at 0930   atorvastatin (LIPITOR) 80 MG tablet Take 80 mg by mouth daily.   01/24/2022 at 0930   cholecalciferol (VITAMIN D3) 10 MCG (  400 UNIT) TABS tablet Take 400 Units by mouth daily.   01/24/2022 at 0930   clopidogrel (PLAVIX) 75 MG tablet Take 75 mg by mouth daily.   01/24/2022 at 0930   cyanocobalamin (VITAMIN B12) 1000 MCG tablet Take 1,000 mcg by mouth daily.   01/24/2022 at 0930   finasteride (PROSCAR) 5 MG tablet Take 5 mg by mouth daily.   01/24/2022 at 0930   gabapentin (NEURONTIN) 800 MG tablet Take 800 mg by mouth 3 (three) times daily. Take 800 mg by mouth in the morning, at noon, and at bedtime.   01/24/2022 at 0930   HYDROcodone-acetaminophen (HYCET) 7.5-325 mg/15 ml solution Take 7.5 mLs by mouth as directed.   prn at prn   ipratropium-albuterol (DUONEB) 0.5-2.5 (3) MG/3ML SOLN Take 3 mLs by nebulization every 6 (six) hours.   prn at prn   lisinopril (ZESTRIL)  2.5 MG tablet Take 2.5 mg by mouth daily.   01/24/2022 at 0930   magnesium oxide (MAG-OX) 400 (240 Mg) MG tablet Take 400 mg by mouth daily.   01/24/2022 at 0930   metoprolol succinate (TOPROL-XL) 100 MG 24 hr tablet Take 100 mg by mouth daily.   01/24/2022 at 0930   nicotine polacrilex (NICORETTE) 4 MG gum Take 4 mg by mouth as needed for smoking cessation.   prn at prn   pantoprazole (PROTONIX) 40 MG tablet Take 40 mg by mouth 2 (two) times daily.   01/24/2022 at 0930   potassium chloride SA (KLOR-CON M20) 20 MEQ tablet Take 20 mEq by mouth daily.   01/24/2022 at 0930   tadalafil (CIALIS) 20 MG tablet Take 20 mg by mouth daily as needed for erectile dysfunction.   prn at prn   tamsulosin (FLOMAX) 0.4 MG CAPS capsule Take 0.4 mg by mouth daily.   01/24/2022 at 0930   warfarin (COUMADIN) 7.5 MG tablet Take 7.5 mg by mouth daily.   01/24/2022 at 0930   zolpidem (AMBIEN) 10 MG tablet Take 10 mg by mouth at bedtime.   01/23/2022 at 2200   clotrimazole (LOTRIMIN) 1 % cream Apply 1 Application topically 2 (two) times daily as needed. (Patient not taking: Reported on 01/24/2022)   Not Taking   gabapentin (NEURONTIN) 600 MG tablet Take 600 mg by mouth in the morning, at noon, and at bedtime. (Patient not taking: Reported on 01/24/2022)   Not Taking   oxyCODONE-acetaminophen (PERCOCET) 10-325 MG tablet Take 1 tablet by mouth every 6 (six) hours as needed for pain. (Patient not taking: Reported on 01/24/2022)   Not Taking    Assessment: Pharmacy consulted to dose warfarin for Afib in this 66 year old male admitted for possible stroke.  Pt was on warfarin 7.5 mg PO daily PTA, last dose on 12/22. Pt checks INR at home and adjusts his home warfarin dose based on level. No major DDIs, except for plavix which can increase risk of bleeding.   12/22: INR = 2.9  12/23: INR = 2.5 7.5 mg  12/24: INR = 1.7 10 mg 12/25: INR = 1.9 7.5 mg 12/26: INR = 2.9 4 mg 12/27: INR = 3.4 4 mg 12/28: INR = 3.4 4 mg   Goal  of Therapy:  INR 2-3   Plan:  - INR remained the same as yesterday, likely d/t the effects of the 10mg  and 7.5mg  doses received on 12/24 and 12/25, respectively. Will reorder Warfarin 4mg  again for today as holding dose may drastically cause INR drop in next few days. Suspected INR  may still be slightly elevated tomorrow, but closer to 3.0. - INR daily, CBC at least every 3 days.    Bettey Costa, PharmD Clinical Pharmacist 01/30/2022 7:44 AM

## 2022-01-31 ENCOUNTER — Inpatient Hospital Stay (HOSPITAL_COMMUNITY): Payer: Medicare HMO

## 2022-01-31 ENCOUNTER — Encounter (HOSPITAL_COMMUNITY): Payer: Self-pay | Admitting: Internal Medicine

## 2022-01-31 ENCOUNTER — Inpatient Hospital Stay (HOSPITAL_COMMUNITY)
Admission: RE | Admit: 2022-01-31 | Discharge: 2022-02-02 | DRG: 103 | Disposition: A | Discharge status: Home / Self Care | Payer: Medicare HMO | Attending: Internal Medicine | Admitting: Internal Medicine

## 2022-01-31 ENCOUNTER — Other Ambulatory Visit: Payer: Self-pay

## 2022-01-31 DIAGNOSIS — Z888 Allergy status to other drugs, medicaments and biological substances status: Secondary | ICD-10-CM | POA: Diagnosis not present

## 2022-01-31 DIAGNOSIS — K625 Hemorrhage of anus and rectum: Secondary | ICD-10-CM | POA: Diagnosis not present

## 2022-01-31 DIAGNOSIS — Z886 Allergy status to analgesic agent status: Secondary | ICD-10-CM | POA: Diagnosis not present

## 2022-01-31 DIAGNOSIS — I10 Essential (primary) hypertension: Secondary | ICD-10-CM | POA: Diagnosis present

## 2022-01-31 DIAGNOSIS — Z83438 Family history of other disorder of lipoprotein metabolism and other lipidemia: Secondary | ICD-10-CM | POA: Diagnosis not present

## 2022-01-31 DIAGNOSIS — I480 Paroxysmal atrial fibrillation: Secondary | ICD-10-CM | POA: Diagnosis present

## 2022-01-31 DIAGNOSIS — E782 Mixed hyperlipidemia: Secondary | ICD-10-CM | POA: Diagnosis present

## 2022-01-31 DIAGNOSIS — I5032 Chronic diastolic (congestive) heart failure: Secondary | ICD-10-CM | POA: Diagnosis present

## 2022-01-31 DIAGNOSIS — Z833 Family history of diabetes mellitus: Secondary | ICD-10-CM | POA: Diagnosis not present

## 2022-01-31 DIAGNOSIS — T380X5A Adverse effect of glucocorticoids and synthetic analogues, initial encounter: Secondary | ICD-10-CM | POA: Diagnosis present

## 2022-01-31 DIAGNOSIS — K921 Melena: Secondary | ICD-10-CM | POA: Diagnosis present

## 2022-01-31 DIAGNOSIS — Z8249 Family history of ischemic heart disease and other diseases of the circulatory system: Secondary | ICD-10-CM

## 2022-01-31 DIAGNOSIS — D6851 Activated protein C resistance: Secondary | ICD-10-CM | POA: Diagnosis present

## 2022-01-31 DIAGNOSIS — I2510 Atherosclerotic heart disease of native coronary artery without angina pectoris: Secondary | ICD-10-CM | POA: Diagnosis present

## 2022-01-31 DIAGNOSIS — Z885 Allergy status to narcotic agent status: Secondary | ICD-10-CM

## 2022-01-31 DIAGNOSIS — Z95810 Presence of automatic (implantable) cardiac defibrillator: Secondary | ICD-10-CM | POA: Diagnosis present

## 2022-01-31 DIAGNOSIS — Z79899 Other long term (current) drug therapy: Secondary | ICD-10-CM

## 2022-01-31 DIAGNOSIS — E1165 Type 2 diabetes mellitus with hyperglycemia: Secondary | ICD-10-CM | POA: Diagnosis present

## 2022-01-31 DIAGNOSIS — H5712 Ocular pain, left eye: Secondary | ICD-10-CM | POA: Diagnosis present

## 2022-01-31 DIAGNOSIS — E1169 Type 2 diabetes mellitus with other specified complication: Secondary | ICD-10-CM | POA: Diagnosis present

## 2022-01-31 DIAGNOSIS — Z955 Presence of coronary angioplasty implant and graft: Secondary | ICD-10-CM | POA: Diagnosis not present

## 2022-01-31 DIAGNOSIS — Z881 Allergy status to other antibiotic agents status: Secondary | ICD-10-CM | POA: Diagnosis not present

## 2022-01-31 DIAGNOSIS — Z8673 Personal history of transient ischemic attack (TIA), and cerebral infarction without residual deficits: Secondary | ICD-10-CM

## 2022-01-31 DIAGNOSIS — R519 Headache, unspecified: Secondary | ICD-10-CM | POA: Diagnosis not present

## 2022-01-31 DIAGNOSIS — G43909 Migraine, unspecified, not intractable, without status migrainosus: Principal | ICD-10-CM | POA: Diagnosis present

## 2022-01-31 DIAGNOSIS — I110 Hypertensive heart disease with heart failure: Secondary | ICD-10-CM | POA: Diagnosis present

## 2022-01-31 DIAGNOSIS — Z87891 Personal history of nicotine dependence: Secondary | ICD-10-CM | POA: Diagnosis not present

## 2022-01-31 DIAGNOSIS — Z7902 Long term (current) use of antithrombotics/antiplatelets: Secondary | ICD-10-CM

## 2022-01-31 DIAGNOSIS — Z7901 Long term (current) use of anticoagulants: Secondary | ICD-10-CM

## 2022-01-31 LAB — PROTIME-INR
INR: 2.9 — ABNORMAL HIGH (ref 0.8–1.2)
Prothrombin Time: 30.1 seconds — ABNORMAL HIGH (ref 11.4–15.2)

## 2022-01-31 LAB — CBC WITH DIFFERENTIAL/PLATELET
Abs Immature Granulocytes: 0.09 10*3/uL — ABNORMAL HIGH (ref 0.00–0.07)
Basophils Absolute: 0 10*3/uL (ref 0.0–0.1)
Basophils Relative: 0 %
Eosinophils Absolute: 0 10*3/uL (ref 0.0–0.5)
Eosinophils Relative: 0 %
HCT: 37.4 % — ABNORMAL LOW (ref 39.0–52.0)
Hemoglobin: 12.3 g/dL — ABNORMAL LOW (ref 13.0–17.0)
Immature Granulocytes: 1 %
Lymphocytes Relative: 8 %
Lymphs Abs: 1.1 10*3/uL (ref 0.7–4.0)
MCH: 27.2 pg (ref 26.0–34.0)
MCHC: 32.9 g/dL (ref 30.0–36.0)
MCV: 82.7 fL (ref 80.0–100.0)
Monocytes Absolute: 0.8 10*3/uL (ref 0.1–1.0)
Monocytes Relative: 6 %
Neutro Abs: 11 10*3/uL — ABNORMAL HIGH (ref 1.7–7.7)
Neutrophils Relative %: 85 %
Platelets: 192 10*3/uL (ref 150–400)
RBC: 4.52 MIL/uL (ref 4.22–5.81)
RDW: 14.5 % (ref 11.5–15.5)
WBC: 12.9 10*3/uL — ABNORMAL HIGH (ref 4.0–10.5)
nRBC: 0 % (ref 0.0–0.2)

## 2022-01-31 LAB — BASIC METABOLIC PANEL
Anion gap: 8 (ref 5–15)
BUN: 19 mg/dL (ref 8–23)
CO2: 24 mmol/L (ref 22–32)
Calcium: 8.6 mg/dL — ABNORMAL LOW (ref 8.9–10.3)
Chloride: 99 mmol/L (ref 98–111)
Creatinine, Ser: 1.19 mg/dL (ref 0.61–1.24)
GFR, Estimated: >60 mL/min (ref >60)
Glucose, Bld: 395 mg/dL — ABNORMAL HIGH (ref 70–99)
Potassium: 4.4 mmol/L (ref 3.5–5.1)
Sodium: 131 mmol/L — ABNORMAL LOW (ref 135–145)

## 2022-01-31 LAB — HEMOGLOBIN A1C
Hgb A1c MFr Bld: 5.7 % — ABNORMAL HIGH (ref 4.8–5.6)
Mean Plasma Glucose: 116.89 mg/dL

## 2022-01-31 LAB — GLUCOSE, CAPILLARY
Glucose-Capillary: 451 mg/dL — ABNORMAL HIGH (ref 70–99)
Glucose-Capillary: 487 mg/dL — ABNORMAL HIGH (ref 70–99)
Glucose-Capillary: 348 mg/dL — ABNORMAL HIGH (ref 70–99)

## 2022-01-31 MED ORDER — LISINOPRIL 2.5 MG PO TABS
2.5000 mg | ORAL_TABLET | Freq: Every day | ORAL | Status: DC
  Administered 2022-01-31 – 2022-02-02 (×3): 2.5 mg via ORAL
  Filled 2022-01-31 (×3): qty 1

## 2022-01-31 MED ORDER — INSULIN ASPART 100 UNIT/ML IJ SOLN: 0.0000 [IU] | Freq: Three times a day (TID) | INTRAMUSCULAR | Status: DC

## 2022-01-31 MED ORDER — FINASTERIDE 5 MG PO TABS
5.0000 mg | ORAL_TABLET | Freq: Every day | ORAL | Status: DC
  Administered 2022-01-31 – 2022-02-02 (×3): 5 mg via ORAL
  Filled 2022-01-31 (×3): qty 1

## 2022-01-31 MED ORDER — INSULIN ASPART 100 UNIT/ML IJ SOLN
0.0000 [IU] | Freq: Every day | INTRAMUSCULAR | Status: DC
  Administered 2022-01-31: 4 [IU] via SUBCUTANEOUS
  Administered 2022-02-01: 3 [IU] via SUBCUTANEOUS

## 2022-01-31 MED ORDER — METOPROLOL SUCCINATE ER 100 MG PO TB24
100.0000 mg | ORAL_TABLET | Freq: Every day | ORAL | Status: DC
  Administered 2022-01-31 – 2022-02-02 (×3): 100 mg via ORAL
  Filled 2022-01-31 (×3): qty 1

## 2022-01-31 MED ORDER — WARFARIN - PHARMACIST DOSING INPATIENT: Freq: Every day | Status: DC

## 2022-01-31 MED ORDER — PANTOPRAZOLE SODIUM 40 MG PO TBEC
40.0000 mg | DELAYED_RELEASE_TABLET | Freq: Two times a day (BID) | ORAL | Status: DC
  Administered 2022-01-31 – 2022-02-02 (×5): 40 mg via ORAL
  Filled 2022-01-31 (×5): qty 1

## 2022-01-31 MED ORDER — ALBUTEROL SULFATE (2.5 MG/3ML) 0.083% IN NEBU
2.5000 mg | INHALATION_SOLUTION | RESPIRATORY_TRACT | Status: DC | PRN
  Administered 2022-02-01: 2.5 mg via RESPIRATORY_TRACT
  Filled 2022-01-31: qty 3

## 2022-01-31 MED ORDER — WARFARIN SODIUM 7.5 MG PO TABS: 7.5000 mg | ORAL_TABLET | Freq: Every day | ORAL | Status: DC

## 2022-01-31 MED ORDER — AMLODIPINE BESYLATE 2.5 MG PO TABS
2.5000 mg | ORAL_TABLET | Freq: Two times a day (BID) | ORAL | Status: DC
  Administered 2022-01-31 – 2022-02-02 (×5): 2.5 mg via ORAL
  Filled 2022-01-31 (×5): qty 1

## 2022-01-31 MED ORDER — WARFARIN SODIUM 4 MG PO TABS
4.0000 mg | ORAL_TABLET | Freq: Once | ORAL | Status: AC
  Administered 2022-01-31: 4 mg via ORAL
  Filled 2022-01-31: qty 1

## 2022-01-31 MED ORDER — DEXTROSE 5 % IV SOLN
760.0000 mg | Freq: Three times a day (TID) | INTRAVENOUS | Status: DC
  Administered 2022-01-31 – 2022-02-01 (×4): 760 mg via INTRAVENOUS
  Filled 2022-01-31 (×6): qty 15.2

## 2022-01-31 MED ORDER — TAMSULOSIN HCL 0.4 MG PO CAPS
0.4000 mg | ORAL_CAPSULE | Freq: Every day | ORAL | Status: DC
  Administered 2022-01-31 – 2022-02-02 (×3): 0.4 mg via ORAL
  Filled 2022-01-31 (×3): qty 1

## 2022-01-31 MED ORDER — SODIUM CHLORIDE 0.9 % IV SOLN: INTRAVENOUS | Status: DC

## 2022-01-31 MED ORDER — VITAMIN C 500 MG PO TABS
500.0000 mg | ORAL_TABLET | Freq: Every day | ORAL | Status: DC
  Administered 2022-01-31 – 2022-02-02 (×3): 500 mg via ORAL
  Filled 2022-01-31 (×3): qty 1

## 2022-01-31 MED ORDER — INSULIN ASPART 100 UNIT/ML IJ SOLN
0.0000 [IU] | Freq: Three times a day (TID) | INTRAMUSCULAR | Status: DC
  Administered 2022-02-01: 11 [IU] via SUBCUTANEOUS
  Administered 2022-02-01 (×2): 7 [IU] via SUBCUTANEOUS
  Administered 2022-02-02: 4 [IU] via SUBCUTANEOUS
  Administered 2022-02-02: 7 [IU] via SUBCUTANEOUS

## 2022-01-31 MED ORDER — VITAMIN B-12 1000 MCG PO TABS
1000.0000 ug | ORAL_TABLET | Freq: Every day | ORAL | Status: DC
  Administered 2022-01-31 – 2022-02-02 (×3): 1000 ug via ORAL
  Filled 2022-01-31 (×3): qty 1

## 2022-01-31 MED ORDER — ATORVASTATIN CALCIUM 80 MG PO TABS
80.0000 mg | ORAL_TABLET | Freq: Every day | ORAL | Status: DC
  Administered 2022-01-31 – 2022-02-02 (×3): 80 mg via ORAL
  Filled 2022-01-31 (×3): qty 1

## 2022-01-31 MED ORDER — HYDROCODONE-ACETAMINOPHEN 7.5-325 MG/15ML PO SOLN
10.0000 mL | Freq: Four times a day (QID) | ORAL | Status: DC | PRN
  Administered 2022-01-31 – 2022-02-02 (×6): 10 mL via ORAL
  Filled 2022-01-31 (×6): qty 15

## 2022-01-31 MED ORDER — IPRATROPIUM-ALBUTEROL 0.5-2.5 (3) MG/3ML IN SOLN
3.0000 mL | Freq: Four times a day (QID) | RESPIRATORY_TRACT | Status: DC
  Filled 2022-01-31: qty 3

## 2022-01-31 MED ORDER — NICOTINE POLACRILEX 2 MG MT GUM
4.0000 mg | CHEWING_GUM | OROMUCOSAL | Status: DC | PRN
  Administered 2022-01-31 – 2022-02-02 (×8): 4 mg via ORAL
  Filled 2022-01-31 (×10): qty 2

## 2022-01-31 MED ORDER — DEXAMETHASONE SODIUM PHOSPHATE 10 MG/ML IJ SOLN
8.0000 mg | INTRAMUSCULAR | Status: DC
  Administered 2022-01-31: 8 mg via INTRAVENOUS
  Filled 2022-01-31 (×2): qty 1

## 2022-01-31 MED ORDER — POTASSIUM CHLORIDE CRYS ER 20 MEQ PO TBCR
20.0000 meq | EXTENDED_RELEASE_TABLET | Freq: Every day | ORAL | Status: DC
  Administered 2022-01-31 – 2022-02-02 (×3): 20 meq via ORAL
  Filled 2022-01-31 (×3): qty 1

## 2022-01-31 MED ORDER — GABAPENTIN 400 MG PO CAPS
800.0000 mg | ORAL_CAPSULE | Freq: Three times a day (TID) | ORAL | Status: DC
  Administered 2022-01-31 – 2022-02-02 (×8): 800 mg via ORAL
  Filled 2022-01-31 (×8): qty 2

## 2022-01-31 MED ORDER — MAGNESIUM OXIDE -MG SUPPLEMENT 400 (240 MG) MG PO TABS
400.0000 mg | ORAL_TABLET | Freq: Every day | ORAL | Status: DC
  Administered 2022-01-31 – 2022-02-02 (×3): 400 mg via ORAL
  Filled 2022-01-31 (×3): qty 1

## 2022-01-31 MED ORDER — CLOPIDOGREL BISULFATE 75 MG PO TABS
75.0000 mg | ORAL_TABLET | Freq: Every day | ORAL | Status: DC
  Administered 2022-01-31 – 2022-02-02 (×3): 75 mg via ORAL
  Filled 2022-01-31 (×3): qty 1

## 2022-01-31 MED ORDER — HYDRALAZINE HCL 20 MG/ML IJ SOLN: 10.0000 mg | Freq: Four times a day (QID) | INTRAMUSCULAR | Status: DC | PRN

## 2022-01-31 MED ORDER — INSULIN ASPART 100 UNIT/ML IJ SOLN: 15.0000 [IU] | Freq: Once | INTRAMUSCULAR | Status: DC

## 2022-01-31 MED ORDER — MORPHINE SULFATE (PF) 2 MG/ML IV SOLN
2.0000 mg | INTRAVENOUS | Status: AC | PRN
  Administered 2022-01-31 (×2): 2 mg via INTRAVENOUS
  Filled 2022-01-31 (×2): qty 1

## 2022-01-31 NOTE — Assessment & Plan Note (Addendum)
History of multiple stents in the past with most recent cardiac catheterization 09/20/2021 at [facility] Patient is currently chest pain free Continue home regimen of antiplatelet therap with Plavix, lipid-lowering therapy with Lipitor and metoprolol.

## 2022-01-31 NOTE — Assessment & Plan Note (Addendum)
Markedly elevated sugars in the setting of systemic steroids Accu-Cheks before every meal and nightly with sliding scale insulin Obtaining hemoglobin A1c Basal insulin therapy or increasing intensity sliding scale may need to be ordered if hyperglycemia persists.

## 2022-01-31 NOTE — Progress Notes (Signed)
ANTICOAGULATION CONSULT NOTE   Pharmacy Consult for warfarin  Indication: atrial fibrillation and factor V Leiden  Allergies  Allergen Reactions   Celecoxib Other (See Comments)    Other reaction(s): GI Bleed, Other - See Comments GI BLEEDING Other Reaction: GI BLEEDING GI bleeding    Ciprofloxacin Anaphylaxis and Other (See Comments)    Other reaction(s): Other - See Comments Pt went into At Fib Other Reaction: PUT PT IN A FIB    Dicyclomine     Other reaction(s): chest pain   Hydromorphone Nausea And Vomiting    Other reaction(s): Vomiting  Nausea and vomiting   Oxycodone-Acetaminophen Nausea And Vomiting    Other reaction(s): Vomiting   Ranolazine Anaphylaxis    Patient describes abdominal swelling/bloating that radiates to neck/throat Patient describes abdominal swelling/bloating that radiates to neck/throat    Rivaroxaban Rash    Other reaction(s): Other (See Comments), Other - See Comments SEVERE RECTAL BLEED Sever rectal bleeding Sever rectal bleeding SEVERE RECTAL BLEED    Ezetimibe Diarrhea    Other reaction(s): cough, Other - See Comments Cough Cough    Pregabalin Other (See Comments)    "weight gain"  Weight gain   Apixaban    Dabigatran Etexilate Mesilate [Dabigatran]    Dabigatran Etexilate Mesylate Other (See Comments)   Dilaudid [Hydromorphone Hcl]    Fentanyl     Nauseated and makes pt jittery   Nauseated and makes pt jittery   Nitroglycerin    Other     Other reaction(s): Hives, Itching Pt states he breaks out in red bumps with adhesive materials (ex: patches)   Prednisone     Doesn't recall reaction but comfortable trying decadron inpatient while monitored. MD aware.    Tramadol     Patient Measurements: Height: 5\' Shawn"  (167.Shawn cm) Weight: 93.9 kg (207 lb 0.2 oz) IBW/kg (Calculated) : 63.8 Heparin Dosing Weight:   Vital Signs: Temp: 98 F (36.7 C) (12/29 0759) Temp Source: Oral (12/29 0759) BP: 133/77 (12/29 0759) Pulse Rate: 60  (12/29 0910)  Labs: Recent Labs    01/29/22 0549 01/30/22 0351 01/31/22 0651 01/31/22 1436  HGB 11.8*  --  12.3*  --   HCT 37.Shawn*  --  37.4*  --   PLT 199  --  192  --   LABPROT 34.2* 33.8*  --  30.1*  INR 3.4* 3.4*  --  2.9*  CREATININE  --   --  1.19  --   TROPONINIHS 15  --   --   --      Estimated Creatinine Clearance: 67.2 mL/min (by C-G formula based on SCr of 1.19 mg/dL).  Assessment: [name] admitted to [facility] on 12/22 with left temporal headache and visual disturbance. 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 He was transferred to [facility] for an MRI that is compatible with his ICD. He takes warfarin PTA for Afib and factor V Leiden. Pharmacy consulted to manage warfarin.  INR therapeutic at 2.9 after being supratherapeutic for 2 days. No bleeding noted, Hgb stable 11-12s, platelets are normal.    Warfarin PTA: 7.5 mg/d; checks INR at home and adjusts dose based on level        Goal of Therapy:  INR 2-3   Plan:  Warfarin 4 mg PO tonight INR daily Monitor for s/sx of bleeding  Thank you for involving pharmacy in this patient's care.  [facility], PharmD, BCPS Clinical Pharmacist Clinical phone for 01/31/2022 is [phone] 01/31/2022 3:18 PM

## 2022-01-31 NOTE — Assessment & Plan Note (Addendum)
Patient reports marginal improvement but overall is still complaining of severe left-sided headaches and blurry vision We will proceed with attempting to obtain MRI of the brain with and without contrast with MRI of the orbits per neurology recommendations via the MRI conditional protocol Continuing acyclovir Continuing Decadron Case discussed with Dr. Wilford Corner with neurology who is recommending continued intravenous acyclovir with oral Decadron Unfortunately, after further review the MRI department tells [place] that the patient's cardiac device is not MRI safe and therefore we cannot proceed with the MRI of the brain and orbits as originally planned. Will page ophthalmology here and discuss case to identify whether they would be able to evaluate the patient at the bedside while hospitalized versus seeing the patient in the outpatient setting.

## 2022-01-31 NOTE — Assessment & Plan Note (Addendum)
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  Exact date of placement is unclear with both 01/13/2020 and 06/16/2020 both in last cardiology note.   Now seems to follow-up with [facility] cardiology/electrophysiology last documented visit 10/2021.

## 2022-01-31 NOTE — Plan of Care (Addendum)
Initial plan was to treat for zoster. Ophthal consult completed. No suspicion for zoster. Elevated CRP with concern for GCA. Max CRP is 1.1 ESR is 5- do not think that is elevated enough to suspect GCA.  Full consult to follow.  -- Amie Portland, MD Neurologist [facility] Pager: [phone]

## 2022-01-31 NOTE — H&P (Signed)
History and Physical    Patient: [name] MRN: [id number] DOA: 01/31/2022  Date of Service: the patient was seen and examined on 01/31/2022  Patient coming from:  [place] [facility]  Chief Complaint:  Blurry vision, temporal headache  HPI:   66 year old male with past medical history of coronary artery disease status post numberous stents (last cardiac catheterization 09/2021 at [facility]), cardiac sarcoidosis S/P ICD/pacemaker placement ([employer]. Jude Model Willis [id number], MRI conditional), atrial fibrillation, hypertension, diabetes mellitus type 2, diastolic congestive heart failure, hyperlipidemia, factor V Leiden mutation who originally presented to [facility] on 12/22 with complaints of left temporal pain associated with blurry vision in the left eye.  Patient additionally is complaining of unsteady gait.  The hospitalist group was then called and the patient was initially hospitalized due to concerns for possible temporal arteritis on the [place] hospitalist service.  Upon admission both ophthalmology and neurology were consulted.  Initial remote recommendations by ophthalmology was to empirically initiate acyclovir and systemic steroids with cool compresses.  Neurology has additionally evaluated the patient in consultation and followed along.  There has been clinical concern for the possibility of herpes zoster ophthalmicus or even possibly neurosarcoidosis.  To help evaluate for this they have  recommended further evaluation with MRI of the brain with and without contrast as well as MRI imaging of the orbits.  It was determined that patient's ICD is MRI conditional and therefore patient has been transferred to [facility] to obtain this MRI.  While at [facility], hospital course was complicated by atrial fibrillation with rapid ventricular response and bouts of chest discomfort.  Cardiology was additionally consulted.  Invasive ischemic  evaluation was not recommended during this hospitalization.  Patient has since converted back to normal sinus rhythm with dosing of intravenous metoprolol.  Hospital course was also complicated by several small bouts of melena.  Patient is a known history of small bowel AVMs.  Hemoglobin hematocrit have been stable and therefore gastroenterology has not been consulted.       Review of Systems: Review of Systems  Eyes:  Positive for blurred vision.  Neurological:  Positive for headaches.  All other systems reviewed and are negative.    Past Medical History:  Diagnosis Date   Atrial fibrillation (HCC)    CHF (congestive heart failure) ([place])    Diabetes mellitus without complication ([place])    Factor 5 Leiden mutation, heterozygous ([place])    Hypertension    Pacemaker    Sarcoidosis     Past Surgical History:  Procedure Laterality Date   CARDIAC SURGERY     HERNIA REPAIR     TRANSURETHRAL RESECTION OF PROSTATE      Social History:  reports that he has quit smoking. He has never used smokeless tobacco. He reports that he does not drink alcohol and does not use drugs.  Allergies  Allergen Reactions   Celecoxib Other (See Comments)    Other reaction(s): GI Bleed, Other - See Comments GI BLEEDING Other Reaction: GI BLEEDING GI bleeding    Ciprofloxacin Anaphylaxis and Other (See Comments)    Other reaction(s): Other - See Comments Pt went into At Fib Other Reaction: PUT PT IN A FIB    Dicyclomine     Other reaction(s): chest pain   Hydromorphone Nausea And Vomiting    Other reaction(s): Vomiting  Nausea and vomiting   Oxycodone-Acetaminophen Nausea And Vomiting    Other reaction(s): Vomiting   Ranolazine Anaphylaxis  Patient describes abdominal swelling/bloating that radiates to neck/throat Patient describes abdominal swelling/bloating that radiates to neck/throat    Rivaroxaban Rash    Other reaction(s): Other (See Comments), Other - See Comments SEVERE RECTAL  BLEED Sever rectal bleeding Sever rectal bleeding SEVERE RECTAL BLEED    Ezetimibe Diarrhea    Other reaction(s): cough, Other - See Comments Cough Cough    Pregabalin Other (See Comments)    "weight gain"  Weight gain   Apixaban    Dabigatran Etexilate Mesilate [Dabigatran]    Dabigatran Etexilate Mesylate Other (See Comments)   Dilaudid [Hydromorphone Hcl]    Fentanyl     Nauseated and makes pt jittery   Nauseated and makes pt jittery   Nitroglycerin    Other     Other reaction(s): Hives, Itching Pt states he breaks out in red bumps with adhesive materials (ex: patches)   Prednisone     Doesn't recall reaction but comfortable trying decadron inpatient while monitored. MD aware.    Tramadol     Family History  Problem Relation Age of Onset   Diabetes Mother    CAD Mother    Hypertension Mother    CAD Father    Hyperlipidemia Father    Hypertension Father     Prior to Admission medications   Medication Sig Start Date End Date Taking? Authorizing Provider  acyclovir 760 mg in dextrose 5 % 250 mL Inject 760 mg into the vein every 8 (eight) hours. 01/29/22   Leatha Gilding, MD  albuterol (VENTOLIN HFA) 108 (90 Base) MCG/ACT inhaler Inhale 2 puffs into the lungs every 4 (four) hours as needed. 05/09/21   [provider]  amLODipine (NORVASC) 2.5 MG tablet Take 2.5 mg by mouth 2 (two) times daily. 07/26/21   [provider]  ascorbic acid (VITAMIN C) 250 MG tablet Take 250 mg by mouth daily. 11/25/19   [provider]  atorvastatin (LIPITOR) 80 MG tablet Take 80 mg by mouth daily.    [provider]  cholecalciferol (VITAMIN D3) 10 MCG (400 UNIT) TABS tablet Take 400 Units by mouth daily.    [provider]  clopidogrel (PLAVIX) 75 MG tablet Take 75 mg by mouth daily. 04/29/19   [provider]  cyanocobalamin (VITAMIN B12) 1000 MCG tablet Take 1,000 mcg by mouth daily. 11/17/18   [provider]  dexamethasone  (DECADRON) 10 MG/ML injection Inject 0.8 mLs (8 mg total) into the vein daily. 01/29/22   Leatha Gilding, MD  finasteride (PROSCAR) 5 MG tablet Take 5 mg by mouth daily. 05/20/21   [provider]  gabapentin (NEURONTIN) 800 MG tablet Take 800 mg by mouth 3 (three) times daily. Take 800 mg by mouth in the morning, at noon, and at bedtime.    [provider]  HYDROcodone-acetaminophen (HYCET) 7.5-325 mg/15 ml solution Take 7.5 mLs by mouth as directed. 12/28/21   [provider]  ipratropium-albuterol (DUONEB) 0.5-2.5 (3) MG/3ML SOLN Take 3 mLs by nebulization every 6 (six) hours. 05/07/21   [provider]  lisinopril (ZESTRIL) 2.5 MG tablet Take 2.5 mg by mouth daily. 05/31/21   [provider]  magnesium oxide (MAG-OX) 400 (240 Mg) MG tablet Take 400 mg by mouth daily.    [provider]  metoprolol succinate (TOPROL-XL) 100 MG 24 hr tablet Take 100 mg by mouth daily.    [provider]  nicotine polacrilex (NICORETTE) 4 MG gum Take 4 mg by mouth as needed for smoking cessation.  [provider]  pantoprazole (PROTONIX) 40 MG tablet Take 40 mg by mouth 2 (two) times daily.    [provider]  potassium chloride SA (KLOR-CON M20) 20 MEQ tablet Take 20 mEq by mouth daily. 11/25/19   [provider]  tadalafil (CIALIS) 20 MG tablet Take 20 mg by mouth daily as needed for erectile dysfunction. 11/19/21   [provider]  tamsulosin (FLOMAX) 0.4 MG CAPS capsule Take 0.4 mg by mouth daily. 05/30/21   [provider]  warfarin (COUMADIN) 7.5 MG tablet Take 7.5 mg by mouth daily.    [provider]  zolpidem (AMBIEN) 10 MG tablet Take 10 mg by mouth at bedtime.    [provider]    Physical Exam:  Vitals:   01/31/22 0459 01/31/22 0759 01/31/22 0910 01/31/22 1521  BP:  133/77  108/68  Pulse:  61 60 66  Resp:  20 18 18   Temp:  98 F (36.7 C)  98.2 F (36.8 C)  TempSrc:   Oral    SpO2:  97% 97% (!) 88%  Weight: 93.9 kg     Height: 5\' 6"  (1.676 m)       Constitutional: Awake alert and oriented x3, patient is in distress due to left-sided headache. Skin: no rashes, no lesions, good skin turgor noted. Eyes: Pupils are what constricted but bilaterally reactive to light.  No evidence of scleral icterus or conjunctival pallor.  ENMT: Moist mucous membranes noted.  Posterior pharynx clear of any exudate or lesions.   Neck: normal, supple, no masses, no thyromegaly.  No evidence of jugular venous distension.   Respiratory: clear to auscultation bilaterally, no wheezing, no crackles. Normal respiratory effort. No accessory muscle use.  Cardiovascular: Regular rate and rhythm, no murmurs / rubs / gallops. No extremity edema. 2+ pedal pulses. No carotid bruits.  Chest:   Nontender without crepitus or deformity.   Back:   Nontender without crepitus or deformity. Abdomen: Abdomen is soft and nontender.  No evidence of intra-abdominal masses.  Positive bowel sounds noted in all quadrants.   Musculoskeletal: No joint deformity upper and lower extremities. Good ROM, no contractures. Normal muscle tone.  Neurologic: CN 2-12 grossly intact. Sensation intact.  Patient moving all 4 extremities spontaneously.  Patient is following all commands.  Patient is responsive to verbal stimuli.   Psychiatric: Patient exhibits anxious mood with appropriate affect.  Patient seems to possess insight as to their current situation.    Data Reviewed:  I have personally reviewed and interpreted labs, imaging.  Significant findings are   Lab Results  Component Value Date   WBC 12.9 (H) 01/31/2022   HGB 12.3 (L) 01/31/2022   HCT 37.4 (L) 01/31/2022   MCV 82.7 01/31/2022   PLT 192 01/31/2022   Lab Results  Component Value Date   K 4.4 01/31/2022   Lab Results  Component Value Date   BUN 19 01/31/2022   Lab Results  Component Value Date   CREATININE 1.19 01/31/2022       Assessment and Plan: Left temporal headache Patient reports marginal improvement but overall is still complaining of severe left-sided headaches and blurry vision We will proceed with attempting to obtain MRI of the brain with and without contrast with MRI of the orbits per neurology recommendations via the MRI conditional protocol Continuing acyclovir Continuing Decadron Case discussed with Dr. Rory Percy with neurology who is recommending continued intravenous acyclovir with oral Decadron Unfortunately, after further review the MRI department tells [place] that  the patient's cardiac device is not MRI safe and therefore we cannot proceed with the MRI of the brain and orbits as originally planned. Will page ophthalmology here and discuss case to identify whether they would be able to evaluate the patient at the bedside while hospitalized versus seeing the patient in the outpatient setting.   Paroxysmal atrial fibrillation with rapid ventricular response (HCC) Longstanding history of paroxysmal atrial fibrillation Status post ablation in 2013. Rapid atrial fibrillation earlier in the hospitalization has resolved and patient has remained rate control for at least 48 hours therefore telemetry is not indicated Back on scheduled oral metoprolol Patient is typically on Coumadin, last INR 3.4.  Pharmacy consultation for dosing.  Bright red blood per rectum Patient reports ongoing bouts of bright red blood per rectum, typically on toilet paper. There has been no recurrence and hemoglobin and hematocrit have remained stable Documented history of small bowel AVMs in the past If no recurrence and H&H remained stable then outpatient follow-up.  If this recurs will consult gastroenterology  Coronary artery disease involving native coronary artery of native heart History of multiple stents in the past with most recent cardiac catheterization 09/20/2021 at [facility] Patient is currently chest pain  free Continue home regimen of antiplatelet therap with Plavix, lipid-lowering therapy with Lipitor and metoprolol.    Uncontrolled type 2 diabetes mellitus with hyperglycemia, without long-term current use of insulin (HCC) Markedly elevated sugars in the setting of systemic steroids Accu-Cheks before every meal and nightly with sliding scale insulin Obtaining hemoglobin A1c Basal insulin therapy or increasing intensity sliding scale may need to be ordered if hyperglycemia persists.  Chronic diastolic CHF (congestive heart failure) (HCC) No clinical evidence of cardiogenic volume overload Strict input and output monitoring Daily weights Low-sodium diet   Essential hypertension Resume patients home regimen of oral antihypertensives including metoprolol and Norvasc Titrate antihypertensive regimen as necessary to achieve adequate BP control PRN intravenous antihypertensives for excessively elevated blood pressure   Mixed diabetic hyperlipidemia associated with type 2 diabetes mellitus ([place]) Continuing home regimen of lipid lowering therapy.   Automatic implantable cardioverter-defibrillator in situ Information obtained from [facility] cardiology note 10/21/2021 Original defibrillator placement 2003 Multiple replacement since with replacement 12/2008 Patient then was diagnosed with cardiac sarcoid status post [facility] dual ICD placement.  Exact date of placement is unclear with both 01/13/2020 and 06/16/2020 both in last cardiology note.   Now seems to follow-up with [facility] cardiology/electrophysiology last documented visit 10/2021.       Code Status:  Full code  code status decision has been confirmed with: patient    Consults: Dr. Rory Percy with Neurology.  Dr. Ellie Lunch with Opthalmology  Severity of Illness:  The appropriate patient status for this patient is INPATIENT. Inpatient status is judged to be reasonable and necessary in order to provide the required intensity of service to ensure  the patient's safety. The patient's presenting symptoms, physical exam findings, and initial radiographic and laboratory data in the context of their chronic comorbidities is felt to place them at high risk for further clinical deterioration. Furthermore, it is not anticipated that the patient will be medically stable for discharge from the hospital within 2 midnights of admission.   * I certify that at the point of admission it is my clinical judgment that the patient will require inpatient hospital care spanning beyond 2 midnights from the point of admission due to high intensity of service, high risk for further deterioration and high frequency of surveillance required.*  Author:  Vernelle Emerald MD  01/31/2022 3:32 PM

## 2022-01-31 NOTE — Progress Notes (Signed)
[facility] arrival for transport. Pt received pain medication at 0234.

## 2022-01-31 NOTE — Assessment & Plan Note (Addendum)
Patient reports ongoing bouts of bright red blood per rectum, typically on toilet paper. There has been no recurrence and hemoglobin and hematocrit have remained stable Documented history of small bowel AVMs in the past If no recurrence and H&H remained stable then outpatient follow-up.  If this recurs will consult gastroenterology

## 2022-01-31 NOTE — Progress Notes (Signed)
Report called to [facility]. [facility] notified by charge Nurse Durwin Reges. Pt notified of transfer awaiting transport.

## 2022-01-31 NOTE — TOC Transition Note (Signed)
Transition of Care [facility]) - CM/SW Discharge Note   Patient Details  Name: [name] MRN: [id number] Date of Birth: Mar 22, 1955  [facility] CM/SW Contact:  Kermit Balo, RN Phone Number: 01/31/2022, 1:13 PM   Clinical Narrative:    Pt is from home with his father. Pt does provide care for this dad.  Pt drives self but brother can provide transportation if needed. Pt manages his own medications and denies any issues.  No DME.  Cm provided bedside RN with cab voucher to get pt back to [facility] once d/ced. Pts truck is parked at [facility].    Final next level of care: Home/Self Care Barriers to Discharge: No Barriers Identified   Patient Goals and CMS Choice      Discharge Placement                         Discharge Plan and Services Additional resources added to the After Visit Summary for                                       Social Determinants of Health (SDOH) Interventions SDOH Screenings   Food Insecurity: No Food Insecurity (01/31/2022)  Housing: Low Risk  (01/31/2022)  Recent Concern: Housing - Medium Risk (01/25/2022)  Transportation Needs: No Transportation Needs (01/31/2022)  Utilities: Not At Risk (01/31/2022)  Tobacco Use: Medium Risk (01/31/2022)     Readmission Risk Interventions     No data to display

## 2022-01-31 NOTE — Assessment & Plan Note (Signed)
Resume patients home regimen of oral antihypertensives including metoprolol and Norvasc Titrate antihypertensive regimen as necessary to achieve adequate BP control PRN intravenous antihypertensives for excessively elevated blood pressure

## 2022-01-31 NOTE — Consult Note (Signed)
Reason for consult:  left sided headache and blurred vision.   HPI: [name] is an 66 y.o. male.  He started having headache about 8 days ago.  Pain in Left scalp and temple.  He felt like the vision is been a "Little bit foggy" OS.  He went to [facility] and was admitted for a few days.  Per the hospitalist who called me, ophthalmology at [facility] refused to see the patient and recommended IV Valtrex in case it was Zoster.  Also, (per hospitalist) neurology at [facility] wanted an MRI, so patient was transferred to [facility] today.  Patient cannot get an MRI due to pacemaker/defibrillator.  Headache is about the same as onset. "Maybe a little better."  +Pain with squeezing his eyes.  He is says both sides of head are involved, but worse on the left side.  He has to "squeech" (He squints his eyes to demonstrate) his eyes to see up close.  (He hasn't tired readers.)  Vision gets double when he is tired.  POH:  He reports eye muscle surgery in young adulthood and again about 20 years ago for strabismus and diplopia  Past Medical History:  Diagnosis Date   Atrial fibrillation ([place])    CHF (congestive heart failure) ([place])    Diabetes mellitus without complication ([place])    Factor 5 Leiden mutation, heterozygous ([place])    Hypertension    Pacemaker    Sarcoidosis    Past Surgical History:  Procedure Laterality Date   CARDIAC SURGERY     HERNIA REPAIR     TRANSURETHRAL RESECTION OF PROSTATE     Family History  Problem Relation Age of Onset   Diabetes Mother    CAD Mother    Hypertension Mother    CAD Father    Hyperlipidemia Father    Hypertension Father    Current Facility-Administered Medications  Medication Dose Route Frequency Provider Last Rate Last Admin   0.9 %  sodium chloride infusion   Intravenous Continuous Shalhoub, Sherryll Burger, MD       acyclovir (ZOVIRAX) 760 mg in dextrose 5 % 250 mL IVPB  760 mg Intravenous Q8H Crosley, Debby, MD 265.2 mL/hr at 01/31/22 1433 760 mg at  01/31/22 1433   albuterol (PROVENTIL) (2.5 MG/3ML) 0.083% nebulizer solution 2.5 mg  2.5 mg Inhalation Q4H PRN Crosley, Debby, MD       amLODipine (NORVASC) tablet 2.5 mg  2.5 mg Oral BID Crosley, Debby, MD   2.5 mg at 01/31/22 [id number]   ascorbic acid (VITAMIN C) tablet 500 mg  500 mg Oral Daily Crosley, Debby, MD   500 mg at 01/31/22 0927   atorvastatin (LIPITOR) tablet 80 mg  80 mg Oral Daily Crosley, Debby, MD   80 mg at 01/31/22 [id number]   clopidogrel (PLAVIX) tablet 75 mg  75 mg Oral Daily Crosley, Debby, MD   75 mg at 01/31/22 [id number]   cyanocobalamin (VITAMIN B12) tablet 1,000 mcg  1,000 mcg Oral Daily Crosley, Debby, MD   1,000 mcg at 01/31/22 0927   dexamethasone (DECADRON) injection 8 mg  8 mg Intravenous Q24H Crosley, Debby, MD   8 mg at 01/31/22 0928   finasteride (PROSCAR) tablet 5 mg  5 mg Oral Daily Crosley, Debby, MD   5 mg at 01/31/22 0927   gabapentin (NEURONTIN) capsule 800 mg  800 mg Oral TID Quintella Baton, MD   800 mg at 01/31/22 1219   hydrALAZINE (APRESOLINE) injection 10 mg  10 mg Intravenous Q6H PRN  Shalhoub, Sherryll Burger, MD       HYDROcodone-acetaminophen (HYCET) 7.5-325 mg/15 ml solution 10 mL  10 mL Oral Q6H PRN Crosley, Debby, MD   10 mL at 01/31/22 1219   insulin aspart (novoLOG) injection 0-15 Units  0-15 Units Subcutaneous TID AC & HS Shalhoub, Sherryll Burger, MD       lisinopril (ZESTRIL) tablet 2.5 mg  2.5 mg Oral Daily Crosley, Debby, MD   2.5 mg at 01/31/22 0928   magnesium oxide (MAG-OX) tablet 400 mg  400 mg Oral Daily Crosley, Debby, MD   400 mg at 01/31/22 [id number]   metoprolol succinate (TOPROL-XL) 24 hr tablet 100 mg  100 mg Oral Daily Crosley, Debby, MD   100 mg at 01/31/22 [id number]   morphine (PF) 2 MG/ML injection 2 mg  2 mg Intravenous Q4H PRN Shalhoub, Sherryll Burger, MD       nicotine polacrilex (NICORETTE) gum 4 mg  4 mg Oral PRN Claria Dice, Debby, MD   4 mg at 01/31/22 1438   pantoprazole (PROTONIX) EC tablet 40 mg  40 mg Oral BID Crosley, Debby, MD   40 mg at 01/31/22 0927    potassium chloride SA (KLOR-CON M) CR tablet 20 mEq  20 mEq Oral Daily Crosley, Debby, MD   20 mEq at 01/31/22 0927   tamsulosin (FLOMAX) capsule 0.4 mg  0.4 mg Oral Daily Crosley, Debby, MD   0.4 mg at 01/31/22 [id number]   warfarin (COUMADIN) tablet 4 mg  4 mg Oral ONCE-1600 [place], Donalynn Furlong, [facility]       Warfarin - Pharmacist Dosing Inpatient   Does not apply [id number], [facility]       Allergies  Allergen Reactions   Celecoxib Other (See Comments)    Other reaction(s): GI Bleed, Other - See Comments GI BLEEDING Other Reaction: GI BLEEDING GI bleeding    Ciprofloxacin Anaphylaxis and Other (See Comments)    Other reaction(s): Other - See Comments Pt went into At Fib Other Reaction: PUT PT IN A FIB    Dicyclomine     Other reaction(s): chest pain   Hydromorphone Nausea And Vomiting    Other reaction(s): Vomiting  Nausea and vomiting   Oxycodone-Acetaminophen Nausea And Vomiting    Other reaction(s): Vomiting   Ranolazine Anaphylaxis    Patient describes abdominal swelling/bloating that radiates to neck/throat Patient describes abdominal swelling/bloating that radiates to neck/throat    Rivaroxaban Rash    Other reaction(s): Other (See Comments), Other - See Comments SEVERE RECTAL BLEED Sever rectal bleeding Sever rectal bleeding SEVERE RECTAL BLEED    Ezetimibe Diarrhea    Other reaction(s): cough, Other - See Comments Cough Cough    Pregabalin Other (See Comments)    "weight gain"  Weight gain   Apixaban    Dabigatran Etexilate Mesilate [Dabigatran]    Dabigatran Etexilate Mesylate Other (See Comments)   Dilaudid [Hydromorphone Hcl]    Fentanyl     Nauseated and makes pt jittery   Nauseated and makes pt jittery   Nitroglycerin    Other     Other reaction(s): Hives, Itching Pt states he breaks out in red bumps with adhesive materials (ex: patches)   Prednisone     Doesn't recall reaction but comfortable trying decadron inpatient while monitored. MD aware.     Tramadol    Social History   Socioeconomic History   Marital status: Single    Spouse name: Not on file   Number of children: Not on file  Years of education: Not on file   Highest education level: Not on file  Occupational History   Not on file  Tobacco Use   Smoking status: Former   Smokeless tobacco: Never  Vaping Use   Vaping Use: Never used  Substance and Sexual Activity   Alcohol use: No   Drug use: No   Sexual activity: Not on file  Other Topics Concern   Not on file  Social History Narrative   Not on file   Social Determinants of Health   Financial Resource Strain: Not on file  Food Insecurity: No Food Insecurity (01/31/2022)   Hunger Vital Sign    Worried About Running Out of Food in the Last Year: Never true    Ran Out of Food in the Last Year: Never true  Transportation Needs: No Transportation Needs (01/31/2022)   PRAPARE - Hydrologist (Medical): No    Lack of Transportation (Non-Medical): No  Physical Activity: Not on file  Stress: Not on file  Social Connections: Not on file  Intimate Partner Violence: Not At Risk (01/31/2022)   Humiliation, Afraid, Rape, and Kick questionnaire    Fear of Current or Ex-Partner: No    Emotionally Abused: No    Physically Abused: No    Sexually Abused: No    Review of systems: ROS: +HA,  Physical Exam:  Blood pressure 108/68, pulse 66, temperature 98.2 F (36.8 C), resp. rate 18, height 5\' 6"  (1.676 m), weight 93.9 kg, SpO2 (!) 88 %.   VA cc (OTC rdrs that I brought):  OD 20/20 OS  20/20  (Closer focal point OS than OD) BUT I DID DOUBLE CHECK AND HIS VISION IS 20/20 IN EACH EYE WITH READING GLASSES AT NEAR  Pupils:   OD round, reactive to light, no APD            OS round, reactive to light, no APD  IOP (T pen)  OD:19 OS: 20  CVF: OD full to CF   OS full to CF  Motility:  extorsion in upgaze OU  Balance/alignment:  XT at distance and near   bedside examination:  He is  sitting in bed and eating.  In no apparent distress                                 OD                                       External/adnexa: Normal                                      Lids/lashes:        Normal                                      Conjunctiva        White, quiet        Cornea:              Clear                  AC:  Deep, no hypopyon                                Iris:                     Normal        Lens:                  mild NS                                     OS                                       External/adnexa: Normal         THERE IS NO RASH ON HIS FACE OR SCALP.  SKIN NORMAL                             Lids/lashes:        Normal                                      Conjunctiva        White, quiet, NO INJECTION       Cornea:              Clear                  AC:                     Deep, no hypopyon                                Iris:                     Normal        Lens:                  mild NS      Dilated fundus exam: (Neo 2.5; Myd 1%)      OD Vitreous            Clear, quiet                                Optic Disc:       Normal, perfused                      Macula:             Flat                                            Vessels:           Normal caliber,distribution         Periphery:         Flat, attached  OS Vitreous            Clear, quiet                                Optic Disc:       Normal, perfused                      Macula:             Flat                                            Vessels:           Normal caliber,distribution         Periphery:         Flat, attached        Labs/studies: Results for orders placed or performed during the hospital encounter of 01/31/22 (from the past 48 hour(s))  CBC with Differential/Platelet     Status: Abnormal   Collection Time: 01/31/22  6:51 AM  Result Value Ref Range   WBC 12.9 (H) 4.0 - 10.5 K/uL   RBC 4.52  4.22 - 5.81 MIL/uL   Hemoglobin 12.3 (L) 13.0 - 17.0 g/dL   HCT 37.4 (L) 39.0 - 52.0 %   MCV 82.7 80.0 - 100.0 fL   MCH 27.2 26.0 - 34.0 pg   MCHC 32.9 30.0 - 36.0 g/dL   RDW 14.5 11.5 - 15.5 %   Platelets 192 150 - 400 K/uL   nRBC 0.0 0.0 - 0.2 %   Neutrophils Relative % 85 %   Neutro Abs 11.0 (H) 1.7 - 7.7 K/uL   Lymphocytes Relative 8 %   Lymphs Abs 1.1 0.7 - 4.0 K/uL   Monocytes Relative 6 %   Monocytes Absolute 0.8 0.1 - 1.0 K/uL   Eosinophils Relative 0 %   Eosinophils Absolute 0.0 0.0 - 0.5 K/uL   Basophils Relative 0 %   Basophils Absolute 0.0 0.0 - 0.1 K/uL   Immature Granulocytes 1 %   Abs Immature Granulocytes 0.09 (H) 0.00 - 0.07 K/uL    Comment: Performed at [facility], [address], [place],  [id number]  Basic metabolic panel     Status: Abnormal   Collection Time: 01/31/22  6:51 AM  Result Value Ref Range   Sodium 131 (L) 135 - 145 mmol/L   Potassium 4.4 3.5 - 5.1 mmol/L   Chloride 99 98 - 111 mmol/L   CO2 24 22 - 32 mmol/L   Glucose, Bld 395 (H) 70 - 99 mg/dL    Comment: Glucose reference range applies only to samples taken after fasting for at least 8 hours.   BUN 19 8 - 23 mg/dL   Creatinine, Ser 1.19 0.61 - 1.24 mg/dL   Calcium 8.6 (L) 8.9 - 10.3 mg/dL   GFR, Estimated >60 >60 mL/min    Comment: (NOTE) Calculated using the CKD-EPI Creatinine Equation (2021)    Anion gap 8 5 - 15    Comment: Performed at [facility] [address], [place],  [address]  Hemoglobin A1c     Status: Abnormal   Collection Time: 01/31/22  6:51 AM  Result Value Ref Range   Hgb A1c MFr Bld 5.7 (H) 4.8 - 5.6 %  Comment: (NOTE) Pre diabetes:          5.7%-6.4%  Diabetes:              >6.4%  Glycemic control for   <7.0% adults with diabetes    Mean Plasma Glucose 116.89 mg/dL    Comment: Performed at [facility], [address], [place], [place] [address]  Protime-INR     Status: Abnormal   Collection Time: 01/31/22  2:36 PM   Result Value Ref Range   Prothrombin Time 30.1 (H) 11.4 - 15.2 seconds   INR 2.9 (H) 0.8 - 1.2    Comment: (NOTE) INR goal varies based on device and disease states. Performed at [facility], [address] [place], [place] [address]    No results found.                           Assessment and Plan:  OVERALL, THIS IS A NORMAL EYE EXAM  Long standing exotropia (this means his eyes drift outwards).  He has had strabismus twice.  Last time was 20 years ago.  Patients with uncorrected strabismus often squint to prevent diplopia.  (This explains his complaint of needing to "squeech" his eyes.)  Spectacles with prism or repeat strabismus surgery could help -- need outpatient eye exam.  Not urgent.  He has my card.  He can called me after discharge.  Or he can follow up with an ophthalmologist close to home Mild cataracts Presbyopia.  This is a normal part of aging. Get some reading glasses, so will not have to squint so much Headache.  Not related to his eyes.  No sign of Zoster.  CRP slightly elevated, which raises a slight concern for Giant cell arteritis.  Recommend work-up as per neurology.  Again, not eye related   All of the above information was relayed to the patient.  Call me if eyes gets red or other OPHTHALMIC complaints.  Follow up contact information was provided.  All questions were answered.   Stephens Shreve L 01/31/2022, 4:28 PM  [facility] Ophthalmology [phone]

## 2022-01-31 NOTE — Assessment & Plan Note (Addendum)
Longstanding history of paroxysmal atrial fibrillation Status post ablation in 2013. Rapid atrial fibrillation earlier in the hospitalization has resolved and patient has remained rate control for at least 48 hours therefore telemetry is not indicated Back on scheduled oral metoprolol Patient is typically on Coumadin, last INR 3.4.  Pharmacy consultation for dosing.

## 2022-01-31 NOTE — Consult Note (Signed)
Neurology Consultation    Reason for Consult:L head tenderness, L eye pain, and blurry vision on the L   CC: headache, blurry vision   HISTORY OF PRESENT ILLNESS   HPI  [name] is a 66 y.o. male with a past medical history of DM2, hx of ocular muscle surgery 20 years ago for strabismus and diplopia, CAD s/p multiple stents on plavix, hx TIA or CVA, a fib and factor V Leiden mutation, hypertension, pacemaker, sarcoidosis who initially presented to [facility] on 12/22 with tenderness in his left head, left eye pain, and blurry vision on the left.  Symptoms initially started 12/18 when he woke up with burning in his left eye.  On admission he reported tenderness around the left orbit and more superiorly to his proximately 60% of the way back across the top of his head as well as pain with downward eye movement. There is approximately a 1 cm patch of erythema above his temple.  No vesicles.  He has a history of chickenpox infection and has received his shingles vaccination.   He has an AICD initially placed in 2010 s/p generator replacement at [facility] in 2021 with 4 lead revisions.  It has been determined that his leads are not MRI compatible.  CT head showed no acute finding or explanation for the symptoms.  CTA head and neck showed no hemodynamically significant stenosis.  CT abdomen pelvis showed no acute findings. CRP 1. ESR 5.  He is still endorsing a left temporal headache and blurry vision off and on.  Denies any sudden loss of vision or threatening vision loss or eye going black.  Denies jaw claudication.  Reports palpable tenderness on the temple.  The patient denies dizziness, problems with swallowing or speech, focal muscle weakness, numbness or tingling of her extremities, abnormal movements, or other focal neurological deficits.   ROS: Full ROS was performed and is negative except as noted in the HPI.   PAST MEDICAL HISTORY    Past Medical History:  Past Medical History:  Diagnosis  Date   Atrial fibrillation ([place])    CHF (congestive heart failure) ([place])    Diabetes mellitus without complication ([place])    Factor 5 Leiden mutation, heterozygous ([place])    Hypertension    Pacemaker    Sarcoidosis     No family history on file. Family History  Problem Relation Age of Onset   Diabetes Mother    CAD Mother    Hypertension Mother    CAD Father    Hyperlipidemia Father    Hypertension Father     Allergies:  Allergies  Allergen Reactions   Celecoxib Other (See Comments)    Other reaction(s): GI Bleed, Other - See Comments GI BLEEDING Other Reaction: GI BLEEDING GI bleeding    Ciprofloxacin Anaphylaxis and Other (See Comments)    Other reaction(s): Other - See Comments Pt went into At Fib Other Reaction: PUT PT IN A FIB    Dicyclomine     Other reaction(s): chest pain   Hydromorphone Nausea And Vomiting    Other reaction(s): Vomiting  Nausea and vomiting   Oxycodone-Acetaminophen Nausea And Vomiting    Other reaction(s): Vomiting   Ranolazine Anaphylaxis    Patient describes abdominal swelling/bloating that radiates to neck/throat Patient describes abdominal swelling/bloating that radiates to neck/throat    Rivaroxaban Rash    Other reaction(s): Other (See Comments), Other - See Comments SEVERE RECTAL BLEED Sever rectal bleeding Sever rectal bleeding SEVERE RECTAL BLEED  Ezetimibe Diarrhea    Other reaction(s): cough, Other - See Comments Cough Cough    Pregabalin Other (See Comments)    "weight gain"  Weight gain   Apixaban    Dabigatran Etexilate Mesilate [Dabigatran]    Dabigatran Etexilate Mesylate Other (See Comments)   Dilaudid [Hydromorphone Hcl]    Fentanyl     Nauseated and makes pt jittery   Nauseated and makes pt jittery   Nitroglycerin    Other     Other reaction(s): Hives, Itching Pt states he breaks out in red bumps with adhesive materials (ex: patches)   Prednisone     Doesn't recall reaction but comfortable trying  decadron inpatient while monitored. MD aware.    Tramadol     Social History:   reports that he has quit smoking. He has never used smokeless tobacco. He reports that he does not drink alcohol and does not use drugs.    Medications Medications Prior to Admission  Medication Sig Dispense Refill   acyclovir 760 mg in dextrose 5 % 250 mL Inject 760 mg into the vein every 8 (eight) hours.     albuterol (VENTOLIN HFA) 108 (90 Base) MCG/ACT inhaler Inhale 2 puffs into the lungs every 4 (four) hours as needed.     amLODipine (NORVASC) 2.5 MG tablet Take 2.5 mg by mouth 2 (two) times daily.     ascorbic acid (VITAMIN C) 250 MG tablet Take 250 mg by mouth daily.     atorvastatin (LIPITOR) 80 MG tablet Take 80 mg by mouth daily.     cholecalciferol (VITAMIN D3) 10 MCG (400 UNIT) TABS tablet Take 400 Units by mouth daily.     clopidogrel (PLAVIX) 75 MG tablet Take 75 mg by mouth daily.     cyanocobalamin (VITAMIN B12) 1000 MCG tablet Take 1,000 mcg by mouth daily.     dexamethasone (DECADRON) 10 MG/ML injection Inject 0.8 mLs (8 mg total) into the vein daily. 1 mL 0   finasteride (PROSCAR) 5 MG tablet Take 5 mg by mouth daily.     gabapentin (NEURONTIN) 800 MG tablet Take 800 mg by mouth 3 (three) times daily. Take 800 mg by mouth in the morning, at noon, and at bedtime.     HYDROcodone-acetaminophen (HYCET) 7.5-325 mg/15 ml solution Take 7.5 mLs by mouth as directed.     ipratropium-albuterol (DUONEB) 0.5-2.5 (3) MG/3ML SOLN Take 3 mLs by nebulization every 6 (six) hours.     lisinopril (ZESTRIL) 2.5 MG tablet Take 2.5 mg by mouth daily.     magnesium oxide (MAG-OX) 400 (240 Mg) MG tablet Take 400 mg by mouth daily.     metoprolol succinate (TOPROL-XL) 100 MG 24 hr tablet Take 100 mg by mouth daily.     nicotine polacrilex (NICORETTE) 4 MG gum Take 4 mg by mouth as needed for smoking cessation.     pantoprazole (PROTONIX) 40 MG tablet Take 40 mg by mouth 2 (two) times daily.     potassium chloride  SA (KLOR-CON M20) 20 MEQ tablet Take 20 mEq by mouth daily.     tadalafil (CIALIS) 20 MG tablet Take 20 mg by mouth daily as needed for erectile dysfunction.     tamsulosin (FLOMAX) 0.4 MG CAPS capsule Take 0.4 mg by mouth daily.     warfarin (COUMADIN) 7.5 MG tablet Take 7.5 mg by mouth daily.     zolpidem (AMBIEN) 10 MG tablet Take 10 mg by mouth at bedtime.      EXAMINATION  Current vital signs:    01/31/2022    3:21 PM 01/31/2022    9:10 AM 01/31/2022    7:59 AM  Vitals with BMI  Systolic 762  831  Diastolic 68  77  Pulse 66 60 61    Examination:  GENERAL: Awake, alert and sitting on the side of the bed in NAD HEENT: - Normocephalic and atraumatic, dry mm, no lymphadenopathy, no Thyromegally LUNGS - Clear to auscultation bilaterally CV - S1S2 RRR, equal pulses bilaterally. ABDOMEN - Soft, nontender, nondistended with normoactive BS Ext: warm, well perfused, intact peripheral pulses, no pedal edema  NEURO:  Mental Status: AA&Ox3  Language: speech is clear.  Intact naming, repetition, fluency, and comprehension. No aphasia, dysarthria or neglect noted Cranial Nerves:  II: PERRL. Visual fields full to confrontation.  III, IV, VI: EOM in tact. Eyelids elevate symmetrically. Blinks to threat.  V: Sensation intact V1-3 symmetrically, endorses tenderness in left V1 VII: no facial asymmetry   VIII: hearing intact to voice IX, X: Palate elevates symmetrically. Phonation is normal.  DV:VOHYWVPX shrug 5/5 and symmetrical  XII: tongue is midline without fasciculations. Motor:  5/5 strength in all extremities Tone: is normal and bulk is normal Sensation- Intact to light touch bilaterally Coordination: No gross ataxia noted Gait-steady   LABS   I have reviewed labs in epic and the results pertinent to this consultation are: Lab Results  Component Value Date   LDLCALC 65 08/01/2021   Lab Results  Component Value Date   ALT 16 (L) 01/23/2016   AST 30 01/23/2016    ALKPHOS 67 01/23/2016   BILITOT 0.7 01/23/2016   Lab Results  Component Value Date   HGBA1C 5.7 (H) 01/31/2022   Lab Results  Component Value Date   WBC 12.9 (H) 01/31/2022   HGB 12.3 (L) 01/31/2022   HCT 37.4 (L) 01/31/2022   MCV 82.7 01/31/2022   PLT 192 01/31/2022   Lab Results  Component Value Date   VITAMINB12 1,009 (H) 01/24/2022   Lab Results  Component Value Date   FOLATE 8.2 01/24/2022   Lab Results  Component Value Date   NA 131 (L) 01/31/2022   K 4.4 01/31/2022   CL 99 01/31/2022   CO2 24 01/31/2022     DIAGNOSTIC IMAGING/PROCEDURES   I have reviewed the images obtained:, as below    CT-head No acute finding  CTA head and neck  Atheromatous change about the carotid siphons with up to moderate stenoses bilaterally. Mild atheromatous irregularity elsewhere about the major arterial vasculature of the head and neck. No other hemodynamically significant or correctable stenosis.  ASSESSMENT/PLAN    Assessment:  66 year old past history of diabetes coronary artery disease history of TIA A-fib factor V Leiden mutation on anticoagulation hypertension pacemaker placement with revision with multiple day history of left retro-orbital and scalp pain.  No obvious red flags for giant cell arteritis including normal inflammatory markers-CRP is 1.1, ESR is 5. There was concern for ophthalmic herpes zoster for which she has been on acyclovir.  Ophthalmologic evaluation has been completed and there is no ophthalmic lesions. I suspect that he has either migraines or some other kind headache. History and exam as well as inflammatory markers do not support giant cell arteritis although it was mentioned to be considered by the ophthalmologic evaluation. That said, also the risk of subjecting him to a biopsy which also has not final be all and all test also involves risks.  Empirically treating him with steroids, with his  history of diabetes also neurologist. I would recommend  completing his 7 days of acyclovir and sending him out on a steroid taper to follow with a headache specialist for what sounds more like migraines but does not seem concerning for GCA  Recommendations: - Continue acyclovir IV 10 mg/kg q 8 hrs + dexamethasone for total of 7 days (12/24-12/30) -Has been on Decadron for 7 to 8 days.  Would recommend t discontinue it but tapering dose. -Referral to the headache clinic I discussed preliminary plan with the admitting hospitalist but I will reiterate my plan tomorrow with the hospitalist taking over for the patient and also discussed this with the patient in detail.  If there are any changes, I will make you aware of those tomorrow  -- Patient seen and examined by NP/APP with MD. MD to update note as needed.   Janine Ores, DNP, FNP-BC [facility] Pager: [phone]   **This documentation was dictated using [employer]father and may contain inadvertent errors **   Attending Neurohospitalist Addendum Patient seen and examined with APP/Resident. Agree with the history and physical as documented above. Agree with the plan as documented, which I helped formulate. I have independently reviewed the chart, obtained history, review of systems and examined the patient.I have personally reviewed pertinent head/neck/spine imaging (CT/MRI).  Reviewed the above and have made the changes in the assessment and plan   Please feel free to call with any questions.  -- Amie Portland, MD Neurologist [facility] Pager: [phone]

## 2022-01-31 NOTE — Assessment & Plan Note (Signed)
.   Continuing home regimen of lipid lowering therapy.  

## 2022-01-31 NOTE — Hospital Course (Addendum)
66 year old male with past medical history of coronary artery disease status post numberous stents (last cardiac catheterization 09/2021 at [facility]), cardiac sarcoidosis S/P ICD/pacemaker placement [facility]. Jude Model [place] [id number], MRI conditional), atrial fibrillation, hypertension, diabetes mellitus type 2, diastolic congestive heart failure, hyperlipidemia, factor V Leiden mutation who originally presented to [facility] on 12/22 with complaints of left temporal pain associated with blurry vision in the left eye.  Patient additionally is complaining of unsteady gait.  The hospitalist group was then called and the patient was initially hospitalized due to concerns for possible temporal arteritis on the [facility] hospitalist service.  Upon admission both ophthalmology and neurology were consulted.  Initial remote recommendations by ophthalmology was to empirically initiate acyclovir and systemic steroids with cool compresses.  Neurology has additionally evaluated the patient in consultation and followed along.  There has been clinical concern for the possibility of herpes zoster ophthalmicus or even possibly neurosarcoidosis.  To help evaluate for this they have  recommended further evaluation with MRI of the brain with and without contrast as well as MRI imaging of the orbits.  It was determined that patient's ICD is MRI conditional and therefore patient has been transferred to [facility] to obtain this MRI.  While at [facility], hospital course was complicated by atrial fibrillation with rapid ventricular response and bouts of chest discomfort.  Cardiology was additionally consulted.  Invasive ischemic evaluation was not recommended during this hospitalization.  Patient has since converted back to normal sinus rhythm with dosing of intravenous metoprolol.  Hospital course was also complicated by several small bouts of melena.  Patient is a known history of small bowel AVMs.   Hemoglobin hematocrit have been stable and therefore gastroenterology has not been consulted.

## 2022-01-31 NOTE — Assessment & Plan Note (Signed)
No clinical evidence of cardiogenic volume overload Strict input and output monitoring Daily weights Low-sodium diet  

## 2022-01-31 NOTE — Plan of Care (Signed)

## 2022-02-01 DIAGNOSIS — R519 Headache, unspecified: Secondary | ICD-10-CM | POA: Diagnosis not present

## 2022-02-01 LAB — GLUCOSE, CAPILLARY
Glucose-Capillary: 285 mg/dL — ABNORMAL HIGH (ref 70–99)
Glucose-Capillary: 208 mg/dL — ABNORMAL HIGH (ref 70–99)
Glucose-Capillary: 232 mg/dL — ABNORMAL HIGH (ref 70–99)
Glucose-Capillary: 267 mg/dL — ABNORMAL HIGH (ref 70–99)

## 2022-02-01 LAB — TROPONIN I (HIGH SENSITIVITY)
Troponin I (High Sensitivity): 13 ng/L (ref <18)
Troponin I (High Sensitivity): 12 ng/L (ref <18)

## 2022-02-01 LAB — PROTIME-INR
INR: 2.9 — ABNORMAL HIGH (ref 0.8–1.2)
Prothrombin Time: 30.5 seconds — ABNORMAL HIGH (ref 11.4–15.2)

## 2022-02-01 MED ORDER — ZOLPIDEM TARTRATE 5 MG PO TABS
10.0000 mg | ORAL_TABLET | Freq: Every day | ORAL | Status: DC
  Administered 2022-02-01: 10 mg via ORAL
  Filled 2022-02-01: qty 2

## 2022-02-01 MED ORDER — DIPHENHYDRAMINE HCL 50 MG/ML IJ SOLN
25.0000 mg | Freq: Once | INTRAMUSCULAR | Status: AC
  Administered 2022-02-01: 25 mg via INTRAVENOUS
  Filled 2022-02-01: qty 1

## 2022-02-01 MED ORDER — ONDANSETRON HCL 4 MG/2ML IJ SOLN
4.0000 mg | Freq: Four times a day (QID) | INTRAMUSCULAR | Status: DC | PRN
  Administered 2022-02-01: 4 mg via INTRAVENOUS
  Filled 2022-02-01: qty 2

## 2022-02-01 MED ORDER — PROCHLORPERAZINE EDISYLATE 10 MG/2ML IJ SOLN
10.0000 mg | Freq: Once | INTRAMUSCULAR | Status: AC
  Administered 2022-02-01: 10 mg via INTRAVENOUS
  Filled 2022-02-01: qty 2

## 2022-02-01 MED ORDER — WARFARIN SODIUM 4 MG PO TABS
4.0000 mg | ORAL_TABLET | Freq: Once | ORAL | Status: AC
  Administered 2022-02-01: 4 mg via ORAL
  Filled 2022-02-01: qty 1

## 2022-02-01 NOTE — Progress Notes (Signed)
Neurology Progress Note   S:// Patient is very concerned about the behavior of nursing staff overnight.  Feels his concerns were not addressed and he was not heard.  He was seen and examined this morning by me.  We had detailed discussions on his plan of care  Extremely concerned about his sugar levels  O:// Current vital signs: BP (!) 159/82   Pulse 60   Temp 98.4 F (36.9 C) (Oral)   Resp 18   Ht _0  (1.676 m)   Wt 93.9 kg   SpO2 98%   BMI 33.41 kg/m  Vital signs in last 24 hours: Temp:  [97.8 F (36.6 C)-98.4 F (36.9 C)] 98.4 F (36.9 C) (12/30 0346) Pulse Rate:  [58-66] 60 (12/30 0836) Resp:  [18] 18 (12/29 1521) BP: (108-159)/(68-88) 159/82 (12/30 0836) SpO2:  [88 %-98 %] 98 % (12/30 0346) General: Awake alert in no distress HEENT: Normocephalic atraumatic.  Palpable tenderness on multiple areas on the left scalp.  Intact pulses Chest:Clear Cardiovascular: Regular rhythm Abdomen nondistended nontender Neurologic exam Awake alert oriented x 3.  No dysarthria.  No aphasia.  Motor exam with no drift.  Sensation with no deficits.  Coordination with no dysmetria.  Medications  Current Facility-Administered Medications:    0.9 %  sodium chloride infusion, , Intravenous, Continuous, Shalhoub, Sherryll Burger, MD, Stopped at 02/01/22 [phone]   acyclovir (ZOVIRAX) 760 mg in dextrose 5 % 250 mL IVPB, 760 mg, Intravenous, Q8H, Crosley, Debby, MD, Last Rate: 265.2 mL/hr at 02/01/22 0543, 760 mg at 02/01/22 0543   albuterol (PROVENTIL) (2.5 MG/3ML) 0.083% nebulizer solution 2.5 mg, 2.5 mg, Inhalation, Q4H PRN, Claria Dice, Debby, MD   amLODipine (NORVASC) tablet 2.5 mg, 2.5 mg, Oral, BID, Crosley, Debby, MD, 2.5 mg at 02/01/22 8546   ascorbic acid (VITAMIN C) tablet 500 mg, 500 mg, Oral, Daily, Crosley, Debby, MD, 500 mg at 02/01/22 0835   atorvastatin (LIPITOR) tablet 80 mg, 80 mg, Oral, Daily, Crosley, Debby, MD, 80 mg at 02/01/22 0836   clopidogrel (PLAVIX) tablet 75 mg, 75 mg, Oral,  Daily, Crosley, Debby, MD, 75 mg at 02/01/22 2703   cyanocobalamin (VITAMIN B12) tablet 1,000 mcg, 1,000 mcg, Oral, Daily, Crosley, Debby, MD, 1,000 mcg at 02/01/22 0835   dexamethasone (DECADRON) injection 8 mg, 8 mg, Intravenous, Q24H, Crosley, Debby, MD, 8 mg at 01/31/22 0928   finasteride (PROSCAR) tablet 5 mg, 5 mg, Oral, Daily, Crosley, Debby, MD, 5 mg at 02/01/22 0835   gabapentin (NEURONTIN) capsule 800 mg, 800 mg, Oral, TID, Crosley, Debby, MD, 800 mg at 02/01/22 0836   hydrALAZINE (APRESOLINE) injection 10 mg, 10 mg, Intravenous, Q6H PRN, Shalhoub, Sherryll Burger, MD   HYDROcodone-acetaminophen (HYCET) 7.5-325 mg/15 ml solution 10 mL, 10 mL, Oral, Q6H PRN, Crosley, Debby, MD, 10 mL at 02/01/22 0827   insulin aspart (novoLOG) injection 0-20 Units, 0-20 Units, Subcutaneous, TID WC, Hall, Carole N, DO, 11 Units at 02/01/22 0829   insulin aspart (novoLOG) injection 0-5 Units, 0-5 Units, Subcutaneous, QHS, Hall, Carole N, DO, 4 Units at 01/31/22 2227   insulin aspart (novoLOG) injection 15 Units, 15 Units, Subcutaneous, Once, Hall, Carole N, DO   lisinopril (ZESTRIL) tablet 2.5 mg, 2.5 mg, Oral, Daily, Crosley, Debby, MD, 2.5 mg at 02/01/22 0837   magnesium oxide (MAG-OX) tablet 400 mg, 400 mg, Oral, Daily, Crosley, Debby, MD, 400 mg at 02/01/22 0835   metoprolol succinate (TOPROL-XL) 24 hr tablet 100 mg, 100 mg, Oral, Daily, Crosley, Debby, MD, 100 mg at 02/01/22 [phone]  nicotine polacrilex (NICORETTE) gum 4 mg, 4 mg, Oral, PRN, Crosley, Debby, MD, 4 mg at 02/01/22 1123   ondansetron (ZOFRAN) injection 4 mg, 4 mg, Intravenous, Q6H PRN, Adhikari, Amrit, MD, 4 mg at 02/01/22 0827   pantoprazole (PROTONIX) EC tablet 40 mg, 40 mg, Oral, BID, Crosley, Debby, MD, 40 mg at 02/01/22 0836   potassium chloride SA (KLOR-CON M) CR tablet 20 mEq, 20 mEq, Oral, Daily, Crosley, Debby, MD, 20 mEq at 02/01/22 0835   tamsulosin (FLOMAX) capsule 0.4 mg, 0.4 mg, Oral, Daily, Crosley, Debby, MD, 0.4 mg at 02/01/22 8916    warfarin (COUMADIN) tablet 4 mg, 4 mg, Oral, ONCE-1600, Shelly Coss, MD   Warfarin - Pharmacist Dosing Inpatient, , Does not apply, q1600, Alvira Philips, [facility] CBC    Component Value Date/Time   WBC 12.9 (H) 01/31/2022 0651   RBC 4.52 01/31/2022 0651   HGB 12.3 (L) 01/31/2022 0651   HGB 12.3 (L) 09/09/2012 0128   HCT 37.4 (L) 01/31/2022 0651   HCT 38.1 (L) 09/09/2012 0128   PLT 192 01/31/2022 0651   PLT 255 09/09/2012 0128   MCV 82.7 01/31/2022 0651   MCV 67 (L) 09/09/2012 0128   MCH 27.2 01/31/2022 0651   MCHC 32.9 01/31/2022 0651   RDW 14.5 01/31/2022 0651   RDW 21.9 (H) 09/09/2012 0128   LYMPHSABS 1.1 01/31/2022 0651   LYMPHSABS 2.6 06/15/2012 2133   MONOABS 0.8 01/31/2022 0651   MONOABS 0.8 06/15/2012 2133   EOSABS 0.0 01/31/2022 0651   EOSABS 0.1 06/15/2012 2133   BASOSABS 0.0 01/31/2022 0651   BASOSABS 0.1 06/15/2012 2133    CMP     Component Value Date/Time   NA 131 (L) 01/31/2022 0651   NA 138 09/09/2012 0128   K 4.4 01/31/2022 0651   K 3.5 09/09/2012 0128   CL 99 01/31/2022 0651   CL 105 09/09/2012 0128   CO2 24 01/31/2022 0651   CO2 27 09/09/2012 0128   GLUCOSE 395 (H) 01/31/2022 0651   GLUCOSE 141 (H) 09/09/2012 0128   BUN 19 01/31/2022 0651   BUN 9 09/09/2012 0128   CREATININE 1.19 01/31/2022 0651   CREATININE 1.25 09/09/2012 0128   CALCIUM 8.6 (L) 01/31/2022 0651   CALCIUM 8.5 09/09/2012 0128   PROT 7.3 01/23/2016 0055   PROT 7.4 09/09/2012 0128   ALBUMIN 3.8 01/23/2016 0055   ALBUMIN 3.4 09/09/2012 0128   AST 30 01/23/2016 0055   AST 27 09/09/2012 0128   ALT 16 (L) 01/23/2016 0055   ALT 28 09/09/2012 0128   ALKPHOS 67 01/23/2016 0055   ALKPHOS 120 09/09/2012 0128   BILITOT 0.7 01/23/2016 0055   BILITOT 0.3 09/09/2012 0128   GFRNONAA >60 01/31/2022 0651   GFRNONAA >60 09/09/2012 0128   GFRAA >60 11/03/2016 1652   GFRAA >60 09/09/2012 0128    glycosylated hemoglobin  Lipid Panel     Component Value Date/Time   CHOL 126  08/01/2021 0401   CHOL 140 09/10/2012 0132   TRIG 137 08/01/2021 0401   TRIG 281 (H) 09/10/2012 0132   HDL 34 (L) 08/01/2021 0401   HDL 21 (L) 09/10/2012 0132   CHOLHDL 3.7 08/01/2021 0401   VLDL 27 08/01/2021 0401   VLDL 56 (H) 09/10/2012 0132   LDLCALC 65 08/01/2021 0401   LDLCALC 63 09/10/2012 0132     Imaging I have reviewed images in epic and the results pertinent to this consultation are: CT head no acute changes  Assessment:  66 year old  past history of diabetes coronary artery disease TIA A-fib 5 Trifan Lindzen mutation on anticoagulation hypertension pacemaker with multiple revisions with retro-orbital pain and headache on the left.  Initial concern for zoster which has been ruled out by ophthalmological evaluation and also has received IV acyclovir. ESR and CRP not indicative of GCA.  Palpable tenderness on the head and scalp but intact pulses. Discussed in detail with him the risks and benefits of obtaining a biopsy or considering GCA as a diagnosis which would require him to be on steroids.  He is already very unhappy being on steroids with his sugars going up. With pretty sensitive markers already being negative for GCA, I do not think this is GCA. We will treat him as migraine headaches and refer him for outpatient management.  Impression: Headache-likely migrainous GCA not likely  Recommendations: Stop steroids and acyclovir Compazine IV and Benadryl IV x 1 Can start Compazine 5 mg 3 times daily p.o. and Benadryl 25 mg 3 times daily as needed p.o. for headache. I would recommend outpatient follow-up with Dr. Lavell Anchors I would check back on him tomorrow to see how he is doing and likely he will be ready for discharge tomorrow. Will inform the charge nurse about his concerns with the nursing staff Discussed my plan with Dr. Tawanna Solo   -- Amie Portland, MD Neurologist [facility] Pager: [phone]

## 2022-02-01 NOTE — Progress Notes (Signed)
PROGRESS NOTE  [name]  VVZ:[id number] DOB: July 31, 1955 DOA: 01/31/2022 PCP: Lenox Ahr, MD   Brief Narrative:  Patient is a 66 year old male with history of coronary disease status post PCI, cardiac sarcoidosis status post ICD/pacemaker placement, paroxysmal A-fib, hypertension, diabetes type 2, diastolic CHF, hyperlipidemia, factor V leiden mutation, stroke who was initially admitted at [facility] when he presented with left sided headache, blurry vision on the left eye.  Ophthalmology, neurology were consulted.  He was empirically started on acyclovir, systemic steroids for suspicions of HZ opthalmicus / temporal arteritis, there was also suspicion for neurosarcoidosis.  Neurology recommended MRI of the brain and was sent here because patient has ICD.  Hospital course also remarkable for A-fib with RVR for which cardiology was consulted currently in normal sinus rhythm.  After coming to [facility], it was again found that his MRI is incompatible and MRI could not be done.  Ophthalmology, neurology were following here.  Currently there is low suspicion for temporal arteritis or HZ opthalmicus, so acyclovir, steroids discontinued.   Assessment & Plan:  Active Problems:   Left temporal headache   Paroxysmal atrial fibrillation with rapid ventricular response (HCC)   Bright red blood per rectum   Coronary artery disease involving native coronary artery of native heart   Uncontrolled type 2 diabetes mellitus with hyperglycemia, without long-term current use of insulin (HCC)   Chronic diastolic CHF (congestive heart failure) ([place])   Essential hypertension   Mixed diabetic hyperlipidemia associated with type 2 diabetes mellitus ([place])   Automatic implantable cardioverter-defibrillator in situ   Left temporal headache/blurry vision: MRI could not be done due to incompatible ICD.  Still complains of some blurry vision in the left eye, left-sided headache.  Neurology  following.  Ophthalmology was consulted and concluded that the headache is not from his eyes.  No signs of zoster, CRP minimally elevated, ESR normal.  Ophthalmology recommend outpatient follow-up.  He has history of cataract, presbyopia, exotropia. Currently we think that temporal arteritis is extremely unlikely.  Neurology thinks that this could be a migraine.  Being given migraine cocktail.  Paroxysmal A-fib with RVR: Was in RVR at [facility].  Currently in normal sinus rhythm.  Continue oral metoprolol.  Continue Coumadin for anticoagulation.  Rectal bleeding: Had some bright red blood per rectum on toilet paper.  Current hemoglobin stable.  Denies any hematochezia now.  History of small bowel AVMs.  Monitor H&H intermittently.  Chest pain/coronary artery disease: Complain of chest pain today.  Troponin is normal.  EKG without any findings of ischemic changes.  Chest pain is most likely atypical, not cardiac.  Continue supportive care.  History of PCI.  Will recommend to follow-up with cardiology as an outpatient.  Continue Plavix, statin, metoprolol  Prediabetes: Has hyperglycemia most likely secondary to steroids.  hemoglobin A1c 5.7.  Continue sliding scale insulin.  Chronic diastolic CHF: Currently euvolemic.  Hypertension: Currently blood pressure stable.  Continue metoprolol, Norvasc.  History of cardiac pacemaker: Follows with [facility] cardiology.  History of cardiac sarcoidosis.  Status post pacemaker placement.  We recommend to follow-up with [facility] cardiology, electrophysiology as outpatient          DVT prophylaxis: warfarin (COUMADIN) tablet 4 mg     Code Status: Prior  Family Communication: None at bedside  Patient status:Inpatient  Patient is from :home  Anticipated discharge JQ:GBEE  Estimated DC date:tomorrow   Consultants: Neurology, ophthalmology  Procedures: None  Antimicrobials:  Anti-infectives (From admission, onward)  Start     Dose/Rate Route  Frequency Ordered Stop   01/31/22 0600  acyclovir (ZOVIRAX) 760 mg in dextrose 5 % 250 mL IVPB        760 mg 265.2 mL/hr over 60 Minutes Intravenous Every 8 hours 01/31/22 0411         Subjective: Patient seen and examined at the bedside today.  Hemodynamically stable.  Looks comfortable.  On room air.  Sitting at the edge of the bed.  Having some headache on the left side and some blurry vision.  No discharge or eye pain.  No rashes around the eye.  He does not feel ready to go home today.  He complained of chest pain this morning which was already resolved when I evaluated him  Objective: Vitals:   01/31/22 2029 01/31/22 2353 02/01/22 0346 02/01/22 0836  BP: 118/69 125/82 124/88 (!) 159/82  Pulse: (!) 58 63 60 60  Resp:      Temp: 98.2 F (36.8 C) 97.8 F (36.6 C) 98.4 F (36.9 C)   TempSrc: Oral Oral Oral   SpO2: 94% 98% 98%   Weight:      Height:        Intake/Output Summary (Last 24 hours) at 02/01/2022 1116 Last data filed at 02/01/2022 0731 Gross per 24 hour  Intake 2198.8 ml  Output 1000 ml  Net 1198.8 ml   Filed Weights   01/31/22 0459  Weight: 93.9 kg    Examination:  General exam: Overall comfortable, not in distress HEENT: PERRL Respiratory system:  no wheezes or crackles  Cardiovascular system: S1 & S2 heard, RRR.  Gastrointestinal system: Abdomen is nondistended, soft and nontender. Central nervous system: Alert and oriented Extremities: No edema, no clubbing ,no cyanosis Skin: No rashes, no ulcers,no icterus     Data Reviewed: I have personally reviewed following labs and imaging studies  CBC: Recent Labs  Lab 01/26/22 0503 01/29/22 0549 01/31/22 0651  WBC 6.3 15.1* 12.9*  NEUTROABS  --   --  11.0*  HGB 12.7* 11.8* 12.3*  HCT 40.2 37.6* 37.4*  MCV 84.3 84.5 82.7  PLT 165 199 376   Basic Metabolic Panel: Recent Labs  Lab 01/26/22 0503 01/27/22 0549 01/31/22 0651  NA 138  --  131*  K 3.7  --  4.4  CL 108  --  99  CO2 24  --  24   GLUCOSE 125*  --  395*  BUN 11  --  19  CREATININE 0.85 0.98 1.19  CALCIUM 8.4*  --  8.6*  MG 2.1  --   --   PHOS 3.2  --   --      Recent Results (from the past 240 hour(s))  Resp panel by RT-PCR (RSV, Flu A&B, Covid) Anterior Nasal Swab     Status: None   Collection Time: 01/24/22  6:27 PM   Specimen: Anterior Nasal Swab  Result Value Ref Range Status   SARS Coronavirus 2 by RT PCR NEGATIVE NEGATIVE Final    Comment: (NOTE) SARS-CoV-2 target nucleic acids are NOT DETECTED.  The SARS-CoV-2 RNA is generally detectable in upper respiratory specimens during the acute phase of infection. The lowest concentration of SARS-CoV-2 viral copies this assay can detect is 138 copies/mL. A negative result does not preclude SARS-Cov-2 infection and should not be used as the sole basis for treatment or other patient management decisions. A negative result may occur with  improper specimen collection/handling, submission of specimen other than nasopharyngeal swab, presence  of viral mutation(s) within the areas targeted by this assay, and inadequate number of viral copies(<138 copies/mL). A negative result must be combined with clinical observations, patient history, and epidemiological information. The expected result is Negative.  Fact Sheet for Patients:  EntrepreneurPulse.com.au  Fact Sheet for Healthcare Providers:  IncredibleEmployment.be  This test is no t yet approved or cleared by the [place] FDA and  has been authorized for detection and/or diagnosis of SARS-CoV-2 by FDA under an Emergency Use Authorization (EUA). This EUA will remain  in effect (meaning this test can be used) for the duration of the COVID-19 declaration under Section 564(b)(1) of the Act, 21 U.S.C.section 360bbb-3(b)(1), unless the authorization is terminated  or revoked sooner.       Influenza A by PCR NEGATIVE NEGATIVE Final   Influenza B by PCR NEGATIVE NEGATIVE  Final    Comment: (NOTE) The Xpert Xpress SARS-CoV-2/FLU/RSV plus assay is intended as an aid in the diagnosis of influenza from Nasopharyngeal swab specimens and should not be used as a sole basis for treatment. Nasal washings and aspirates are unacceptable for Xpert Xpress SARS-CoV-2/FLU/RSV testing.  Fact Sheet for Patients: EntrepreneurPulse.com.au  Fact Sheet for Healthcare Providers: IncredibleEmployment.be  This test is not yet approved or cleared by the [place] FDA and has been authorized for detection and/or diagnosis of SARS-CoV-2 by FDA under an Emergency Use Authorization (EUA). This EUA will remain in effect (meaning this test can be used) for the duration of the COVID-19 declaration under Section 564(b)(1) of the Act, 21 U.S.C. section 360bbb-3(b)(1), unless the authorization is terminated or revoked.     Resp Syncytial Virus by PCR NEGATIVE NEGATIVE Final    Comment: (NOTE) Fact Sheet for Patients: EntrepreneurPulse.com.au  Fact Sheet for Healthcare Providers: IncredibleEmployment.be  This test is not yet approved or cleared by the [place] FDA and has been authorized for detection and/or diagnosis of SARS-CoV-2 by FDA under an Emergency Use Authorization (EUA). This EUA will remain in effect (meaning this test can be used) for the duration of the COVID-19 declaration under Section 564(b)(1) of the Act, 21 U.S.C. section 360bbb-3(b)(1), unless the authorization is terminated or revoked.  Performed at [facility], [address], [place], [place] [address]      Radiology Studies: No results found.  Scheduled Meds:  amLODipine  2.5 mg Oral BID   ascorbic acid  500 mg Oral Daily   atorvastatin  80 mg Oral Daily   clopidogrel  75 mg Oral Daily   cyanocobalamin  1,000 mcg Oral Daily   dexamethasone  8 mg Intravenous Q24H   diphenhydrAMINE  25 mg Intravenous Once    finasteride  5 mg Oral Daily   gabapentin  800 mg Oral TID   insulin aspart  0-20 Units Subcutaneous TID WC   insulin aspart  0-5 Units Subcutaneous QHS   insulin aspart  15 Units Subcutaneous Once   lisinopril  2.5 mg Oral Daily   magnesium oxide  400 mg Oral Daily   metoprolol succinate  100 mg Oral Daily   pantoprazole  40 mg Oral BID   potassium chloride SA  20 mEq Oral Daily   prochlorperazine  10 mg Intravenous Once   tamsulosin  0.4 mg Oral Daily   warfarin  4 mg Oral ONCE-1600   Warfarin - Pharmacist Dosing Inpatient   Does not apply q1600   Continuous Infusions:  sodium chloride Stopped (02/01/22 0837)   acyclovir (ZOVIRAX) 760 mg in dextrose 5 % 250  mL IVPB 760 mg (02/01/22 0543)     LOS: 1 day   Shelly Coss, MD [facility] P12/30/2023, 11:16 AM

## 2022-02-01 NOTE — Progress Notes (Signed)
ANTICOAGULATION CONSULT NOTE - Follow Up Consult  Pharmacy Consult for Warfarin Indication: atrial fibrillation and factor V Leiden  Allergies  Allergen Reactions   Celecoxib Other (See Comments)    Other reaction(s): GI Bleed, Other - See Comments GI BLEEDING Other Reaction: GI BLEEDING GI bleeding    Ciprofloxacin Anaphylaxis and Other (See Comments)    Other reaction(s): Other - See Comments Pt went into At Fib Other Reaction: PUT PT IN A FIB    Dicyclomine     Other reaction(s): chest pain   Hydromorphone Nausea And Vomiting    Other reaction(s): Vomiting  Nausea and vomiting   Oxycodone-Acetaminophen Nausea And Vomiting    Other reaction(s): Vomiting   Ranolazine Anaphylaxis    Patient describes abdominal swelling/bloating that radiates to neck/throat Patient describes abdominal swelling/bloating that radiates to neck/throat    Rivaroxaban Rash    Other reaction(s): Other (See Comments), Other - See Comments SEVERE RECTAL BLEED Sever rectal bleeding Sever rectal bleeding SEVERE RECTAL BLEED    Ezetimibe Diarrhea    Other reaction(s): cough, Other - See Comments Cough Cough    Pregabalin Other (See Comments)    "weight gain"  Weight gain   Apixaban    Dabigatran Etexilate Mesilate [Dabigatran]    Dabigatran Etexilate Mesylate Other (See Comments)   Dilaudid [Hydromorphone Hcl]    Fentanyl     Nauseated and makes pt jittery   Nauseated and makes pt jittery   Nitroglycerin    Other     Other reaction(s): Hives, Itching Pt states he breaks out in red bumps with adhesive materials (ex: patches)   Prednisone     Doesn't recall reaction but comfortable trying decadron inpatient while monitored. MD aware.    Tramadol     Patient Measurements: Height: 5\' 6"  (167.6 cm) Weight: 93.9 kg (207 lb 0.2 oz) IBW/kg (Calculated) : 63.8  Vital Signs: Temp: 98.4 F (36.9 C) (12/30 0346) Temp Source: Oral (12/30 0346) BP: 124/88 (12/30 0346) Pulse Rate: 60  (12/30 0346)  Labs: Recent Labs    01/30/22 0351 01/31/22 0651 01/31/22 1436 02/01/22 0252  HGB  --  12.3*  --   --   HCT  --  37.4*  --   --   PLT  --  192  --   --   LABPROT 33.8*  --  30.1* 30.5*  INR 3.4*  --  2.9* 2.9*  CREATININE  --  1.19  --   --     Estimated Creatinine Clearance: 67.2 mL/min (by C-G formula based on SCr of 1.19 mg/dL).   Medications:  Scheduled:   amLODipine  2.5 mg Oral BID   ascorbic acid  500 mg Oral Daily   atorvastatin  80 mg Oral Daily   clopidogrel  75 mg Oral Daily   cyanocobalamin  1,000 mcg Oral Daily   dexamethasone  8 mg Intravenous Q24H   finasteride  5 mg Oral Daily   gabapentin  800 mg Oral TID   insulin aspart  0-20 Units Subcutaneous TID WC   insulin aspart  0-5 Units Subcutaneous QHS   insulin aspart  15 Units Subcutaneous Once   lisinopril  2.5 mg Oral Daily   magnesium oxide  400 mg Oral Daily   metoprolol succinate  100 mg Oral Daily   pantoprazole  40 mg Oral BID   potassium chloride SA  20 mEq Oral Daily   tamsulosin  0.4 mg Oral Daily   Warfarin - Pharmacist Dosing Inpatient  Does not apply q1600   Infusions:   sodium chloride 75 mL/hr at 01/31/22 1718   acyclovir (ZOVIRAX) 760 mg in dextrose 5 % 250 mL IVPB 760 mg (02/01/22 0543)    Assessment: 66 yoM admitted [facility] on [facility]/22 with left temporal headache and visual disturbance. 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 He was transferred to [facility] for an MRI that is compatible with his ICD. He takes warfarin PTA for Afib and factor V Leiden. Pharmacy consulted to manage warfarin.   Today INR remains therapeutic at 2.9. Hgb 11-12s, plt wnl. No signs/symptoms of bleeding noted per RN.   Warfarin PTA: 7.5 mg/d; checks INR at home and adjusts dose based on level   Goal of Therapy:  INR 2-3 Monitor platelets by anticoagulation protocol: Yes   Plan:  Warfarin 4 mg PO x 1 tonight.  Daily INR. Monitor CBC and for  signs/symptoms of bleeding.    Vance Peper, PharmD PGY-2 Pharmacy Resident Phone [phone] 02/01/2022 7:40 AM   Please check AMION for all [facility] phone numbers After 10:00 PM, call [place] [phone]

## 2022-02-02 DIAGNOSIS — H5712 Ocular pain, left eye: Secondary | ICD-10-CM | POA: Diagnosis not present

## 2022-02-02 LAB — CBC
HCT: 41.2 % (ref 39.0–52.0)
Hemoglobin: 12.7 g/dL — ABNORMAL LOW (ref 13.0–17.0)
MCH: 26.3 pg (ref 26.0–34.0)
MCHC: 30.8 g/dL (ref 30.0–36.0)
MCV: 85.3 fL (ref 80.0–100.0)
Platelets: 204 10*3/uL (ref 150–400)
RBC: 4.83 MIL/uL (ref 4.22–5.81)
RDW: 14.7 % (ref 11.5–15.5)
WBC: 15.8 10*3/uL — ABNORMAL HIGH (ref 4.0–10.5)
nRBC: 0.1 % (ref 0.0–0.2)

## 2022-02-02 LAB — BASIC METABOLIC PANEL
Anion gap: 8 (ref 5–15)
BUN: 25 mg/dL — ABNORMAL HIGH (ref 8–23)
CO2: 23 mmol/L (ref 22–32)
Calcium: 8.4 mg/dL — ABNORMAL LOW (ref 8.9–10.3)
Chloride: 101 mmol/L (ref 98–111)
Creatinine, Ser: 1.1 mg/dL (ref 0.61–1.24)
GFR, Estimated: >60 mL/min (ref >60)
Glucose, Bld: 283 mg/dL — ABNORMAL HIGH (ref 70–99)
Potassium: 4.5 mmol/L (ref 3.5–5.1)
Sodium: 132 mmol/L — ABNORMAL LOW (ref 135–145)

## 2022-02-02 LAB — PROTIME-INR
INR: 2.6 — ABNORMAL HIGH (ref 0.8–1.2)
Prothrombin Time: 27.3 seconds — ABNORMAL HIGH (ref 11.4–15.2)

## 2022-02-02 LAB — GLUCOSE, CAPILLARY
Glucose-Capillary: 212 mg/dL — ABNORMAL HIGH (ref 70–99)
Glucose-Capillary: 159 mg/dL — ABNORMAL HIGH (ref 70–99)

## 2022-02-02 MED ORDER — WARFARIN SODIUM 5 MG PO TABS: 5.0000 mg | ORAL_TABLET | Freq: Once | ORAL | Status: DC

## 2022-02-02 NOTE — Plan of Care (Signed)
Stop by the patient's room.  Headache seems better Continue the combination of Compazine and Benadryl p.o. as needed Provided information [facility] neurology clinic where he would like to follow-up. Discussed with the hospitalist. Please call neurology with questions as needed  -- Milon Dikes, MD Neurologist [facility] Pager: [phone]

## 2022-02-02 NOTE — Progress Notes (Signed)
ANTICOAGULATION CONSULT NOTE - Follow Up Consult  Pharmacy Consult for Warfarin Indication: atrial fibrillation and factor V Leiden  Allergies  Allergen Reactions   Celecoxib Other (See Comments)    Other reaction(s): GI Bleed, Other - See Comments GI BLEEDING Other Reaction: GI BLEEDING GI bleeding    Ciprofloxacin Anaphylaxis and Other (See Comments)    Other reaction(s): Other - See Comments Pt went into At Fib Other Reaction: PUT PT IN A FIB    Dicyclomine     Other reaction(s): chest pain   Hydromorphone Nausea And Vomiting    Other reaction(s): Vomiting  Nausea and vomiting   Oxycodone-Acetaminophen Nausea And Vomiting    Other reaction(s): Vomiting   Ranolazine Anaphylaxis    Patient describes abdominal swelling/bloating that radiates to neck/throat Patient describes abdominal swelling/bloating that radiates to neck/throat    Rivaroxaban Rash    Other reaction(s): Other (See Comments), Other - See Comments SEVERE RECTAL BLEED Sever rectal bleeding Sever rectal bleeding SEVERE RECTAL BLEED    Ezetimibe Diarrhea    Other reaction(s): cough, Other - See Comments Cough Cough    Pregabalin Other (See Comments)    "weight gain"  Weight gain   Apixaban    Dabigatran Etexilate Mesilate [Dabigatran]    Dabigatran Etexilate Mesylate Other (See Comments)   Dilaudid [Hydromorphone Hcl]    Fentanyl     Nauseated and makes pt jittery   Nauseated and makes pt jittery   Nitroglycerin    Other     Other reaction(s): Hives, Itching Pt states he breaks out in red bumps with adhesive materials (ex: patches)   Prednisone     Doesn't recall reaction but comfortable trying decadron inpatient while monitored. MD aware.    Tramadol     Patient Measurements: Height: 5\' 6"  (167.6 cm) Weight: 93.9 kg (207 lb 0.2 oz) IBW/kg (Calculated) : 63.8  Vital Signs: Temp: 98 F (36.7 C) (12/31 0801) Temp Source: Oral (12/31 0801) BP: 143/80 (12/31 0801) Pulse Rate: 51 (12/31  0801)  Labs: Recent Labs    01/31/22 0651 01/31/22 1436 02/01/22 0252 02/01/22 0746 02/01/22 1015 02/02/22 0226  HGB 12.3*  --   --   --   --  12.7*  HCT 37.4*  --   --   --   --  41.2  PLT 192  --   --   --   --  204  LABPROT  --  30.1* 30.5*  --   --  27.3*  INR  --  2.9* 2.9*  --   --  2.6*  CREATININE 1.19  --   --   --   --  1.10  TROPONINIHS  --   --   --  13 12  --      Estimated Creatinine Clearance: 72.7 mL/min (by C-G formula based on SCr of 1.1 mg/dL).   Medications:  Scheduled:   amLODipine  2.5 mg Oral BID   ascorbic acid  500 mg Oral Daily   atorvastatin  80 mg Oral Daily   clopidogrel  75 mg Oral Daily   cyanocobalamin  1,000 mcg Oral Daily   finasteride  5 mg Oral Daily   gabapentin  800 mg Oral TID   insulin aspart  0-20 Units Subcutaneous TID WC   insulin aspart  0-5 Units Subcutaneous QHS   insulin aspart  15 Units Subcutaneous Once   lisinopril  2.5 mg Oral Daily   magnesium oxide  400 mg Oral Daily   metoprolol  succinate  100 mg Oral Daily   pantoprazole  40 mg Oral BID   potassium chloride SA  20 mEq Oral Daily   tamsulosin  0.4 mg Oral Daily   Warfarin - Pharmacist Dosing Inpatient   Does not apply q1600   zolpidem  10 mg Oral QHS   Infusions:     Assessment: 80 yoM [facility] with left temporal headache and visual disturbance. 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 He was transferred to [facility] for an MRI that is compatible with his ICD. He takes warfarin PTA for Afib and factor V Leiden. Pharmacy consulted to manage warfarin.   Today INR remains therapeutic at 2.6. Hgb 11-12s, plt wnl. No signs/symptoms of bleeding noted.   Warfarin PTA: 7.5 mg/d; checks INR at home and adjusts dose based on level. His prescription and medication reconciliation on admission says 5 mg TR and 7.5 mg all other days.  Goal of Therapy:  INR 2-3 Monitor platelets by anticoagulation protocol: Yes    Plan:  Warfarin 5 mg PO x 1 tonight.  Daily INR. Monitor CBC and for signs/symptoms of bleeding.    March Rummage, PharmD PGY-2 Pharmacy Resident Phone [phone] 02/02/2022 9:00 AM   Please check AMION for all [facility] Pharmacy phone numbers After 10:00 PM, call Main Pharmacy [phone]

## 2022-02-02 NOTE — Discharge Summary (Signed)
Physician Discharge Summary  [name] OQH:[id number] DOB: 11-24-1955 DOA: 01/31/2022  PCP: Lenox Ahr, MD  Admit date: 01/31/2022 Discharge date: 02/02/2022  Admitted From: Home Disposition:  Home  Discharge Condition:Stable CODE STATUS:FULL Diet recommendation: Heart Healthy   Brief/Interim Summary: Patient is a 66 year old male with history of coronary disease status post PCI, cardiac sarcoidosis status post ICD/pacemaker placement, paroxysmal A-fib, hypertension, diabetes type 2, diastolic CHF, hyperlipidemia, factor V leiden mutation, stroke who was initially admitted at [facility] when he presented with left sided headache, blurry vision on the left eye.  Ophthalmology, neurology were consulted.  He was empirically started on acyclovir, systemic steroids for suspicions of HZ opthalmicus / temporal arteritis, there was also suspicion for neurosarcoidosis.  Neurology recommended MRI of the brain and was sent here because patient has ICD.  Hospital course also remarkable for A-fib with RVR for which cardiology was consulted currently in normal sinus rhythm.  After coming to [facility], it was again found that his MRI is incompatible and MRI could not be done.  Ophthalmology, neurology were following here.  Currently there is low suspicion for temporal arteritis or HZ opthalmicus, so acyclovir, steroids discontinued.  Patient is medically stable for discharge home today.  We recommended follow-up with ophthalmology, neurology as an outpatient.  Following problems were addressed during his hospitalization:  Left temporal headache/blurry vision: MRI could not be done due to incompatible ICD.  Still complains of some blurry vision in the left eye, left-sided headache.  Neurology following.  Ophthalmology was consulted and concluded that the headache is not from his eyes.  No signs of zoster, CRP minimally elevated, ESR normal.  Ophthalmology recommend outpatient  follow-up.  He has history of cataract, presbyopia, exotropia. Currently we think that temporal arteritis is extremely unlikely.  Neurology thinks that this could be a migraine. Given migraine cocktail..  Recommend to follow-up with neurology,opthmalmology as outpatient.   Paroxysmal A-fib with RVR: Was in RVR at [facility].  Currently in normal sinus rhythm.  Continue oral metoprolol.  Continue Coumadin for anticoagulation.   Rectal bleeding: Had some bright red blood per rectum on toilet paper.  Current hemoglobin stable.  Denies any hematochezia now.  History of small bowel AVMs.    Chest pain/coronary artery disease: Complain of chest pain today.  Troponin is normal.  EKG without any findings of ischemic changes.  Chest pain is most likely atypical, not cardiac.  Continue supportive care.  History of PCI.  Will recommend to follow-up with cardiology as an outpatient.  Continue Plavix, statin, metoprolol   Prediabetes: Has hyperglycemia most likely secondary to steroids.  hemoglobin A1c 5.7.    Chronic diastolic CHF: Currently euvolemic.   Hypertension: Currently blood pressure stable.  Continue metoprolol, Norvasc.   History of cardiac pacemaker: Follows with [facility] cardiology.  History of cardiac sarcoidosis.  Status post pacemaker placement.  We recommend to follow-up with [facility] cardiology, electrophysiology as outpatient      Discharge Diagnoses:  Principal Problem:   Eye pain, left Active Problems:   Left temporal headache   Paroxysmal atrial fibrillation with rapid ventricular response (HCC)   Bright red blood per rectum   Coronary artery disease involving native coronary artery of native heart   Uncontrolled type 2 diabetes mellitus with hyperglycemia, without long-term current use of insulin (HCC)   Chronic diastolic CHF (congestive heart failure) ([place])   Essential hypertension   Mixed diabetic hyperlipidemia associated with type 2 diabetes mellitus ([place])   Automatic  implantable cardioverter-defibrillator in situ    Discharge Instructions  Discharge Instructions     Diet - low sodium heart healthy   Complete by: As directed    Discharge instructions   Complete by: As directed    1)Please take your medications as instructed 2)Follow up with ophthalmology in a week.  Name and number the provider group has been attached 3)Follow up with your PCP in a week,do  a CBC test to check your white cell count during the follow up 4)Follow up with neurology as an outpatient   Increase activity slowly   Complete by: As directed       Allergies as of 02/02/2022       Reactions   Celecoxib Other (See Comments)   Other reaction(s): GI Bleed, Other - See Comments GI BLEEDING Other Reaction: GI BLEEDING GI bleeding   Ciprofloxacin Anaphylaxis, Other (See Comments)   Other reaction(s): Other - See Comments Pt went into At Fib Other Reaction: PUT PT IN A FIB   Dicyclomine    Other reaction(s): chest pain   Hydromorphone Nausea And Vomiting   Other reaction(s): Vomiting Nausea and vomiting   Oxycodone-acetaminophen Nausea And Vomiting   Other reaction(s): Vomiting   Ranolazine Anaphylaxis   Patient describes abdominal swelling/bloating that radiates to neck/throat Patient describes abdominal swelling/bloating that radiates to neck/throat   Rivaroxaban Rash   Other reaction(s): Other (See Comments), Other - See Comments SEVERE RECTAL BLEED Sever rectal bleeding Sever rectal bleeding SEVERE RECTAL BLEED   Ezetimibe Diarrhea   Other reaction(s): cough, Other - See Comments Cough Cough   Pregabalin Other (See Comments)   "weight gain" Weight gain   Apixaban    Dabigatran Etexilate Mesilate [dabigatran]    Dabigatran Etexilate Mesylate Other (See Comments)   Dilaudid [hydromorphone Hcl]    Fentanyl    Nauseated and makes pt jittery  Nauseated and makes pt jittery   Nitroglycerin    Other    Other reaction(s): Hives, Itching Pt states he  breaks out in red bumps with adhesive materials (ex: patches)   Prednisone    Doesn't recall reaction but comfortable trying decadron inpatient while monitored. MD aware.    Tramadol         Medication List     STOP taking these medications    acyclovir 760 mg in dextrose 5 % 250 mL   dexamethasone 10 MG/ML injection Commonly known as: DECADRON       TAKE these medications    albuterol 108 (90 Base) MCG/ACT inhaler Commonly known as: VENTOLIN HFA Inhale 2 puffs into the lungs every 4 (four) hours as needed.   amLODipine 2.5 MG tablet Commonly known as: NORVASC Take 2.5 mg by mouth 2 (two) times daily.   ascorbic acid 250 MG tablet Commonly known as: VITAMIN C Take 250 mg by mouth daily.   atorvastatin 80 MG tablet Commonly known as: LIPITOR Take 80 mg by mouth daily.   cholecalciferol 10 MCG (400 UNIT) Tabs tablet Commonly known as: VITAMIN D3 Take 400 Units by mouth daily.   clopidogrel 75 MG tablet Commonly known as: PLAVIX Take 75 mg by mouth daily.   cyanocobalamin 1000 MCG tablet Commonly known as: VITAMIN B12 Take 1,000 mcg by mouth daily.   finasteride 5 MG tablet Commonly known as: PROSCAR Take 5 mg by mouth daily.   gabapentin 800 MG tablet Commonly known as: NEURONTIN Take 800 mg by mouth 3 (three) times daily. Take 800 mg by mouth in the morning,  at noon, and at bedtime.   HYDROcodone-acetaminophen 7.5-325 mg/15 ml solution Commonly known as: HYCET Take 7.5 mLs by mouth as directed.   ipratropium-albuterol 0.5-2.5 (3) MG/3ML Soln Commonly known as: DUONEB Take 3 mLs by nebulization every 6 (six) hours.   Klor-Con M20 20 MEQ tablet Generic drug: potassium chloride SA Take 20 mEq by mouth daily.   lisinopril 2.5 MG tablet Commonly known as: ZESTRIL Take 2.5 mg by mouth daily.   magnesium oxide 400 (240 Mg) MG tablet Commonly known as: MAG-OX Take 400 mg by mouth daily.   metoprolol succinate 100 MG 24 hr tablet Commonly known  as: TOPROL-XL Take 100 mg by mouth daily.   nicotine polacrilex 4 MG gum Commonly known as: NICORETTE Take 4 mg by mouth as needed for smoking cessation.   pantoprazole 40 MG tablet Commonly known as: PROTONIX Take 40 mg by mouth 2 (two) times daily.   tadalafil 20 MG tablet Commonly known as: CIALIS Take 20 mg by mouth daily as needed for erectile dysfunction.   tamsulosin 0.4 MG Caps capsule Commonly known as: FLOMAX Take 0.4 mg by mouth daily.   warfarin 7.5 MG tablet Commonly known as: COUMADIN Take 7.5 mg by mouth daily.   zolpidem 10 MG tablet Commonly known as: AMBIEN Take 10 mg by mouth at bedtime.        Follow-up Information     Opthamology, [place]. Schedule an appointment as soon as possible for a visit in 1 week(s).   Specialty: Ophthalmology Contact information: [place] [id number] [phone]                Allergies  Allergen Reactions   Celecoxib Other (See Comments)    Other reaction(s): GI Bleed, Other - See Comments GI BLEEDING Other Reaction: GI BLEEDING GI bleeding    Ciprofloxacin Anaphylaxis and Other (See Comments)    Other reaction(s): Other - See Comments Pt went into At Fib Other Reaction: PUT PT IN A FIB    Dicyclomine     Other reaction(s): chest pain   Hydromorphone Nausea And Vomiting    Other reaction(s): Vomiting  Nausea and vomiting   Oxycodone-Acetaminophen Nausea And Vomiting    Other reaction(s): Vomiting   Ranolazine Anaphylaxis    Patient describes abdominal swelling/bloating that radiates to neck/throat Patient describes abdominal swelling/bloating that radiates to neck/throat    Rivaroxaban Rash    Other reaction(s): Other (See Comments), Other - See Comments SEVERE RECTAL BLEED Sever rectal bleeding Sever rectal bleeding SEVERE RECTAL BLEED    Ezetimibe Diarrhea    Other reaction(s): cough, Other - See Comments Cough Cough    Pregabalin Other (See Comments)     "weight gain"  Weight gain   Apixaban    Dabigatran Etexilate Mesilate [Dabigatran]    Dabigatran Etexilate Mesylate Other (See Comments)   Dilaudid [Hydromorphone Hcl]    Fentanyl     Nauseated and makes pt jittery   Nauseated and makes pt jittery   Nitroglycerin    Other     Other reaction(s): Hives, Itching Pt states he breaks out in red bumps with adhesive materials (ex: patches)   Prednisone     Doesn't recall reaction but comfortable trying decadron inpatient while monitored. MD aware.    Tramadol     Consultations: Neurology,opthalmology   Procedures/Studies: DG Chest Port 1 View  Result Date: 01/26/2022 CLINICAL DATA:  Chest pain EXAM: PORTABLE CHEST 1 VIEW COMPARISON:  01/24/2022 FINDINGS: Stable asymmetric elevation right hemidiaphragm.  The lungs are clear without focal pneumonia, edema, pneumothorax or pleural effusion. Cardiopericardial silhouette is at upper limits of normal for size. Right-sided permanent pacemaker/AICD noted. The visualized bony structures of the thorax are unremarkable. IMPRESSION: Stable exam. No acute cardiopulmonary findings. Electronically Signed   By: Misty Stanley M.D.   On: 01/26/2022 12:15   CT HEAD WO CONTRAST (5MM)  Result Date: 01/25/2022 CLINICAL DATA:  Headache, dizziness, blurred vision left eye EXAM: CT HEAD WITHOUT CONTRAST TECHNIQUE: Contiguous axial images were obtained from the base of the skull through the vertex without intravenous contrast. RADIATION DOSE REDUCTION: This exam was performed according to the departmental dose-optimization program which includes automated exposure control, adjustment of the mA and/or kV according to patient size and/or use of iterative reconstruction technique. COMPARISON:  01/24/2022 FINDINGS: Brain: Stable scattered hypodensities throughout the periventricular and subcortical white matter consistent with chronic small vessel ischemic change. No evidence of acute infarct or hemorrhage. Lateral  ventricles and midline structures are stable. No acute extra-axial fluid collections. No mass effect. Vascular: Stable atherosclerosis.  No hyperdense vessel. Skull: Normal. Negative for fracture or focal lesion. Sinuses/Orbits: No acute finding. Other: None. IMPRESSION: 1. Stable head CT, no acute intracranial process. Electronically Signed   By: Randa Ngo M.D.   On: 01/25/2022 17:49   CT ABDOMEN PELVIS WO CONTRAST  Result Date: 01/25/2022 CLINICAL DATA:  Abdominal pain. EXAM: CT ABDOMEN AND PELVIS WITHOUT CONTRAST TECHNIQUE: Multidetector CT imaging of the abdomen and pelvis was performed following the standard protocol without IV contrast. RADIATION DOSE REDUCTION: This exam was performed according to the departmental dose-optimization program which includes automated exposure control, adjustment of the mA and/or kV according to patient size and/or use of iterative reconstruction technique. COMPARISON:  06/25/2012. FINDINGS: Lower chest: Mild stable scarring or subsegmental atelectasis at the left lung base. Lung bases otherwise clear. Hepatobiliary: Normal liver. Gallbladder decompressed. No bile duct dilation. Pancreas: Unremarkable. No pancreatic ductal dilatation or surrounding inflammatory changes. Spleen: Normal in size without focal abnormality. Adrenals/Urinary Tract: Normal adrenal glands. Kidneys normal in size, orientation and position. 1.2 cm low-attenuation mass from the right kidney upper pole, consistent with a cyst. No follow-up recommended. No other renal masses, no stones and no hydronephrosis. Normal ureters. Bladder decompressed. Stomach/Bowel: Stomach moderately distended, otherwise unremarkable. Small bowel and colon are normal in caliber. No wall thickening. No inflammation. Normal appendix visualized. Vascular/Lymphatic: Aortic atherosclerosis. No aneurysm. No enlarged lymph nodes. Reproductive: Unremarkable. Other: No abdominal wall hernia or abnormality. No abdominopelvic  ascites. Musculoskeletal: No fracture or acute finding.  No bone lesion. IMPRESSION: 1. No acute findings within the abdomen or pelvis. 2. Aortic atherosclerosis. Electronically Signed   By: Lajean Manes M.D.   On: 01/25/2022 12:14   CT ANGIO HEAD NECK W WO CM  Result Date: 01/24/2022 CLINICAL DATA:  Initial evaluation for neuro deficit, stroke suspected, ataxia with blurry vision. EXAM: CT ANGIOGRAPHY HEAD AND NECK TECHNIQUE: Multidetector CT imaging of the head and neck was performed using the standard protocol during bolus administration of intravenous contrast. Multiplanar CT image reconstructions and MIPs were obtained to evaluate the vascular anatomy. Carotid stenosis measurements (when applicable) are obtained utilizing NASCET criteria, using the distal internal carotid diameter as the denominator. RADIATION DOSE REDUCTION: This exam was performed according to the departmental dose-optimization program which includes automated exposure control, adjustment of the mA and/or kV according to patient size and/or use of iterative reconstruction technique. CONTRAST:  20m OMNIPAQUE IOHEXOL 350 MG/ML SOLN COMPARISON:  CT from earlier  the same day. FINDINGS: CTA NECK FINDINGS Aortic arch: Visualized aortic arch normal in caliber with standard branch pattern. Mild atheromatous change about the arch itself. No significant stenosis about the origin the great vessels. Right carotid system: Right common and internal carotid arteries are patent without dissection. Mild atheromatous change about the right carotid bulb without hemodynamically significant greater than 50% stenosis. Left carotid system: Left common and internal carotid arteries are patent without dissection. Mild atheromatous change about the left carotid bulb without hemodynamically significant greater than 50% stenosis. Vertebral arteries: Left vertebral artery arises directly from the aortic arch. Right vertebral artery slightly dominant. Vertebral  arteries patent without stenosis or dissection. Skeleton: No discrete or worrisome osseous lesions. Poor dentition noted. Torus mandibularis noted as well. Other neck: No other acute soft tissue abnormality within the neck. Right-sided Upper chest: Right-sided pacemaker/AICD noted. Upper lobe predominant emphysema. No other acute finding. Review of the MIP images confirms the above findings CTA HEAD FINDINGS Anterior circulation: Atheromatous change within the carotid siphons with up to moderate stenoses bilaterally. A1 segments patent bilaterally. Right A1 hypoplastic. Normal anterior communicating complex. Anterior cerebral arteries patent without significant stenosis. No M1 stenosis or occlusion. No proximal MCA branch occlusion or high-grade stenosis. Distal MCA branches well perfused and symmetric, although demonstrates small vessel atheromatous irregularity. Posterior circulation: Both V4 segments patent without stenosis. Both PICA patent. Basilar patent without stenosis. Superior cerebral arteries patent bilaterally. Both PCAs primarily supplied via the basilar. Atheromatous irregularity within the PCAs without proximal high-grade stenosis. Venous sinuses: Patent allowing for timing the contrast bolus. Anatomic variants: None significant.  No aneurysm. Review of the MIP images confirms the above findings IMPRESSION: 1. Negative CTA for large vessel occlusion or other emergent finding. 2. Atheromatous change about the carotid siphons with up to moderate stenoses bilaterally. 3. Mild atheromatous irregularity elsewhere about the major arterial vasculature of the head and neck. No other hemodynamically significant or correctable stenosis. Aortic Atherosclerosis (ICD10-I70.0) and Emphysema (ICD10-J43.9). Electronically Signed   By: Jeannine Boga M.D.   On: 01/24/2022 22:33   CT Head Wo Contrast  Result Date: 01/24/2022 CLINICAL DATA:  Left-sided head pain/eye pain. EXAM: CT HEAD WITHOUT CONTRAST  TECHNIQUE: Contiguous axial images were obtained from the base of the skull through the vertex without intravenous contrast. RADIATION DOSE REDUCTION: This exam was performed according to the departmental dose-optimization program which includes automated exposure control, adjustment of the mA and/or kV according to patient size and/or use of iterative reconstruction technique. COMPARISON:  11/03/2016 FINDINGS: Brain: No evidence of acute infarction, hemorrhage, hydrocephalus, extra-axial collection or mass lesion/mass effect. Patchy low-density in the cerebral white matter which is chronic and presumed chronic small vessel ischemia. Vascular: No hyperdense vessel or unexpected calcification. Skull: Normal. Negative for fracture or focal lesion. Sinuses/Orbits: No acute finding. IMPRESSION: No acute finding or explanation for symptoms. Electronically Signed   By: Jorje Guild M.D.   On: 01/24/2022 17:12   DG Chest 2 View  Result Date: 01/24/2022 CLINICAL DATA:  Shortness of breath EXAM: CHEST - 2 VIEW COMPARISON:  08/01/2021 FINDINGS: Cardiac size is within normal limits. Possible coronary artery calcifications are seen. There are no signs of pulmonary edema or focal pulmonary consolidation. There is no pleural effusion or pneumothorax. Pacemaker battery is seen in right infraclavicular region with tips of leads in right atrium and right ventricle. IMPRESSION: No active cardiopulmonary disease. Electronically Signed   By: Elmer Picker M.D.   On: 01/24/2022 15:49  Subjective: Patient seen and examined at bedside today.  Hemodynamically stable for discharge.  He has left eye blurriness, left sided headache has improved but still there to some extent.  We discussed about follow-up with neurology and ophthalmology as an outpatient.  Discharge Exam: Vitals:   02/02/22 0022 02/02/22 0801  BP: 121/65 (!) 143/80  Pulse: (!) 57 (!) 51  Resp: 15 17  Temp: 98.2 F (36.8 C) 98 F (36.7 C)   SpO2: 96% 96%   Vitals:   02/01/22 1237 02/01/22 1956 02/02/22 0022 02/02/22 0801  BP: 115/71 118/69 121/65 (!) 143/80  Pulse: (!) 54 (!) 57 (!) 57 (!) 51  Resp: _0 Temp: 98.3 F (36.8 C) 98.3 F (36.8 C) 98.2 F (36.8 C) 98 F (36.7 C)  TempSrc:  Oral Oral Oral  SpO2: 92% 95% 96% 96%  Weight:      Height:        General: Pt is alert, awake, not in acute distress Cardiovascular: RRR, S1/S2 +, no rubs, no gallops Respiratory: CTA bilaterally, no wheezing, no rhonchi Abdominal: Soft, NT, ND, bowel sounds + Extremities: no edema, no cyanosis    The results of significant diagnostics from this hospitalization (including imaging, microbiology, ancillary and laboratory) are listed below for reference.     Microbiology: Recent Results (from the past 240 hour(s))  Resp panel by RT-PCR (RSV, Flu A&B, Covid) Anterior Nasal Swab     Status: None   Collection Time: 01/24/22  6:27 PM   Specimen: Anterior Nasal Swab  Result Value Ref Range Status   SARS Coronavirus 2 by RT PCR NEGATIVE NEGATIVE Final    Comment: (NOTE) SARS-CoV-2 target nucleic acids are NOT DETECTED.  The SARS-CoV-2 RNA is generally detectable in upper respiratory specimens during the acute phase of infection. The lowest concentration of SARS-CoV-2 viral copies this assay can detect is 138 copies/mL. A negative result does not preclude SARS-Cov-2 infection and should not be used as the sole basis for treatment or other patient management decisions. A negative result may occur with  improper specimen collection/handling, submission of specimen other than nasopharyngeal swab, presence of viral mutation(s) within the areas targeted by this assay, and inadequate number of viral copies(<138 copies/mL). A negative result must be combined with clinical observations, patient history, and epidemiological information. The expected result is Negative.  Fact Sheet for Patients:   EntrepreneurPulse.com.au  Fact Sheet for Healthcare Providers:  IncredibleEmployment.be  This test is no t yet approved or cleared by the [place] FDA and  has been authorized for detection and/or diagnosis of SARS-CoV-2 by FDA under an Emergency Use Authorization (EUA). This EUA will remain  in effect (meaning this test can be used) for the duration of the COVID-19 declaration under Section 564(b)(1) of the Act, 21 U.S.C.section 360bbb-3(b)(1), unless the authorization is terminated  or revoked sooner.       Influenza A by PCR NEGATIVE NEGATIVE Final   Influenza B by PCR NEGATIVE NEGATIVE Final    Comment: (NOTE) The Xpert Xpress SARS-CoV-2/FLU/RSV plus assay is intended as an aid in the diagnosis of influenza from Nasopharyngeal swab specimens and should not be used as a sole basis for treatment. Nasal washings and aspirates are unacceptable for Xpert Xpress SARS-CoV-2/FLU/RSV testing.  Fact Sheet for Patients: EntrepreneurPulse.com.au  Fact Sheet for Healthcare Providers: IncredibleEmployment.be  This test is not yet approved or cleared by the [place] FDA and has been authorized for detection and/or diagnosis of SARS-CoV-2 by FDA under  an Emergency Use Authorization (EUA). This EUA will remain in effect (meaning this test can be used) for the duration of the COVID-19 declaration under Section 564(b)(1) of the Act, 21 U.S.C. section 360bbb-3(b)(1), unless the authorization is terminated or revoked.     Resp Syncytial Virus by PCR NEGATIVE NEGATIVE Final    Comment: (NOTE) Fact Sheet for Patients: EntrepreneurPulse.com.au  Fact Sheet for Healthcare Providers: IncredibleEmployment.be  This test is not yet approved or cleared by the [place] FDA and has been authorized for detection and/or diagnosis of SARS-CoV-2 by FDA under an Emergency Use  Authorization (EUA). This EUA will remain in effect (meaning this test can be used) for the duration of the COVID-19 declaration under Section 564(b)(1) of the Act, 21 U.S.C. section 360bbb-3(b)(1), unless the authorization is terminated or revoked.  Performed at [facility], [address] [place], [place] [address]      Labs: BNP (last 3 results) Recent Labs    08/01/21 0103 01/24/22 1459 01/26/22 1032  BNP 29.8 69.3 44.9   Basic Metabolic Panel: Recent Labs  Lab 01/27/22 0549 01/31/22 0651 02/02/22 0226  NA  --  131* 132*  K  --  4.4 4.5  CL  --  99 101  CO2  --  24 23  GLUCOSE  --  395* 283*  BUN  --  19 25*  CREATININE 0.98 1.19 1.10  CALCIUM  --  8.6* 8.4*   Liver Function Tests: No results for input(s): "AST", "ALT", "ALKPHOS", "BILITOT", "PROT", "ALBUMIN" in the last 168 hours. No results for input(s): "LIPASE", "AMYLASE" in the last 168 hours. No results for input(s): "AMMONIA" in the last 168 hours. CBC: Recent Labs  Lab 01/29/22 0549 01/31/22 0651 02/02/22 0226  WBC 15.1* 12.9* 15.8*  NEUTROABS  --  11.0*  --   HGB 11.8* 12.3* 12.7*  HCT 37.6* 37.4* 41.2  MCV 84.5 82.7 85.3  PLT 199 192 204   Cardiac Enzymes: No results for input(s): "CKTOTAL", "CKMB", "CKMBINDEX", "TROPONINI" in the last 168 hours. BNP: Invalid input(s): "POCBNP" CBG: Recent Labs  Lab 02/01/22 0643 02/01/22 1236 02/01/22 1615 02/01/22 2149 02/02/22 0655  GLUCAP 285* 208* 232* 267* 212*   D-Dimer No results for input(s): "DDIMER" in the last 72 hours. Hgb A1c Recent Labs    01/31/22 0651  HGBA1C 5.7*   Lipid Profile No results for input(s): "CHOL", "HDL", "LDLCALC", "TRIG", "CHOLHDL", "LDLDIRECT" in the last 72 hours. Thyroid function studies No results for input(s): "TSH", "T4TOTAL", "T3FREE", "THYROIDAB" in the last 72 hours.  Invalid input(s): "FREET3" Anemia work up No results for input(s): "VITAMINB12", "FOLATE", "FERRITIN", "TIBC", "IRON",  "RETICCTPCT" in the last 72 hours. Urinalysis    Component Value Date/Time   COLORURINE STRAW (A) 01/24/2022 1827   APPEARANCEUR CLEAR (A) 01/24/2022 1827   APPEARANCEUR Clear 06/25/2012 1147   LABSPEC 1.006 01/24/2022 1827   LABSPEC 1.002 06/25/2012 1147   PHURINE 6.0 01/24/2022 1827   GLUCOSEU NEGATIVE 01/24/2022 1827   GLUCOSEU Negative 06/25/2012 1147   HGBUR NEGATIVE 01/24/2022 1827   BILIRUBINUR NEGATIVE 01/24/2022 1827   BILIRUBINUR Negative 06/25/2012 [place] 01/24/2022 1827   PROTEINUR NEGATIVE 01/24/2022 1827   UROBILINOGEN 0.2 05/27/2007 1525   NITRITE NEGATIVE 01/24/2022 1827   LEUKOCYTESUR NEGATIVE 01/24/2022 1827   LEUKOCYTESUR Negative 06/25/2012 1147   Sepsis Labs Recent Labs  Lab 01/29/22 0549 01/31/22 0651 02/02/22 0226  WBC 15.1* 12.9* 15.8*   Microbiology Recent Results (from the past 240 hour(s))  Resp panel by RT-PCR (RSV, Flu A&B, Covid) Anterior Nasal Swab     Status: None   Collection Time: 01/24/22  6:27 PM   Specimen: Anterior Nasal Swab  Result Value Ref Range Status   SARS Coronavirus 2 by RT PCR NEGATIVE NEGATIVE Final    Comment: (NOTE) SARS-CoV-2 target nucleic acids are NOT DETECTED.  The SARS-CoV-2 RNA is generally detectable in upper respiratory specimens during the acute phase of infection. The lowest concentration of SARS-CoV-2 viral copies this assay can detect is 138 copies/mL. A negative result does not preclude SARS-Cov-2 infection and should not be used as the sole basis for treatment or other patient management decisions. A negative result may occur with  improper specimen collection/handling, submission of specimen other than nasopharyngeal swab, presence of viral mutation(s) within the areas targeted by this assay, and inadequate number of viral copies(<138 copies/mL). A negative result must be combined with clinical observations, patient history, and epidemiological information. The expected result is  Negative.  Fact Sheet for Patients:  EntrepreneurPulse.com.au  Fact Sheet for Healthcare Providers:  IncredibleEmployment.be  This test is no t yet approved or cleared by the [place] FDA and  has been authorized for detection and/or diagnosis of SARS-CoV-2 by FDA under an Emergency Use Authorization (EUA). This EUA will remain  in effect (meaning this test can be used) for the duration of the COVID-19 declaration under Section 564(b)(1) of the Act, 21 U.S.C.section 360bbb-3(b)(1), unless the authorization is terminated  or revoked sooner.       Influenza A by PCR NEGATIVE NEGATIVE Final   Influenza B by PCR NEGATIVE NEGATIVE Final    Comment: (NOTE) The Xpert Xpress SARS-CoV-2/FLU/RSV plus assay is intended as an aid in the diagnosis of influenza from Nasopharyngeal swab specimens and should not be used as a sole basis for treatment. Nasal washings and aspirates are unacceptable for Xpert Xpress SARS-CoV-2/FLU/RSV testing.  Fact Sheet for Patients: EntrepreneurPulse.com.au  Fact Sheet for Healthcare Providers: IncredibleEmployment.be  This test is not yet approved or cleared by the [place] FDA and has been authorized for detection and/or diagnosis of SARS-CoV-2 by FDA under an Emergency Use Authorization (EUA). This EUA will remain in effect (meaning this test can be used) for the duration of the COVID-19 declaration under Section 564(b)(1) of the Act, 21 U.S.C. section 360bbb-3(b)(1), unless the authorization is terminated or revoked.     Resp Syncytial Virus by PCR NEGATIVE NEGATIVE Final    Comment: (NOTE) Fact Sheet for Patients: EntrepreneurPulse.com.au  Fact Sheet for Healthcare Providers: IncredibleEmployment.be  This test is not yet approved or cleared by the [place] FDA and has been authorized for detection and/or diagnosis of  SARS-CoV-2 by FDA under an Emergency Use Authorization (EUA). This EUA will remain in effect (meaning this test can be used) for the duration of the COVID-19 declaration under Section 564(b)(1) of the Act, 21 U.S.C. section 360bbb-3(b)(1), unless the authorization is terminated or revoked.  Performed at [facility], [address] [place], [place] [address]     Please note: You were cared for by a hospitalist during your hospital stay. Once you are discharged, your primary care physician will handle any further medical issues. Please note that NO REFILLS for any discharge medications will be authorized once you are discharged, as it is imperative that you return to your primary care physician (or establish a relationship with a primary care physician if you do not have one) for your post hospital discharge needs so that  they can reassess your need for medications and monitor your lab values.    Time coordinating discharge: 40 minutes  SIGNED:   Shelly Coss, MD  [facility] 02/02/2022, 11:07 AM Pager [phone]  If 7PM-7AM, please contact night-coverage [email or web address] Password [email or web address]

## 2022-02-02 NOTE — Plan of Care (Signed)

## 2022-02-07 ENCOUNTER — Other Ambulatory Visit: Payer: Self-pay

## 2022-02-07 ENCOUNTER — Emergency Department
Admission: EM | Admit: 2022-02-07 | Discharge: 2022-02-07 | Discharge status: Against Medical Advice | Payer: Medicare HMO | Attending: Emergency Medicine | Admitting: Emergency Medicine

## 2022-02-07 ENCOUNTER — Emergency Department: Payer: Medicare HMO

## 2022-02-07 DIAGNOSIS — R61 Generalized hyperhidrosis: Secondary | ICD-10-CM | POA: Diagnosis not present

## 2022-02-07 DIAGNOSIS — Z955 Presence of coronary angioplasty implant and graft: Secondary | ICD-10-CM | POA: Diagnosis not present

## 2022-02-07 DIAGNOSIS — R0602 Shortness of breath: Secondary | ICD-10-CM | POA: Diagnosis not present

## 2022-02-07 DIAGNOSIS — Z7901 Long term (current) use of anticoagulants: Secondary | ICD-10-CM | POA: Diagnosis not present

## 2022-02-07 DIAGNOSIS — R079 Chest pain, unspecified: Secondary | ICD-10-CM | POA: Diagnosis present

## 2022-02-07 DIAGNOSIS — Z5321 Procedure and treatment not carried out due to patient leaving prior to being seen by health care provider: Secondary | ICD-10-CM | POA: Diagnosis not present

## 2022-02-07 LAB — CBC
HCT: 43.4 % (ref 39.0–52.0)
Hemoglobin: 13.1 g/dL (ref 13.0–17.0)
MCH: 26.2 pg (ref 26.0–34.0)
MCHC: 30.2 g/dL (ref 30.0–36.0)
MCV: 86.8 fL (ref 80.0–100.0)
Platelets: 156 10*3/uL (ref 150–400)
RBC: 5 MIL/uL (ref 4.22–5.81)
RDW: 15.3 % (ref 11.5–15.5)
WBC: 8.2 10*3/uL (ref 4.0–10.5)
nRBC: 0 % (ref 0.0–0.2)

## 2022-02-07 LAB — BASIC METABOLIC PANEL
Anion gap: 8 (ref 5–15)
BUN: 15 mg/dL (ref 8–23)
CO2: 19 mmol/L — ABNORMAL LOW (ref 22–32)
Calcium: 8 mg/dL — ABNORMAL LOW (ref 8.9–10.3)
Chloride: 106 mmol/L (ref 98–111)
Creatinine, Ser: 0.91 mg/dL (ref 0.61–1.24)
GFR, Estimated: >60 mL/min (ref >60)
Glucose, Bld: 179 mg/dL — ABNORMAL HIGH (ref 70–99)
Potassium: 3.7 mmol/L (ref 3.5–5.1)
Sodium: 133 mmol/L — ABNORMAL LOW (ref 135–145)

## 2022-02-07 LAB — TROPONIN I (HIGH SENSITIVITY)
Troponin I (High Sensitivity): 7 ng/L (ref <18)
Troponin I (High Sensitivity): 7 ng/L (ref <18)

## 2022-02-07 NOTE — ED Provider Triage Note (Signed)
  Emergency Medicine Provider Triage Evaluation Note  [name] , a 67 y.o.male,  was evaluated in triage.  Pt complains of chest pain.  Patient states that he has been experiencing chest pain that radiates to his back that started last night.  Additionally reports shortness of breath and night sweats.  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   Review of Systems  Positive: Chest pain, shortness of breath, diaphoresis Negative: Denies fever, abdominal pain, vomiting  Physical Exam   Vitals:   02/07/22 1412 02/07/22 1804  BP: 129/73 133/77  Pulse: 77 67  Resp: 18 18  Temp: 98.6 F (37 C) 98 F (36.7 C)  SpO2: 98% 98%   Gen:   Awake, no distress   Resp:  Normal effort  MSK:   Moves extremities without difficulty  Other:    Medical Decision Making  Given the patient's initial medical screening exam, the following diagnostic evaluation has been ordered. The patient will be placed in the appropriate treatment space, once one is available, to complete the evaluation and treatment. I have discussed the plan of care with the patient and I have advised the patient that an ED physician or mid-level practitioner will reevaluate their condition after the test results have been received, as the results may give them additional insight into the type of treatment they may need.    Diagnostics: Labs, EKG, CXR  Treatments: none immediately   Teodoro Spray, [place] 02/07/22 1809

## 2022-02-07 NOTE — ED Triage Notes (Signed)
Pt to ED via POV from home. Pt reports centralized CP that radiates to back that started last night. Pt also reports increased SOB and night sweats. Pt reports hx MI with multiple stents placed. Pt is on blood thinner (Warfarin).
